# Patient Record
Sex: Female | Born: 1935 | Race: White | Hispanic: No | Smoking: Never smoker
Health system: Southern US, Community
[De-identification: ages and names within clinical notes are randomized; demographics above are authoritative.]

## PROBLEM LIST (undated history)

## (undated) DIAGNOSIS — I160 Hypertensive urgency: Secondary | ICD-10-CM

## (undated) DIAGNOSIS — I350 Nonrheumatic aortic (valve) stenosis: Secondary | ICD-10-CM

## (undated) DIAGNOSIS — R011 Cardiac murmur, unspecified: Secondary | ICD-10-CM

## (undated) DIAGNOSIS — E876 Hypokalemia: Secondary | ICD-10-CM

## (undated) DIAGNOSIS — N390 Urinary tract infection, site not specified: Secondary | ICD-10-CM

## (undated) DIAGNOSIS — S72012A Unspecified intracapsular fracture of left femur, initial encounter for closed fracture: Secondary | ICD-10-CM

## (undated) DIAGNOSIS — E43 Unspecified severe protein-calorie malnutrition: Secondary | ICD-10-CM

## (undated) DIAGNOSIS — I10 Essential (primary) hypertension: Secondary | ICD-10-CM

## (undated) HISTORY — DX: Cardiac murmur, unspecified: R01.1

## (undated) HISTORY — DX: Hypokalemia: E87.6

## (undated) HISTORY — DX: Unspecified severe protein-calorie malnutrition: E43

## (undated) HISTORY — DX: Hypertensive urgency: I16.0

## (undated) HISTORY — DX: Unspecified intracapsular fracture of left femur, initial encounter for closed fracture: S72.012A

---

## 1999-06-29 ENCOUNTER — Inpatient Hospital Stay (HOSPITAL_COMMUNITY): Admission: EM | Admit: 1999-06-29 | Discharge: 1999-07-01 | Payer: Self-pay | Admitting: Emergency Medicine

## 1999-06-29 ENCOUNTER — Encounter: Payer: Self-pay | Admitting: Emergency Medicine

## 1999-06-30 ENCOUNTER — Encounter: Payer: Self-pay | Admitting: Family Medicine

## 1999-07-01 ENCOUNTER — Encounter: Payer: Self-pay | Admitting: Internal Medicine

## 2002-10-02 ENCOUNTER — Encounter: Payer: Self-pay | Admitting: Emergency Medicine

## 2002-10-02 ENCOUNTER — Emergency Department (HOSPITAL_COMMUNITY): Admission: EM | Admit: 2002-10-02 | Discharge: 2002-10-02 | Payer: Self-pay | Admitting: Emergency Medicine

## 2004-01-26 ENCOUNTER — Emergency Department (HOSPITAL_COMMUNITY): Admission: EM | Admit: 2004-01-26 | Discharge: 2004-01-26 | Payer: Self-pay | Admitting: Emergency Medicine

## 2004-01-28 ENCOUNTER — Emergency Department (HOSPITAL_COMMUNITY): Admission: EM | Admit: 2004-01-28 | Discharge: 2004-01-28 | Payer: Self-pay | Admitting: Emergency Medicine

## 2004-02-04 ENCOUNTER — Emergency Department (HOSPITAL_COMMUNITY): Admission: EM | Admit: 2004-02-04 | Discharge: 2004-02-04 | Payer: Self-pay | Admitting: Emergency Medicine

## 2004-05-17 ENCOUNTER — Encounter: Admission: RE | Admit: 2004-05-17 | Discharge: 2004-05-17 | Payer: Self-pay | Admitting: Family Medicine

## 2007-08-05 ENCOUNTER — Emergency Department (HOSPITAL_COMMUNITY): Admission: EM | Admit: 2007-08-05 | Discharge: 2007-08-05 | Payer: Self-pay | Admitting: Emergency Medicine

## 2010-05-01 ENCOUNTER — Emergency Department (HOSPITAL_COMMUNITY): Admission: EM | Admit: 2010-05-01 | Discharge: 2010-05-02 | Payer: Self-pay | Admitting: Emergency Medicine

## 2010-11-28 ENCOUNTER — Encounter: Payer: Self-pay | Admitting: Family Medicine

## 2011-01-23 LAB — URINALYSIS, ROUTINE W REFLEX MICROSCOPIC
Ketones, ur: NEGATIVE mg/dL
Nitrite: NEGATIVE
Protein, ur: NEGATIVE mg/dL
Specific Gravity, Urine: 1.005 (ref 1.005–1.030)
pH: 7.5 (ref 5.0–8.0)

## 2011-01-23 LAB — RAPID URINE DRUG SCREEN, HOSP PERFORMED
Amphetamines: NOT DETECTED
Barbiturates: NOT DETECTED
Cocaine: NOT DETECTED
Opiates: NOT DETECTED

## 2011-01-23 LAB — URINE CULTURE

## 2011-01-23 LAB — URINE MICROSCOPIC-ADD ON: Urine-Other: NONE SEEN

## 2011-11-10 DIAGNOSIS — N3000 Acute cystitis without hematuria: Secondary | ICD-10-CM | POA: Diagnosis not present

## 2012-05-28 DIAGNOSIS — I10 Essential (primary) hypertension: Secondary | ICD-10-CM | POA: Diagnosis not present

## 2012-05-28 DIAGNOSIS — R300 Dysuria: Secondary | ICD-10-CM | POA: Diagnosis not present

## 2012-06-06 DIAGNOSIS — R350 Frequency of micturition: Secondary | ICD-10-CM | POA: Diagnosis not present

## 2012-06-06 DIAGNOSIS — R1013 Epigastric pain: Secondary | ICD-10-CM | POA: Diagnosis not present

## 2012-06-20 ENCOUNTER — Emergency Department (HOSPITAL_COMMUNITY)
Admission: EM | Admit: 2012-06-20 | Discharge: 2012-06-20 | Disposition: A | Discharge status: Home / Self Care | Payer: Medicare Other | Attending: Emergency Medicine | Admitting: Emergency Medicine

## 2012-06-20 ENCOUNTER — Emergency Department (HOSPITAL_COMMUNITY): Payer: Medicare Other

## 2012-06-20 ENCOUNTER — Encounter (HOSPITAL_COMMUNITY): Payer: Self-pay | Admitting: Emergency Medicine

## 2012-06-20 DIAGNOSIS — IMO0002 Reserved for concepts with insufficient information to code with codable children: Secondary | ICD-10-CM | POA: Insufficient documentation

## 2012-06-20 DIAGNOSIS — M25569 Pain in unspecified knee: Secondary | ICD-10-CM | POA: Diagnosis not present

## 2012-06-20 DIAGNOSIS — W010XXA Fall on same level from slipping, tripping and stumbling without subsequent striking against object, initial encounter: Secondary | ICD-10-CM | POA: Insufficient documentation

## 2012-06-20 DIAGNOSIS — S82009A Unspecified fracture of unspecified patella, initial encounter for closed fracture: Secondary | ICD-10-CM | POA: Diagnosis not present

## 2012-06-20 DIAGNOSIS — T07XXXA Unspecified multiple injuries, initial encounter: Secondary | ICD-10-CM

## 2012-06-20 DIAGNOSIS — M25462 Effusion, left knee: Secondary | ICD-10-CM

## 2012-06-20 DIAGNOSIS — M7989 Other specified soft tissue disorders: Secondary | ICD-10-CM | POA: Diagnosis not present

## 2012-06-20 DIAGNOSIS — I10 Essential (primary) hypertension: Secondary | ICD-10-CM | POA: Diagnosis not present

## 2012-06-20 DIAGNOSIS — Y9301 Activity, walking, marching and hiking: Secondary | ICD-10-CM | POA: Insufficient documentation

## 2012-06-20 DIAGNOSIS — Y998 Other external cause status: Secondary | ICD-10-CM | POA: Insufficient documentation

## 2012-06-20 DIAGNOSIS — M25469 Effusion, unspecified knee: Secondary | ICD-10-CM | POA: Diagnosis not present

## 2012-06-20 DIAGNOSIS — R6889 Other general symptoms and signs: Secondary | ICD-10-CM | POA: Diagnosis not present

## 2012-06-20 HISTORY — DX: Essential (primary) hypertension: I10

## 2012-06-20 MED ORDER — LIDOCAINE HCL (PF) 1 % IJ SOLN
5.0000 mL | Freq: Once | INTRAMUSCULAR | Status: AC
  Administered 2012-06-20: 5 mL
  Filled 2012-06-20: qty 5

## 2012-06-20 MED ORDER — ACETAMINOPHEN 325 MG PO TABS
650.0000 mg | ORAL_TABLET | Freq: Once | ORAL | Status: AC
  Administered 2012-06-20: 650 mg via ORAL
  Filled 2012-06-20: qty 2

## 2012-06-20 MED ORDER — HYDROCODONE-ACETAMINOPHEN 5-325 MG PO TABS: 1.0000 | ORAL_TABLET | ORAL | Status: DC | PRN

## 2012-06-20 MED ORDER — DOCUSATE SODIUM 100 MG PO CAPS: 100.0000 mg | ORAL_CAPSULE | Freq: Two times a day (BID) | ORAL | Status: AC

## 2012-06-20 NOTE — Discharge Instructions (Signed)
 Knee Effusion The medical term for having fluid in your knee is effusion. This is often due to an internal derangement of the knee. This means something is wrong inside the knee. Some of the causes of fluid in the knee may be torn cartilage, a torn ligament, or bleeding into the joint from an injury. Your knee is likely more difficult to bend and move. This is often because there is increased pain and pressure in the joint. The time it takes for recovery from a knee effusion depends on different factors, including:   Type of injury.   Your age.   Physical and medical conditions.   Rehabilitation Strategies.  How long you will be away from your normal activities will depend on what kind of knee problem you have and how much damage is present. Your knee has two types of cartilage. Articular cartilage covers the bone ends and lets your knee bend and move smoothly. Two menisci, thick pads of cartilage that form a rim inside the joint, help absorb shock and stabilize your knee. Ligaments bind the bones together and support your knee joint. Muscles move the joint, help support your knee, and take stress off the joint itself. CAUSES  Often an effusion in the knee is caused by an injury to one of the menisci. This is often a tear in the cartilage. Recovery after a meniscus injury depends on how much meniscus is damaged and whether you have damaged other knee tissue. Small tears may heal on their own with conservative treatment. Conservative means rest, limited weight bearing activity and muscle strengthening exercises. Your recovery may take up to 6 weeks.  TREATMENT  Larger tears may require surgery. Meniscus injuries may be treated during arthroscopy. Arthroscopy is a procedure in which your surgeon uses a small telescope like instrument to look in your knee. Your caregiver can make a more accurate diagnosis (learning what is wrong) by performing an arthroscopic procedure. If your injury is on the inner  margin of the meniscus, your surgeon may trim the meniscus back to a smooth rim. In other cases your surgeon will try to repair a damaged meniscus with stitches (sutures). This may make rehabilitation take longer, but may provide better long term result by helping your knee keep its shock absorption capabilities. Ligaments which are completely torn usually require surgery for repair. HOME CARE INSTRUCTIONS  Use crutches as instructed.   If a brace is applied, use as directed.   Once you are home, an ice pack applied to your swollen knee may help with discomfort and help decrease swelling.   Keep your knee raised (elevated) when you are not up and around or on crutches.   Only take over-the-counter or prescription medicines for pain, discomfort, or fever as directed by your caregiver.   Your caregivers will help with instructions for rehabilitation of your knee. This often includes strengthening exercises.   You may resume a normal diet and activities as directed.  SEEK MEDICAL CARE IF:   There is increased swelling in your knee.   You notice redness, swelling, or increasing pain in your knee.   An unexplained oral temperature above 102 F (38.9 C) develops.  SEEK IMMEDIATE MEDICAL CARE IF:   You develop a rash.   You have difficulty breathing.   You have any allergic reactions from medications you may have been given.   There is severe pain with any motion of the knee.  MAKE SURE YOU:   Understand these instructions.  Will watch your condition.   Will get help right away if you are not doing well or get worse.  Document Released: 01/14/2004 Document Revised: 10/13/2011 Document Reviewed: 03/19/2008 [facility] Patient Information 2012 [place], [place].   Patellar Fracture, Adult A patellar fracture is a break in the little round bone (kneecap) that is the bump on the front of your knee. A direct blow to the knee, or fall, is usually the cause of a broken patella. Sometimes,  a very hard and strong bending of the knee (like jumping events in sports) can cause a fracture. Usually the knee is tender and swollen and has pain with motion, especially trying to straighten out the leg. There may be difficulty walking or putting weight on the affected side. These fractures generally heal in about 4 to 6 weeks. DIAGNOSIS  The diagnosis is usually easily made with an exam and x-ray. TREATMENT  Treatment is dependent on the type of fracture:  If the fracture is undisplaced or only slightly displaced (this means the position of the parts is good) then, after any blood in the joint has been removed, and if you can still straighten your leg out, you can usually be treated with a splint or cast for 4 to 6 weeks. Straightening out your leg is called extension and the ability to do this is with the extensor mechanism. Every day while in treatment quadriceps exercises should be performed, or as directed by your caregivers.   If there is a stellate (comminuted) fracture of the patella (this means the patella is in multiple small pieces), the blood in the joint may be removed and if you are able to straighten your leg, you can usually be treated with a splint or cast for 4 to 6 weeks. Sometimes this type of fracture may be treated by removing the patella. You will still have a good knee without a patella. If this is done the knee still will need to be in a plaster cast or splint for the next 4 to 6 weeks.   If the fracture is a displaced transverse fracture and you cannot extend (straighten out your leg), then an operation is required to hold the bony fragments together until they heal. Again a plaster cast or splint is worn until the extension mechanism of the knee is regained. This means the knee is healed and you can straighten out your leg again normally.   Only take over-the-counter or prescription medicines for pain, discomfort, or fever as directed by your caregiver.   Warning: Do not  drive a car or operate a motor vehicle until your caregiver specifically tells you it is safe to do so.  HOME CARE INSTRUCTIONS   You may resume normal diet and activities as directed or allowed.   Keep ice packs (a bag of ice wrapped in a towel) on the knee for twenty minutes, four times per day, for the first two days.   Change dressings if necessary or as directed.   Only take over-the-counter or prescription medicines for pain, discomfort, or fever as directed by your caregiver.   Use crutches as directed and exercise leg as directed.   Keep appointments as directed.  SEEK IMMEDIATE MEDICAL CARE IF :  Redness, swelling, or increasing pain in the knee.   An unexplained oral temperature above 102 F (38.9 C) develops.  Document Released: 07/23/2003 Document Revised: 10/13/2011 Document Reviewed: 06/06/2008 [facility] Patient Information 2012 [place], [place].    Narcotic and benzodiazepine use may cause drowsiness,  slowed breathing or dependence.  Please use with caution and do not drive, operate machinery or watch young children alone while taking them.  Taking combinations of these medications or drinking alcohol will potentiate these effects.  Also constipation can be a problem so be sure to drink water and use stool softeners or laxatives to prevent constipation.

## 2012-06-20 NOTE — ED Provider Notes (Signed)
History   This chart was scribed for Jamie Carlson. Oletta Lamas, MD by Charolett Bumpers . The patient was seen in room TR04C/TR04C. Patient's care was started at 1406.    CSN: [id number]  Arrival date & time 06/20/12  1351   First MD Initiated Contact with Patient 06/20/12 1406      Chief Complaint  Patient presents with  . Fall    (Consider location/radiation/quality/duration/timing/severity/associated sxs/prior treatment) HPI Jamie Carlson is a 78 y.o. female who presents to the Emergency Department complaining of constant, moderate left knee pain with associated swelling after a fall that occurred this morning. Pt reports that she was walking when she tripped over a curb, falling on both knees. Pt reports associated abrasions to hands and knees. Pt denies any LOC. Pt reports that she was able to ambulate after the fall, walked back home when a family friend called EMS. Pt has applied ice packs with minimal relief. Pt denies taking any anticoagulants. Pt has a h/o HTN and reports taking 4 aspirin since this morning.   Past Medical History  Diagnosis Date  . Hypertension     History reviewed. No pertinent past surgical history.  History reviewed. No pertinent family history.  History  Substance Use Topics  . Smoking status: Never Smoker   . Smokeless tobacco: Not on file  . Alcohol Use: No    OB History    Grav Para Term Preterm Abortions TAB SAB Ect Mult Living                  Review of Systems  Constitutional: Negative for fever and chills.  Respiratory: Negative for shortness of breath.   Gastrointestinal: Negative for nausea and vomiting.  Musculoskeletal: Positive for joint swelling and arthralgias.       Left knee pain and swelling.   Skin: Positive for wound.       Abrasions to hands and knees.   Neurological: Negative for weakness.  All other systems reviewed and are negative.    Allergies  Sulfa antibiotics  Home Medications   Current Outpatient Rx    Name Route Sig Dispense Refill  . ASPIRIN EC 81 MG PO TBEC Oral Take 324 mg by mouth daily as needed. For pain    . DOCUSATE SODIUM 100 MG PO CAPS Oral Take 1 capsule (100 mg total) by mouth 2 (two) times daily. 40 capsule 0  . HYDROCODONE-ACETAMINOPHEN 5-325 MG PO TABS Oral Take 1 tablet by mouth every 4 (four) hours as needed for pain. 1-2 tablets po q 6 hours prn moderate to severe pain 20 tablet 0    BP 188/96  Pulse 84  Temp 97 F (36.1 C) (Oral)  Resp 16  SpO2 100%  Physical Exam  Nursing note and vitals reviewed. Constitutional: She is oriented to person, place, and time. She appears well-developed and well-nourished. No distress.  HENT:  Head: Normocephalic and atraumatic.  Eyes: EOM are normal.  Neck: Neck supple. No tracheal deviation present.  Cardiovascular: Normal rate.   Pulmonary/Chest: Effort normal. No respiratory distress.  Musculoskeletal: Normal range of motion. She exhibits edema and tenderness.       Liimited ROM and swelling of left knee with tenderness noted.   Neurological: She is alert and oriented to person, place, and time.  Skin: Skin is warm and dry.       Superficial, small abrasions to hands bilaterally and bilaterally knees. No active bleeding. Band-Aid already applied to left hand.   Psychiatric: She  has a normal mood and affect. Her behavior is normal.    ED Course  ARTHOCENTESIS Date/Time: 06/20/2012 5:16 PM Performed by: Lear Ng. Authorized by: Lear Ng Consent: Verbal consent obtained. Risks and benefits: risks, benefits and alternatives were discussed Patient understanding: patient states understanding of the procedure being performed Patient consent: the patient's understanding of the procedure matches consent given Procedure consent: procedure consent matches procedure scheduled Patient identity confirmed: verbally with patient and arm band Time out: Immediately prior to procedure a "time out" was called to verify the  correct patient, procedure, equipment, support staff and site/side marked as required. Indications: joint swelling  Body area: knee Joint: left knee Local anesthesia used: yes Anesthesia: local infiltration Local anesthetic: lidocaine 1% without epinephrine Anesthetic total: 3 ml Patient sedated: no Preparation: Patient was prepped and draped in the usual sterile fashion. Needle gauge: 18 G Approach: lateral Aspirate: bloody Aspirate amount: 58 ml Patient tolerance: Patient tolerated the procedure well with no immediate complications.   (including critical care time)  DIAGNOSTIC STUDIES: Oxygen Saturation is 100% on room air, normal by my interpretation.    COORDINATION OF CARE:   14:39-Discussed planned course of treatment with the patient, who is agreeable at this time.   14:45-Medication Order: Acetaminophen (Tylenol) tablet 650 mg-once.   15:45-Recheck: Informed pt of imaging results of patellar fracture. Pt states that she lives alone, will call social work to try to arrange some in home assistance.   16:00-Consultation with orthopedic surgery, pt was evaluated in ED and pt is to f/u next week.   17:00-Preformed arthrocentesis on left knee with no immediate complications.    Dg Knee Complete 4 Views Left  06/20/2012  *RADIOLOGY REPORT*  Clinical Data: Knee pain and swelling after fall.  LEFT KNEE - COMPLETE 4+ VIEW  Comparison: None.  Findings: The patient has a nondisplaced fracture through the mid and inferior pole of the patella.  No other fracture is identified. There is a very large joint effusion.  Mild degenerative change is present about the knee.  IMPRESSION: Nondisplaced patellar fracture with an associated large joint effusion.  Original Report Authenticated By: Bernadene Bell. D'ALESSIO, M.D.     1. Patellar fracture   2. Knee effusion, left   3. Multiple abrasions    5:21 PM Dr. Luiz Blare saw pt, will see next week.  Ortho tech to place wrap and also knee  immobilizer.   MDM  I personally performed the services described in this documentation, which was scribed in my presence. The recorded information has been reviewed and considered.   Pt with abrasions to both hands and both knees, however left knee with large effusion, likely hemarthrosis and by plain film non displaced patellar fracture.  Pt will need immobilizer, ortho follow up.  Pt lives alone, but has 2 steps to home, has multiple neighbors who can help pt at home.    Plan is to discuss with Dr. Luiz Blare and with neighbor and possibly social work to assist pt at home.       Jamie Carlson. Londyn Wotton, MD 06/20/12 1721

## 2012-06-20 NOTE — Progress Notes (Signed)
Orthopedic Tech Progress Note Patient Details:  Jamie Carlson 07/20/1918 [id number]  Ortho Devices Type of Ortho Device: Knee Immobilizer Ortho Device/Splint Location: right LE Ortho Device/Splint Interventions: Application   Jeanet Lupe T 06/20/2012, 4:44 PM

## 2012-06-20 NOTE — ED Notes (Signed)
Patient walked 2 blocks to eat then upon walking back tripped fell injury to bilateral knees.  Continued to walk went home and family friend called EMS.  NO loc, bilateral knee abrasion and swelling to left knee.

## 2012-06-20 NOTE — ED Notes (Signed)
Family friend arrived states lives next door to patient and is off of work next couple of days. There are other neighbors that assist patient stated by friend.

## 2012-06-22 NOTE — Progress Notes (Signed)
RECEIVED A CALL FROM THE ED CSW THAT PT HAD BEEN DC'D AND NEEDED SOME ASSISTANCE AT HOME FROM HER PREVIOUS PATELLA FRACTURE AND DC TO HOME WITH AND ACE WRAP AND IMMOBILIZER.  PT WAS DC'D WITH NO SERVICES AND IS HOME ALONE WITH NEIGHBORS AND FRIENDS CHECKING ON HER.  I PLACED A CALL TO HER PCP DR. Clyde Canterbury AT [facility] [address], TO SEE IF HER PCP CAN PLACE AN ORDER FOR HH.  NO RETURN CALL AT THIS TIME.  WILL F/U.

## 2012-06-22 NOTE — Progress Notes (Signed)
SECOND MESSAGE WAS LEFT FOR DR. Delanna Ahmadi OFFICE IN REFERENCE TO SOME HOME HEALTH CARE.  WILL F/U.

## 2013-02-20 DIAGNOSIS — R300 Dysuria: Secondary | ICD-10-CM | POA: Diagnosis not present

## 2013-06-22 ENCOUNTER — Emergency Department (HOSPITAL_COMMUNITY)
Admission: EM | Admit: 2013-06-22 | Discharge: 2013-06-22 | Disposition: A | Discharge status: Home / Self Care | Payer: Medicare Other | Attending: Emergency Medicine | Admitting: Emergency Medicine

## 2013-06-22 ENCOUNTER — Encounter (HOSPITAL_COMMUNITY): Payer: Self-pay | Admitting: Emergency Medicine

## 2013-06-22 DIAGNOSIS — R319 Hematuria, unspecified: Secondary | ICD-10-CM | POA: Diagnosis not present

## 2013-06-22 DIAGNOSIS — Z7982 Long term (current) use of aspirin: Secondary | ICD-10-CM | POA: Diagnosis not present

## 2013-06-22 DIAGNOSIS — I10 Essential (primary) hypertension: Secondary | ICD-10-CM | POA: Diagnosis not present

## 2013-06-22 DIAGNOSIS — Z79899 Other long term (current) drug therapy: Secondary | ICD-10-CM | POA: Insufficient documentation

## 2013-06-22 DIAGNOSIS — N390 Urinary tract infection, site not specified: Secondary | ICD-10-CM | POA: Diagnosis not present

## 2013-06-22 HISTORY — DX: Urinary tract infection, site not specified: N39.0

## 2013-06-22 LAB — COMPREHENSIVE METABOLIC PANEL
AST: 19 U/L (ref 0–37)
Albumin: 3.8 g/dL (ref 3.5–5.2)
Alkaline Phosphatase: 87 U/L (ref 39–117)
Calcium: 10 mg/dL (ref 8.4–10.5)
Chloride: 104 mEq/L (ref 96–112)
Potassium: 3.9 mEq/L (ref 3.5–5.1)
Total Bilirubin: 0.4 mg/dL (ref 0.3–1.2)
Total Protein: 6.4 g/dL (ref 6.0–8.3)

## 2013-06-22 LAB — CBC WITH DIFFERENTIAL/PLATELET
Eosinophils Absolute: 0.2 10*3/uL (ref 0.0–0.7)
Hemoglobin: 14.4 g/dL (ref 12.0–15.0)
Lymphs Abs: 1.6 10*3/uL (ref 0.7–4.0)
MCH: 32 pg (ref 26.0–34.0)
MCHC: 34.8 g/dL (ref 30.0–36.0)

## 2013-06-22 LAB — URINALYSIS, ROUTINE W REFLEX MICROSCOPIC
Bilirubin Urine: NEGATIVE
Glucose, UA: NEGATIVE mg/dL
Ketones, ur: NEGATIVE mg/dL
Protein, ur: 30 mg/dL — AB
Specific Gravity, Urine: 1.008 (ref 1.005–1.030)
Urobilinogen, UA: 1 mg/dL (ref 0.0–1.0)
pH: 6.5 (ref 5.0–8.0)

## 2013-06-22 LAB — URINE MICROSCOPIC-ADD ON

## 2013-06-22 MED ORDER — CEPHALEXIN 500 MG PO CAPS: 500.0000 mg | ORAL_CAPSULE | Freq: Two times a day (BID) | ORAL | Status: DC

## 2013-06-22 NOTE — ED Provider Notes (Signed)
CSN: [id number]     Arrival date & time 06/22/13  1413 History     First MD Initiated Contact with Patient 06/22/13 1414     No chief complaint on file.  (Consider location/radiation/quality/duration/timing/severity/associated sxs/prior Treatment) HPI Comments: Patient is a 79 year old female past medical history significant for hypertension and urinary tract infections presented to the emergency department for 2 episodes of hematuria with associated lower abdominal "firmness." The patient endorses that this presentation feels similar to previous presentations with bladder infections. Patient denies any pain, dysuria, frequency, urgency, diarrhea, constipation. Patient's only abdominal surgical history includes a cesarean section.   Past Medical History  Diagnosis Date  . Hypertension   . Urinary tract infection    Past Surgical History  Procedure Laterality Date  . Cesarean section     History reviewed. No pertinent family history. History  Substance Use Topics  . Smoking status: Never Smoker   . Smokeless tobacco: Not on file  . Alcohol Use: No   OB History   Grav Para Term Preterm Abortions TAB SAB Ect Mult Living                 Review of Systems  Constitutional: Negative for fever.  HENT: Negative.   Eyes: Negative.   Respiratory: Negative for shortness of breath.   Cardiovascular: Negative for chest pain.  Gastrointestinal: Negative for nausea, vomiting, diarrhea and constipation.  Genitourinary: Positive for hematuria. Negative for dysuria.  Musculoskeletal: Negative.   Skin: Negative.   Neurological: Negative.     Allergies  Bee venom and Sulfa antibiotics  Home Medications   Current Outpatient Rx  Name  Route  Sig  Dispense  Refill  . aspirin 325 MG tablet   Oral   Take 325 mg by mouth daily.         . calcium carbonate (TUMS - DOSED IN MG ELEMENTAL CALCIUM) 500 MG chewable tablet   Oral   Chew 1 tablet by mouth daily as needed for heartburn.          . iodine-sodium iodide 2-2.4 % solution   Topical   Apply 1 application topically daily. Apply once a day to lesion right side of face         . cephALEXin (KEFLEX) 500 MG capsule   Oral   Take 1 capsule (500 mg total) by mouth 2 (two) times daily.   14 capsule   0    BP 168/103  Pulse 63  Temp(Src) 97.7 F (36.5 C) (Oral)  Resp 16  Ht 5' (1.524 m)  Wt 90 lb (40.824 kg)  BMI 17.58 kg/m2  SpO2 99% Physical Exam  Constitutional: She is oriented to person, place, and time. She appears well-developed and well-nourished. No distress.  HENT:  Head: Normocephalic and atraumatic.  Right Ear: External ear normal.  Left Ear: External ear normal.  Nose: Nose normal.  Mouth/Throat: Oropharynx is clear and moist.  Eyes: Conjunctivae are normal.  Neck: Normal range of motion. Neck supple.  Cardiovascular: Normal rate, regular rhythm, normal heart sounds and intact distal pulses.   Pulmonary/Chest: Effort normal and breath sounds normal. No respiratory distress.  Abdominal: Soft. Bowel sounds are normal. She exhibits no distension. There is no tenderness.  Musculoskeletal: Normal range of motion. She exhibits no edema.  Neurological: She is alert and oriented to person, place, and time.  Skin: Skin is warm and dry. No rash noted. She is not diaphoretic.    ED Course   Procedures (including critical care  time)  Labs Reviewed  COMPREHENSIVE METABOLIC PANEL - Abnormal; Notable for the following:    GFR calc non Af Amer 74 (*)    GFR calc Af Amer 86 (*)    All other components within normal limits  URINALYSIS, ROUTINE W REFLEX MICROSCOPIC - Abnormal; Notable for the following:    APPearance CLOUDY (*)    Hgb urine dipstick MODERATE (*)    Protein, ur 30 (*)    Leukocytes, UA SMALL (*)    All other components within normal limits  URINE CULTURE  CBC WITH DIFFERENTIAL  URINE MICROSCOPIC-ADD ON   No results found. 1. UTI (urinary tract infection)     MDM  Abdomen  soft, nontender, nondistended with bowel sounds present. Concern for acute abdomen. Patient ambulating in ED without difficulty. Given history of similar presentations for UTIs with this at present patient will be started on Keflex and will urine culture is sent. Pt has been diagnosed with a UTI. Pt is afebrile, no CVA tenderness, normotensive, and denies N/V. No need for imaging at this time. I have personally reviewed all relevant laboratory findings for this patient. Pt to be dc home with instructions to follow up with PCP if symptoms persist. Patient is agreeable to plan. Patient d/w with Dr. Fayrene Fearing, agrees with plan. Patient is stable at time of discharge     Jeannetta Ellis, PA-C 06/22/13 1707

## 2013-06-22 NOTE — ED Notes (Signed)
States sudden onset of lower abdominal firmness, then saw clots of blood after she urinated. Has history of bladder infections, and "at my age, I need to get checked out."

## 2013-06-23 LAB — URINE CULTURE

## 2013-06-25 NOTE — ED Provider Notes (Signed)
Medical screening examination/treatment/procedure(s) were performed by non-physician practitioner and as supervising physician I was immediately available for consultation/collaboration.   Claudean Kinds, MD 06/25/13 [phone]

## 2013-07-15 DIAGNOSIS — R011 Cardiac murmur, unspecified: Secondary | ICD-10-CM | POA: Diagnosis not present

## 2013-07-15 DIAGNOSIS — R079 Chest pain, unspecified: Secondary | ICD-10-CM | POA: Diagnosis not present

## 2013-07-15 DIAGNOSIS — R300 Dysuria: Secondary | ICD-10-CM | POA: Diagnosis not present

## 2016-02-01 ENCOUNTER — Encounter (HOSPITAL_COMMUNITY): Payer: Self-pay

## 2016-02-01 ENCOUNTER — Emergency Department (HOSPITAL_COMMUNITY): Payer: Medicare Other

## 2016-02-01 ENCOUNTER — Emergency Department (HOSPITAL_COMMUNITY)
Admission: EM | Admit: 2016-02-01 | Discharge: 2016-02-01 | Disposition: A | Discharge status: Home / Self Care | Payer: Medicare Other | Attending: Emergency Medicine | Admitting: Emergency Medicine

## 2016-02-01 DIAGNOSIS — S0990XA Unspecified injury of head, initial encounter: Secondary | ICD-10-CM | POA: Diagnosis present

## 2016-02-01 DIAGNOSIS — W010XXA Fall on same level from slipping, tripping and stumbling without subsequent striking against object, initial encounter: Secondary | ICD-10-CM | POA: Insufficient documentation

## 2016-02-01 DIAGNOSIS — Y9389 Activity, other specified: Secondary | ICD-10-CM | POA: Insufficient documentation

## 2016-02-01 DIAGNOSIS — S199XXA Unspecified injury of neck, initial encounter: Secondary | ICD-10-CM | POA: Diagnosis not present

## 2016-02-01 DIAGNOSIS — S0181XA Laceration without foreign body of other part of head, initial encounter: Secondary | ICD-10-CM | POA: Diagnosis not present

## 2016-02-01 DIAGNOSIS — Z87440 Personal history of urinary (tract) infections: Secondary | ICD-10-CM | POA: Diagnosis not present

## 2016-02-01 DIAGNOSIS — S3991XA Unspecified injury of abdomen, initial encounter: Secondary | ICD-10-CM | POA: Diagnosis not present

## 2016-02-01 DIAGNOSIS — Y9289 Other specified places as the place of occurrence of the external cause: Secondary | ICD-10-CM | POA: Insufficient documentation

## 2016-02-01 DIAGNOSIS — I10 Essential (primary) hypertension: Secondary | ICD-10-CM | POA: Insufficient documentation

## 2016-02-01 DIAGNOSIS — S8990XA Unspecified injury of unspecified lower leg, initial encounter: Secondary | ICD-10-CM | POA: Diagnosis not present

## 2016-02-01 DIAGNOSIS — Y998 Other external cause status: Secondary | ICD-10-CM | POA: Diagnosis not present

## 2016-02-01 DIAGNOSIS — S0180XA Unspecified open wound of other part of head, initial encounter: Secondary | ICD-10-CM | POA: Diagnosis not present

## 2016-02-01 NOTE — ED Notes (Signed)
Per EMS- Patient states she was moving furniture and tripped over a rug. Patient has a small laceration to the right forehead. Patient also c/o right knee pain.

## 2016-02-01 NOTE — Progress Notes (Signed)
Pt HOH Hears best on right side- Noted pt without pcp Pt states she has been seen at [facility] [address] [place] Reports her pcp left the office and she has not seen anyone in "two years"  [facility] [place] on 07/15/2013 per Dontae at  [phone]  Pt agreed to see Dr Hyman HopesWebb again to re establish care on 02/08/16 at 2pm Pt had not  Been in the office in 3 years per Dontae Pt at ED nursing station requesting to be d/c home  Pt given extra copy of appt for 02/08/16 at 2pm

## 2016-02-01 NOTE — ED Notes (Signed)
Bed: WA08 Expected date:  Expected time:  Means of arrival:  Comments: EMS- 82yo F, head injury/fall

## 2016-02-01 NOTE — ED Provider Notes (Signed)
CSN: [id number]     Arrival date & time 02/01/16  1234 History   First MD Initiated Contact with Patient 02/01/16 1302     Chief Complaint  Patient presents with  . Fall  . Head Injury  . Knee Pain     (Consider location/radiation/quality/duration/timing/severity/associated sxs/prior Treatment) Patient is a 82 y.o. female presenting with fall, head injury, and knee pain. The history is provided by the patient (The patient fell and hit her head no loss of consciousness. Patient hit her head on furniture).  Fall This is a new problem. The current episode started 3 to 5 hours ago. The problem occurs rarely. The problem has been resolved. Associated symptoms include abdominal pain. Pertinent negatives include no chest pain and no headaches. Nothing aggravates the symptoms. Nothing relieves the symptoms.  Head Injury Associated symptoms: no headaches and no seizures   Knee Pain Associated symptoms: no back pain and no fatigue     Past Medical History  Diagnosis Date  . Hypertension   . Urinary tract infection    Past Surgical History  Procedure Laterality Date  . Cesarean section     Family History  Problem Relation Age of Onset  . Family history unknown: Yes   Social History  Substance Use Topics  . Smoking status: Never Smoker   . Smokeless tobacco: Never Used  . Alcohol Use: No   OB History    No data available     Review of Systems  Constitutional: Negative for appetite change and fatigue.  HENT: Negative for congestion, ear discharge and sinus pressure.        Mild headache  Eyes: Negative for discharge.  Respiratory: Negative for cough.   Cardiovascular: Negative for chest pain.  Gastrointestinal: Positive for abdominal pain. Negative for diarrhea.  Genitourinary: Negative for frequency and hematuria.  Musculoskeletal: Negative for back pain.  Skin: Negative for rash.  Neurological: Negative for seizures and headaches.  Psychiatric/Behavioral: Negative for  hallucinations.      Allergies  Sulfa antibiotics and Bee venom  Home Medications   Prior to Admission medications   Not on File   BP 195/102 mmHg  Pulse 67  Resp 18  SpO2 100% Physical Exam  Constitutional: She is oriented to person, place, and time. She appears well-developed.  HENT:  Head: Normocephalic.  3 cm laceration left fourth  Eyes: Conjunctivae and EOM are normal. No scleral icterus.  Neck: Neck supple. No thyromegaly present.  Cardiovascular: Normal rate and regular rhythm.  Exam reveals no gallop and no friction rub.   No murmur heard. Pulmonary/Chest: No stridor. She has no wheezes. She has no rales. She exhibits no tenderness.  Abdominal: She exhibits no distension. There is no tenderness. There is no rebound.  Musculoskeletal: Normal range of motion. She exhibits no edema.  Lymphadenopathy:    She has no cervical adenopathy.  Neurological: She is oriented to person, place, and time. She exhibits normal muscle tone. Coordination normal.  Skin: No rash noted. No erythema.  Psychiatric: She has a normal mood and affect. Her behavior is normal.    ED Course  .Marland Kitchen.Laceration Repair Date/Time: 02/01/2016 2:46 PM Performed by: Bethann BerkshireZAMMIT, Naijah Lacek Authorized by: Bethann BerkshireZAMMIT, Calyn Sivils Comments: Patient has a 3 cm laceration to left forehead. Area was cleaned thoroughly with Betadine. 8 staples were used to close laceration patient tolerated procedure well   (including critical care time) Labs Review Labs Reviewed - No data to display  Imaging Review Ct Head Wo Contrast  02/01/2016  CLINICAL DATA:  Left forehead laceration after tripping over rug. EXAM: CT HEAD WITHOUT CONTRAST CT CERVICAL SPINE WITHOUT CONTRAST TECHNIQUE: Multidetector CT imaging of the head and cervical spine was performed following the standard protocol without intravenous contrast. Multiplanar CT image reconstructions of the cervical spine were also generated. COMPARISON:  CT scan of May 01, 2010.  FINDINGS: CT HEAD FINDINGS Bony calvarium appears intact. Surgical staples and soft tissue laceration are seen in left frontal region. Mild diffuse cortical atrophy is noted. Mild chronic ischemic white matter disease is noted. No mass effect or midline shift is noted. Ventricular size is within normal limits. There is no evidence of mass lesion, hemorrhage or acute infarction. CT CERVICAL SPINE FINDINGS No fracture or spondylolisthesis is noted. Mild degenerative disc disease is noted at C4-5 and C5-6. Mild degenerative changes seen involving the posterior facet joints of C7-T1. Visualized lung fields appear normal. IMPRESSION: Soft tissue laceration seen in left frontal region. Mild diffuse cortical atrophy. Mild chronic ischemic white matter disease. No acute intracranial abnormality seen. Multilevel degenerative disc disease is noted. No acute abnormality seen in the cervical spine. Electronically Signed   By: Lupita Raider, M.D.   On: 02/01/2016 14:28   Ct Cervical Spine Wo Contrast  02/01/2016  CLINICAL DATA:  Left forehead laceration after tripping over rug. EXAM: CT HEAD WITHOUT CONTRAST CT CERVICAL SPINE WITHOUT CONTRAST TECHNIQUE: Multidetector CT imaging of the head and cervical spine was performed following the standard protocol without intravenous contrast. Multiplanar CT image reconstructions of the cervical spine were also generated. COMPARISON:  CT scan of May 01, 2010. FINDINGS: CT HEAD FINDINGS Bony calvarium appears intact. Surgical staples and soft tissue laceration are seen in left frontal region. Mild diffuse cortical atrophy is noted. Mild chronic ischemic white matter disease is noted. No mass effect or midline shift is noted. Ventricular size is within normal limits. There is no evidence of mass lesion, hemorrhage or acute infarction. CT CERVICAL SPINE FINDINGS No fracture or spondylolisthesis is noted. Mild degenerative disc disease is noted at C4-5 and C5-6. Mild degenerative changes  seen involving the posterior facet joints of C7-T1. Visualized lung fields appear normal. IMPRESSION: Soft tissue laceration seen in left frontal region. Mild diffuse cortical atrophy. Mild chronic ischemic white matter disease. No acute intracranial abnormality seen. Multilevel degenerative disc disease is noted. No acute abnormality seen in the cervical spine. Electronically Signed   By: Lupita Raider, M.D.   On: 02/01/2016 14:28   I have personally reviewed and evaluated these images and lab results as part of my medical decision-making.   EKG Interpretation None      MDM   Final diagnoses:  Head injury, initial encounter    Head injury CT scan head negative. 3 cm laceration to 4) 8 staples patient tolerated the procedure well and will follow-up with her PCP to have staples out in a week    Bethann Berkshire, MD 02/02/16 [phone]

## 2016-02-01 NOTE — Discharge Instructions (Signed)
Have staples removed in one week.

## 2016-02-08 DIAGNOSIS — S0101XD Laceration without foreign body of scalp, subsequent encounter: Secondary | ICD-10-CM | POA: Diagnosis not present

## 2016-02-08 DIAGNOSIS — Z4802 Encounter for removal of sutures: Secondary | ICD-10-CM | POA: Diagnosis not present

## 2016-02-12 ENCOUNTER — Encounter (HOSPITAL_COMMUNITY): Payer: Self-pay | Admitting: Oncology

## 2016-02-12 ENCOUNTER — Emergency Department (HOSPITAL_COMMUNITY)
Admission: EM | Admit: 2016-02-12 | Discharge: 2016-02-12 | Disposition: A | Discharge status: Home / Self Care | Payer: Medicare Other | Attending: Emergency Medicine | Admitting: Emergency Medicine

## 2016-02-12 DIAGNOSIS — I10 Essential (primary) hypertension: Secondary | ICD-10-CM | POA: Diagnosis not present

## 2016-02-12 DIAGNOSIS — S0990XA Unspecified injury of head, initial encounter: Secondary | ICD-10-CM | POA: Diagnosis not present

## 2016-02-12 DIAGNOSIS — S098XXA Other specified injuries of head, initial encounter: Secondary | ICD-10-CM | POA: Diagnosis not present

## 2016-02-12 DIAGNOSIS — Z87440 Personal history of urinary (tract) infections: Secondary | ICD-10-CM | POA: Insufficient documentation

## 2016-02-12 DIAGNOSIS — W01198D Fall on same level from slipping, tripping and stumbling with subsequent striking against other object, subsequent encounter: Secondary | ICD-10-CM | POA: Diagnosis not present

## 2016-02-12 DIAGNOSIS — S0181XD Laceration without foreign body of other part of head, subsequent encounter: Secondary | ICD-10-CM | POA: Diagnosis not present

## 2016-02-12 DIAGNOSIS — Z4802 Encounter for removal of sutures: Secondary | ICD-10-CM | POA: Diagnosis not present

## 2016-02-12 NOTE — ED Notes (Signed)
Per EMS pt fell one week ago and was seen here.  Pt had stiches placed on last visit, today the wound re-opened.  Pt reported falling again today and hitting her head on the arm of a chair.  Pt is A&O x 4.  Denies LOC or pain.  Bleeding currently controlled.

## 2016-02-12 NOTE — ED Notes (Signed)
Bed: WA15 Expected date:  Expected time:  Means of arrival:  Comments: ems 

## 2016-02-12 NOTE — ED Notes (Signed)
Pt. Refused vital signs. RN,Kristen made aware.

## 2016-02-12 NOTE — Discharge Instructions (Signed)
Apply direct pressure if bleeding recurs.

## 2016-02-12 NOTE — ED Provider Notes (Signed)
CSN: [id number]     Arrival date & time 02/12/16  2055 History   First MD Initiated Contact with Patient 02/12/16 2102     Chief Complaint  Patient presents with  . Head Laceration     (Consider location/radiation/quality/duration/timing/severity/associated sxs/prior Treatment) HPI Comments: Patient is a 82 year old female with little past medical history who takes no medications. She was brought by EMS for evaluation of bleeding from her forehead. She was seen 2 weeks ago after a fall and had several staples placed in her upper forehead. These were removed 5 days ago and she was doing well until this evening. Part of the laceration began to bleed. She denies reinjuring it and denies any other trauma. EMS was called and a dressing was placed.  The history is provided by the patient.    Past Medical History  Diagnosis Date  . Hypertension   . Urinary tract infection    Past Surgical History  Procedure Laterality Date  . Cesarean section     Family History  Problem Relation Age of Onset  . Family history unknown: Yes   Social History  Substance Use Topics  . Smoking status: Never Smoker   . Smokeless tobacco: Never Used  . Alcohol Use: No   OB History    No data available     Review of Systems  All other systems reviewed and are negative.     Allergies  Sulfa antibiotics and Bee venom  Home Medications   Prior to Admission medications   Not on File   There were no vitals taken for this visit. Physical Exam  Constitutional: She is oriented to person, place, and time. She appears well-developed and well-nourished. No distress.  HENT:  Head: Normocephalic.  There is a 2.5 cm laceration to the left to the left upper forehead that appears to be healing well. One and of the laceration appears to have separated, however bleeding is now controlled.  Eyes: EOM are normal. Pupils are equal, round, and reactive to light.  Neck: Normal range of motion. Neck supple.   Neurological: She is alert and oriented to person, place, and time.  Skin: Skin is warm and dry. She is not diaphoretic.  Nursing note and vitals reviewed.   ED Course  Procedures (including critical care time) Labs Review Labs Reviewed - No data to display  Imaging Review No results found. I have personally reviewed and evaluated these images and lab results as part of my medical decision-making.   EKG Interpretation None      MDM   Final diagnoses:  None    Bleeding controlled by EMS. Dressing will be applied she will be discharged to home. The return as needed for any problems.    Geoffery Lyonsouglas Kainon Varady, MD 02/12/16 2116

## 2016-02-12 NOTE — ED Notes (Signed)
Pt's son is on his way here

## 2016-09-12 DIAGNOSIS — R319 Hematuria, unspecified: Secondary | ICD-10-CM | POA: Diagnosis not present

## 2016-09-12 DIAGNOSIS — N3001 Acute cystitis with hematuria: Secondary | ICD-10-CM | POA: Diagnosis not present

## 2016-12-10 DIAGNOSIS — R1084 Generalized abdominal pain: Secondary | ICD-10-CM | POA: Diagnosis not present

## 2016-12-10 DIAGNOSIS — K5900 Constipation, unspecified: Secondary | ICD-10-CM | POA: Diagnosis not present

## 2017-01-21 ENCOUNTER — Emergency Department (HOSPITAL_COMMUNITY): Payer: Medicare Other

## 2017-01-21 ENCOUNTER — Inpatient Hospital Stay (HOSPITAL_COMMUNITY)
Admission: EM | Admit: 2017-01-21 | Discharge: 2017-01-25 | DRG: 480 | Disposition: A | Discharge status: Skilled Nursing Facility | Payer: Medicare Other | Attending: Internal Medicine | Admitting: Internal Medicine

## 2017-01-21 ENCOUNTER — Encounter (HOSPITAL_COMMUNITY): Payer: Self-pay | Admitting: Nurse Practitioner

## 2017-01-21 DIAGNOSIS — E876 Hypokalemia: Secondary | ICD-10-CM | POA: Diagnosis present

## 2017-01-21 DIAGNOSIS — D62 Acute posthemorrhagic anemia: Secondary | ICD-10-CM | POA: Diagnosis not present

## 2017-01-21 DIAGNOSIS — T148XXA Other injury of unspecified body region, initial encounter: Secondary | ICD-10-CM | POA: Diagnosis not present

## 2017-01-21 DIAGNOSIS — Z681 Body mass index (BMI) 19 or less, adult: Secondary | ICD-10-CM

## 2017-01-21 DIAGNOSIS — N3090 Cystitis, unspecified without hematuria: Secondary | ICD-10-CM | POA: Diagnosis not present

## 2017-01-21 DIAGNOSIS — Z8781 Personal history of (healed) traumatic fracture: Secondary | ICD-10-CM

## 2017-01-21 DIAGNOSIS — M810 Age-related osteoporosis without current pathological fracture: Secondary | ICD-10-CM | POA: Diagnosis present

## 2017-01-21 DIAGNOSIS — M25552 Pain in left hip: Secondary | ICD-10-CM | POA: Diagnosis not present

## 2017-01-21 DIAGNOSIS — R531 Weakness: Secondary | ICD-10-CM | POA: Diagnosis present

## 2017-01-21 DIAGNOSIS — W19XXXA Unspecified fall, initial encounter: Secondary | ICD-10-CM

## 2017-01-21 DIAGNOSIS — I160 Hypertensive urgency: Secondary | ICD-10-CM | POA: Diagnosis present

## 2017-01-21 DIAGNOSIS — S72012A Unspecified intracapsular fracture of left femur, initial encounter for closed fracture: Secondary | ICD-10-CM | POA: Diagnosis present

## 2017-01-21 DIAGNOSIS — H9190 Unspecified hearing loss, unspecified ear: Secondary | ICD-10-CM | POA: Diagnosis present

## 2017-01-21 DIAGNOSIS — W010XXA Fall on same level from slipping, tripping and stumbling without subsequent striking against object, initial encounter: Secondary | ICD-10-CM | POA: Diagnosis present

## 2017-01-21 DIAGNOSIS — I10 Essential (primary) hypertension: Secondary | ICD-10-CM | POA: Diagnosis present

## 2017-01-21 DIAGNOSIS — S72042A Displaced fracture of base of neck of left femur, initial encounter for closed fracture: Secondary | ICD-10-CM | POA: Diagnosis not present

## 2017-01-21 DIAGNOSIS — E43 Unspecified severe protein-calorie malnutrition: Secondary | ICD-10-CM | POA: Insufficient documentation

## 2017-01-21 DIAGNOSIS — R011 Cardiac murmur, unspecified: Secondary | ICD-10-CM | POA: Diagnosis present

## 2017-01-21 DIAGNOSIS — S70922A Unspecified superficial injury of left thigh, initial encounter: Secondary | ICD-10-CM | POA: Diagnosis not present

## 2017-01-21 DIAGNOSIS — Z79899 Other long term (current) drug therapy: Secondary | ICD-10-CM

## 2017-01-21 DIAGNOSIS — Z91030 Bee allergy status: Secondary | ICD-10-CM

## 2017-01-21 DIAGNOSIS — Z882 Allergy status to sulfonamides status: Secondary | ICD-10-CM

## 2017-01-21 DIAGNOSIS — S8992XA Unspecified injury of left lower leg, initial encounter: Secondary | ICD-10-CM | POA: Diagnosis not present

## 2017-01-21 DIAGNOSIS — Z66 Do not resuscitate: Secondary | ICD-10-CM | POA: Diagnosis present

## 2017-01-21 LAB — CBC WITH DIFFERENTIAL/PLATELET
BASOS ABS: 0 10*3/uL (ref 0.0–0.1)
BASOS PCT: 0 %
EOS ABS: 0.1 10*3/uL (ref 0.0–0.7)
Eosinophils Relative: 1 %
HEMATOCRIT: 40.8 % (ref 36.0–46.0)
HEMOGLOBIN: 13.9 g/dL (ref 12.0–15.0)
Lymphocytes Relative: 9 %
Lymphs Abs: 1.1 10*3/uL (ref 0.7–4.0)
MCH: 30.5 pg (ref 26.0–34.0)
MCHC: 34.1 g/dL (ref 30.0–36.0)
MCV: 89.7 fL (ref 78.0–100.0)
MONOS PCT: 5 %
Monocytes Absolute: 0.6 10*3/uL (ref 0.1–1.0)
NEUTROS ABS: 10.7 10*3/uL — AB (ref 1.7–7.7)
NEUTROS PCT: 85 %
Platelets: 259 10*3/uL (ref 150–400)
RBC: 4.55 MIL/uL (ref 3.87–5.11)
RDW: 13.3 % (ref 11.5–15.5)
WBC: 12.5 10*3/uL — AB (ref 4.0–10.5)

## 2017-01-21 LAB — COMPREHENSIVE METABOLIC PANEL
ALBUMIN: 4.3 g/dL (ref 3.5–5.0)
ALK PHOS: 83 U/L (ref 38–126)
ALT: 21 U/L (ref 14–54)
ANION GAP: 10 (ref 5–15)
AST: 24 U/L (ref 15–41)
BUN: 14 mg/dL (ref 6–20)
CHLORIDE: 104 mmol/L (ref 101–111)
CO2: 25 mmol/L (ref 22–32)
Calcium: 9.5 mg/dL (ref 8.9–10.3)
Creatinine, Ser: 0.7 mg/dL (ref 0.44–1.00)
GFR calc Af Amer: >60 mL/min (ref >60)
GFR calc non Af Amer: >60 mL/min (ref >60)
GLUCOSE: 112 mg/dL — AB (ref 65–99)
Potassium: 3.3 mmol/L — ABNORMAL LOW (ref 3.5–5.1)
SODIUM: 139 mmol/L (ref 135–145)
Total Bilirubin: 0.8 mg/dL (ref 0.3–1.2)
Total Protein: 7.4 g/dL (ref 6.5–8.1)

## 2017-01-21 LAB — URINALYSIS, ROUTINE W REFLEX MICROSCOPIC
BILIRUBIN URINE: NEGATIVE
Bacteria, UA: NONE SEEN
GLUCOSE, UA: NEGATIVE mg/dL
KETONES UR: NEGATIVE mg/dL
NITRITE: NEGATIVE
PROTEIN: NEGATIVE mg/dL
Specific Gravity, Urine: 1.004 — ABNORMAL LOW (ref 1.005–1.030)
Squamous Epithelial / LPF: NONE SEEN
pH: 6 (ref 5.0–8.0)

## 2017-01-21 LAB — PROTIME-INR
INR: 0.95
Prothrombin Time: 12.6 seconds (ref 11.4–15.2)

## 2017-01-21 MED ORDER — FENTANYL CITRATE (PF) 100 MCG/2ML IJ SOLN
25.0000 ug | Freq: Once | INTRAMUSCULAR | Status: AC
  Administered 2017-01-21: 25 ug via INTRAVENOUS
  Filled 2017-01-21: qty 2

## 2017-01-21 MED ORDER — DEXTROSE 5 % IV SOLN
1.0000 g | Freq: Once | INTRAVENOUS | Status: AC
  Administered 2017-01-21: 1 g via INTRAVENOUS
  Filled 2017-01-21: qty 10

## 2017-01-21 MED ORDER — POTASSIUM CHLORIDE CRYS ER 20 MEQ PO TBCR
40.0000 meq | EXTENDED_RELEASE_TABLET | Freq: Once | ORAL | Status: AC
  Administered 2017-01-22: 40 meq via ORAL
  Filled 2017-01-21: qty 2

## 2017-01-21 MED ORDER — SODIUM CHLORIDE 0.9 % IV SOLN
Freq: Once | INTRAVENOUS | Status: AC
  Administered 2017-01-21: 23:00:00 via INTRAVENOUS

## 2017-01-21 NOTE — ED Notes (Signed)
There is a female individual at patients bedside who introduced himself as "son." He has demanded pt "not to go to X-Ray." States "nothing is wrong with you mom, have you been drinking?" he continues in a somewhat hostile manner "mom, why the (insert F profanity) did you have to come to the hospital? You know there is nothing wrong with you, right?" He further States "can you walk? How did you get up after you fell?" "If you can walk we are going home." I explained to him that pt had worrisome signs of a hip injury and she would benefit from an x-ray. I pointed out the left leg shortening and rotation, to which the visitor states "mom you probably have a muscle sprain." He however agrees to the X-ray stating, "if they results do not take long to come back." I have notified the attending EDP of these. He has offered to go talk to the individual post imaging results.

## 2017-01-21 NOTE — ED Notes (Signed)
Blood draw delayed, pt and family refused blood draw.  RN and EDP notified.

## 2017-01-21 NOTE — ED Notes (Signed)
Bed: [id number] Expected date:  Expected time:  Means of arrival:  Comments: 83 yo F/ Fall- Right Hip

## 2017-01-21 NOTE — ED Triage Notes (Signed)
Pt is presented from home by medics who report that that pt had an unwitnessed fall that she, pt who is AOx4 but hard of hearing, reports she "tripped and fell." She exhibiting some degree of discomfort on her left leg evidenced by guarding and splinting although she is dying pain. Plain is elicited with passive ROM to the left leg.

## 2017-01-21 NOTE — ED Provider Notes (Signed)
[facility]-EMERGENCY DEPT Provider Note   CSN: [id number] Arrival date & time: 01/21/17  2040     History   Chief Complaint Chief Complaint  Patient presents with  . Fall    HPI Jamie Carlson is a 83 y.o. female.  HPI 59 old female with no significant past medical history, not on any medications, who presents with left hip pain after mechanical fall. The patient states she was walking around her house today. She turned around to go back to her living room, when she tripped. She fell onto her left hip. She reports immediate onset of aching, throbbing, left hip and Carlson pain. She has been unable to walk since the fall. She denies any head injury or loss of consciousness. She called EMS. After they were able to get her up, she reportedly did not want to come to the hospital, but she is here because she has been unable to ambulate.  Pain worse with any movement or palpation.  Past Medical History:  Diagnosis Date  . Hypertension   . Urinary tract infection     Patient Active Problem List   Diagnosis Date Noted  . Essential hypertension 01/22/2017  . Hypertensive urgency 01/22/2017  . Hypokalemia 01/22/2017  . Closed subcapital fracture of femur, left, initial encounter (HCC) 01/22/2017  . Cardiac murmur 01/22/2017  . Cystitis   . Fall     Past Surgical History:  Procedure Laterality Date  . CESAREAN SECTION      OB History    No data available       Home Medications    Prior to Admission medications   Not on File    Family History Family History  Problem Relation Age of Onset  . Family history unknown: Yes    Social History Social History  Substance Use Topics  . Smoking status: Never Smoker  . Smokeless tobacco: Never Used  . Alcohol use No     Allergies   Sulfa antibiotics and Bee venom   Review of Systems Review of Systems  Constitutional: Negative for chills, diaphoresis and fever.  HENT: Negative for congestion, rhinorrhea and sore throat.     Eyes: Negative for visual disturbance.  Respiratory: Negative for cough, shortness of breath and wheezing.   Cardiovascular: Negative for chest pain and leg swelling.  Gastrointestinal: Negative for abdominal pain, diarrhea, nausea and vomiting.  Genitourinary: Negative for dysuria, flank pain, vaginal bleeding and vaginal discharge.  Musculoskeletal: Positive for arthralgias and gait problem. Negative for neck pain.  Skin: Negative for rash.  Allergic/Immunologic: Negative for immunocompromised state.  Neurological: Negative for syncope and headaches.  Hematological: Does not bruise/bleed easily.  All other systems reviewed and are negative.    Physical Exam Updated Vital Signs BP (!) 166/90 (BP Location: Left Arm)   Pulse 81   Temp 98.2 F (36.8 C) (Oral)   Resp 18   SpO2 94%   Physical Exam  Constitutional: She is oriented to person, place, and time. She appears well-developed and well-nourished. No distress.  HENT:  Head: Normocephalic and atraumatic.  Eyes: Conjunctivae are normal.  Neck: Neck supple.  Cardiovascular: Normal rate, regular rhythm and normal heart sounds.  Exam reveals no friction rub.   No murmur heard. Pulmonary/Chest: Effort normal and breath sounds normal. No respiratory distress. She has no wheezes. She has no rales.  Abdominal: She exhibits no distension.  Musculoskeletal: She exhibits no edema.  Neurological: She is alert and oriented to person, place, and time. She exhibits normal  muscle tone.  Skin: Skin is warm. Capillary refill takes less than 2 seconds.  Psychiatric: She has a normal mood and affect.  Nursing note and vitals reviewed.   LOWER EXTREMITY EXAM: LEFT  INSPECTION & PALPATION: Significant TTP over greater trochanter, anterior femur and Carlson. No deformity or bruising. No open wounds.   SENSORY: sensation is intact to light touch in:  Superficial peroneal nerve distribution (over dorsum of foot) Deep peroneal nerve distribution  (over first dorsal web space) Sural nerve distribution (over lateral aspect 5th metatarsal) Saphenous nerve distribution (over medial instep)  MOTOR:  + Motor EHL (great toe dorsiflexion) + FHL (great toe plantar flexion)  + TA (ankle dorsiflexion)  + GSC (ankle plantar flexion)  VASCULAR: 2+ dorsalis pedis and posterior tibialis pulses Capillary refill < 2 sec, toes warm and well-perfused  COMPARTMENTS: Soft, warm, well-perfused No pain with passive extension No parethesias   ED Treatments / Results  Labs (all labs ordered are listed, but only abnormal results are displayed) Labs Reviewed  SURGICAL PCR SCREEN - Abnormal; Notable for the following:       Result Value   MRSA, PCR POSITIVE (*)    Staphylococcus aureus POSITIVE (*)    All other components within normal limits  CBC WITH DIFFERENTIAL/PLATELET - Abnormal; Notable for the following:    WBC 12.5 (*)    Neutro Abs 10.7 (*)    All other components within normal limits  COMPREHENSIVE METABOLIC PANEL - Abnormal; Notable for the following:    Potassium 3.3 (*)    Glucose, Bld 112 (*)    All other components within normal limits  URINALYSIS, ROUTINE W REFLEX MICROSCOPIC - Abnormal; Notable for the following:    Color, Urine STRAW (*)    Specific Gravity, Urine 1.004 (*)    Hgb urine dipstick MODERATE (*)    Leukocytes, UA SMALL (*)    All other components within normal limits  URINE CULTURE  PROTIME-INR  CBC  MAGNESIUM  TYPE AND SCREEN  ABO/RH  TYPE AND SCREEN  ABO/RH    EKG  EKG Interpretation None       Radiology Dg Chest 2 View  Result Date: 01/21/2017 CLINICAL DATA:  LEFT femoral neck fracture EXAM: CHEST  2 VIEW COMPARISON:  None. FINDINGS: Normal cardiac silhouette. Lungs are hyperinflated. No effusion, infiltrate pneumothorax. IMPRESSION: Hyperinflated lungs without acute findings. Electronically Signed   By: Genevive Bi M.D.   On: 01/21/2017 21:56   Dg Carlson 2 Views Left  Result Date:  01/21/2017 CLINICAL DATA:  Fall-left leg injury. Pt is presented from home by medics who report that that pt had an unwitnessed fall that she, pt who is AOx4 but hard of hearing, reports she "tripped and fell." She exhibiting some degree of discomfort EXAM: LEFT Carlson - 1-2 VIEW COMPARISON:  None. FINDINGS: No fracture of the proximal tibia or distal femur. Patella is normal. No joint effusion. IMPRESSION: No fracture or dislocation. Electronically Signed   By: Genevive Bi M.D.   On: 01/21/2017 21:53   Dg Hip Unilat W Or Wo Pelvis 2-3 Views Left  Result Date: 01/21/2017 CLINICAL DATA:  Fall, discomfort. EXAM: DG HIP (WITH OR WITHOUT PELVIS) 2-3V LEFT COMPARISON:  None. FINDINGS: There is a fracture of the LEFT femoral neck with shortening. Fracture is in a subcapital location. Hip joint remains intact. IMPRESSION: Subcapital fracture of the LEFT femoral neck with shortening. Electronically Signed   By: Genevive Bi M.D.   On: 01/21/2017 21:55  Dg Femur Min 2 Views Left  Result Date: 01/21/2017 CLINICAL DATA:  Fall-left leg injury. Pt is presented from home by medics who report that that pt had an unwitnessed fall that she, pt who is AOx4 but hard of hearing, reports she "tripped and fell." She exhibiting some degree of discomfort on h.*comment was truncated* EXAM: LEFT FEMUR 2 VIEWS COMPARISON:  None. FINDINGS: Limited view of the distal LEFT femur demonstrates no fracture. Proximal femur not imaged. IMPRESSION: No fracture distal femur. Electronically Signed   By: Genevive Bi M.D.   On: 01/21/2017 21:53    Procedures Procedures (including critical care time)  Medications Ordered in ED Medications  0.9 %  sodium chloride infusion ( Intravenous New Bag/Given 01/22/17 0229)  ondansetron (ZOFRAN) injection 4 mg (not administered)  hydrALAZINE (APRESOLINE) injection 5 mg (not administered)  HYDROcodone-acetaminophen (NORCO/VICODIN) 5-325 MG per tablet 1-2 tablet (2 tablets Oral Given  01/22/17 0841)  enoxaparin (LOVENOX) injection 40 mg (not administered)  polyethylene glycol (MIRALAX / GLYCOLAX) packet 17 g (not administered)  bisacodyl (DULCOLAX) suppository 10 mg (not administered)  HYDROmorphone (DILAUDID) injection 0.5 mg (0.5 mg Intravenous Given 01/22/17 0537)  chlorhexidine (HIBICLENS) 4 % liquid 4 application (not administered)  povidone-iodine 10 % swab 2 application (not administered)  ceFAZolin (ANCEF) IVPB 2g/100 mL premix (not administered)  fentaNYL (SUBLIMAZE) injection 25 mcg (25 mcg Intravenous Given 01/21/17 2250)  0.9 %  sodium chloride infusion ( Intravenous Stopped 01/22/17 0049)  cefTRIAXone (ROCEPHIN) 1 g in dextrose 5 % 50 mL IVPB (0 g Intravenous Stopped 01/22/17 0028)  potassium chloride SA (K-DUR,KLOR-CON) CR tablet 40 mEq (40 mEq Oral Given 01/22/17 0004)     Initial Impression / Assessment and Plan / ED Course  I have reviewed the triage vital signs and the nursing notes.  Pertinent labs & imaging results that were available during my care of the patient were reviewed by me and considered in my medical decision making (see chart for details).    83 yo F here with left hip pain s/p mechanical fall. Plain fils show subcapital femur fx. No blood thinner use. Pt denies head injury and there is no other apparent trauma. Distal NV is intact. Otherwise, screening labs show mild leukocytosis, UA c/w UTI - will give rocephin. No signs of sepsis. Lytes WNL. D/w Dr. [facility] - will admit to hospitalist, transfer to [facility] for OR in AM. IVF given.  Final Clinical Impressions(s) / ED Diagnoses   Final diagnoses:  Fall, initial encounter  Closed subcapital fracture of femur, left, initial encounter (HCC)  Cystitis      Shaune Pollack, MD 01/22/17 1130

## 2017-01-22 ENCOUNTER — Inpatient Hospital Stay (HOSPITAL_COMMUNITY): Payer: Medicare Other

## 2017-01-22 ENCOUNTER — Encounter (HOSPITAL_COMMUNITY)
Admission: EM | Disposition: A | Discharge status: Skilled Nursing Facility | Payer: Self-pay | Source: Home / Self Care | Attending: Internal Medicine

## 2017-01-22 ENCOUNTER — Inpatient Hospital Stay (HOSPITAL_COMMUNITY): Payer: Medicare Other | Admitting: Anesthesiology

## 2017-01-22 ENCOUNTER — Encounter (HOSPITAL_COMMUNITY): Payer: Self-pay | Admitting: Family Medicine

## 2017-01-22 DIAGNOSIS — N3090 Cystitis, unspecified without hematuria: Secondary | ICD-10-CM | POA: Diagnosis not present

## 2017-01-22 DIAGNOSIS — Z66 Do not resuscitate: Secondary | ICD-10-CM | POA: Diagnosis present

## 2017-01-22 DIAGNOSIS — Z8781 Personal history of (healed) traumatic fracture: Secondary | ICD-10-CM | POA: Diagnosis not present

## 2017-01-22 DIAGNOSIS — Z4789 Encounter for other orthopedic aftercare: Secondary | ICD-10-CM | POA: Diagnosis not present

## 2017-01-22 DIAGNOSIS — S728X9A Other fracture of unspecified femur, initial encounter for closed fracture: Secondary | ICD-10-CM | POA: Diagnosis not present

## 2017-01-22 DIAGNOSIS — R531 Weakness: Secondary | ICD-10-CM | POA: Diagnosis present

## 2017-01-22 DIAGNOSIS — R296 Repeated falls: Secondary | ICD-10-CM | POA: Diagnosis not present

## 2017-01-22 DIAGNOSIS — R011 Cardiac murmur, unspecified: Secondary | ICD-10-CM | POA: Diagnosis not present

## 2017-01-22 DIAGNOSIS — S72012A Unspecified intracapsular fracture of left femur, initial encounter for closed fracture: Secondary | ICD-10-CM | POA: Diagnosis present

## 2017-01-22 DIAGNOSIS — Z91030 Bee allergy status: Secondary | ICD-10-CM | POA: Diagnosis not present

## 2017-01-22 DIAGNOSIS — S72012S Unspecified intracapsular fracture of left femur, sequela: Secondary | ICD-10-CM | POA: Diagnosis not present

## 2017-01-22 DIAGNOSIS — I10 Essential (primary) hypertension: Secondary | ICD-10-CM | POA: Diagnosis present

## 2017-01-22 DIAGNOSIS — I361 Nonrheumatic tricuspid (valve) insufficiency: Secondary | ICD-10-CM

## 2017-01-22 DIAGNOSIS — S72002A Fracture of unspecified part of neck of left femur, initial encounter for closed fracture: Secondary | ICD-10-CM | POA: Diagnosis not present

## 2017-01-22 DIAGNOSIS — R2689 Other abnormalities of gait and mobility: Secondary | ICD-10-CM | POA: Diagnosis not present

## 2017-01-22 DIAGNOSIS — G8911 Acute pain due to trauma: Secondary | ICD-10-CM | POA: Diagnosis not present

## 2017-01-22 DIAGNOSIS — R278 Other lack of coordination: Secondary | ICD-10-CM | POA: Diagnosis not present

## 2017-01-22 DIAGNOSIS — D62 Acute posthemorrhagic anemia: Secondary | ICD-10-CM | POA: Diagnosis not present

## 2017-01-22 DIAGNOSIS — H9190 Unspecified hearing loss, unspecified ear: Secondary | ICD-10-CM | POA: Diagnosis present

## 2017-01-22 DIAGNOSIS — I160 Hypertensive urgency: Secondary | ICD-10-CM | POA: Diagnosis present

## 2017-01-22 DIAGNOSIS — M810 Age-related osteoporosis without current pathological fracture: Secondary | ICD-10-CM | POA: Diagnosis not present

## 2017-01-22 DIAGNOSIS — E876 Hypokalemia: Secondary | ICD-10-CM | POA: Diagnosis present

## 2017-01-22 DIAGNOSIS — Z681 Body mass index (BMI) 19 or less, adult: Secondary | ICD-10-CM | POA: Diagnosis not present

## 2017-01-22 DIAGNOSIS — S72042A Displaced fracture of base of neck of left femur, initial encounter for closed fracture: Secondary | ICD-10-CM | POA: Diagnosis not present

## 2017-01-22 DIAGNOSIS — W19XXXA Unspecified fall, initial encounter: Secondary | ICD-10-CM

## 2017-01-22 DIAGNOSIS — E43 Unspecified severe protein-calorie malnutrition: Secondary | ICD-10-CM | POA: Diagnosis not present

## 2017-01-22 DIAGNOSIS — S72092A Other fracture of head and neck of left femur, initial encounter for closed fracture: Secondary | ICD-10-CM | POA: Diagnosis not present

## 2017-01-22 DIAGNOSIS — T148XXA Other injury of unspecified body region, initial encounter: Secondary | ICD-10-CM | POA: Diagnosis not present

## 2017-01-22 DIAGNOSIS — W010XXA Fall on same level from slipping, tripping and stumbling without subsequent striking against object, initial encounter: Secondary | ICD-10-CM | POA: Diagnosis present

## 2017-01-22 DIAGNOSIS — Z79899 Other long term (current) drug therapy: Secondary | ICD-10-CM | POA: Diagnosis not present

## 2017-01-22 DIAGNOSIS — Z882 Allergy status to sulfonamides status: Secondary | ICD-10-CM | POA: Diagnosis not present

## 2017-01-22 HISTORY — PX: HIP PINNING,CANNULATED: SHX1758

## 2017-01-22 LAB — ECHOCARDIOGRAM COMPLETE
AOPV: 0.51 m/s
AV Area VTI index: 0.87 cm2/m2
AV Mean grad: 13 mmHg
AV VEL mean LVOT/AV: 0.5
AV area mean vel ind: 0.76 cm2/m2
AV peak Index: 0.77
AV pk vel: 247 cm/s
AVAREAMEANV: 1.01 cm2
AVAREAVTI: 1.03 cm2
AVPG: 24 mmHg
CHL CUP AV VEL: 1.16
CHL CUP DOP CALC LVOT VTI: 28.8 cm
CHL CUP LVOT MV VTI INDEX: 0.95 cm2/m2
CHL CUP LVOT MV VTI: 1.27
CHL CUP MV DEC (S): 246
CHL CUP PV REG GRAD DIAS: 6 mmHg
CHL CUP REG VEL DIAS: 124 cm/s
CHL CUP TV REG PEAK VELOCITY: 368 cm/s
DOP CAL AO MEAN VELOCITY: 165 cm/s
E decel time: 246 msec
EERAT: 26.72
FS: 45 % — AB (ref 28–44)
IVS/LV PW RATIO, ED: 0.95
LA ID, A-P, ES: 27 mm
LA diam index: 2.03 cm/m2
LA vol index: 23.5 mL/m2
LA vol: 31.3 mL
LAVOLA4C: 28.5 mL
LDCA: 2.01 cm2
LEFT ATRIUM END SYS DIAM: 27 mm
LV E/e'average: 26.72
LV e' LATERAL: 3.48 cm/s
LVEEMED: 26.72
LVOT SV: 58 mL
LVOT peak VTI: 0.58 cm
LVOT peak grad rest: 6 mmHg
LVOT peak vel: 126 cm/s
LVOTD: 16 mm
Lateral S' vel: 17 cm/s
MV Annulus VTI: 45.5 cm
MV M vel: 85.5
MV Peak grad: 3 mmHg
MV pk A vel: 157 m/s
MVPKEVEL: 93 m/s
Mean grad: 4 mmHg
PW: 9.08 mm — AB (ref 0.6–1.1)
RV sys press: 62 mmHg
TAPSE: 19.6 mm
TDI e' lateral: 3.48
TDI e' medial: 4.24
TR max vel: 368 cm/s
VTI: 49.9 cm
Valve area index: 0.87
Valve area: 1.16 cm2

## 2017-01-22 LAB — SURGICAL PCR SCREEN
MRSA, PCR: POSITIVE — AB
STAPHYLOCOCCUS AUREUS: POSITIVE — AB

## 2017-01-22 LAB — ABO/RH
ABO/RH(D): O NEG
ABO/RH(D): O NEG

## 2017-01-22 LAB — TYPE AND SCREEN
ABO/RH(D): O NEG
ABO/RH(D): O NEG
ANTIBODY SCREEN: NEGATIVE
ANTIBODY SCREEN: NEGATIVE

## 2017-01-22 SURGERY — FIXATION, FEMUR, NECK, PERCUTANEOUS, USING SCREW
Anesthesia: Spinal | Site: Leg Upper | Laterality: Left

## 2017-01-22 MED ORDER — PROPOFOL 10 MG/ML IV BOLUS
INTRAVENOUS | Status: DC | PRN
  Administered 2017-01-22: 5 mg via INTRAVENOUS
  Administered 2017-01-22: 20 mg via INTRAVENOUS
  Administered 2017-01-22 (×3): 5 mg via INTRAVENOUS

## 2017-01-22 MED ORDER — HYDROCODONE-ACETAMINOPHEN 5-325 MG PO TABS
1.0000 | ORAL_TABLET | Freq: Four times a day (QID) | ORAL | Status: DC | PRN
  Administered 2017-01-22: 2 via ORAL
  Filled 2017-01-22: qty 2

## 2017-01-22 MED ORDER — LACTATED RINGERS IV SOLN
INTRAVENOUS | Status: DC | PRN
  Administered 2017-01-22: 15:00:00 via INTRAVENOUS

## 2017-01-22 MED ORDER — SODIUM CHLORIDE 0.9 % IV SOLN
INTRAVENOUS | Status: AC
  Administered 2017-01-22: 02:00:00 via INTRAVENOUS

## 2017-01-22 MED ORDER — CEFAZOLIN SODIUM-DEXTROSE 2-4 GM/100ML-% IV SOLN
2.0000 g | Freq: Four times a day (QID) | INTRAVENOUS | Status: AC
  Administered 2017-01-22 – 2017-01-23 (×2): 2 g via INTRAVENOUS
  Filled 2017-01-22 (×2): qty 100

## 2017-01-22 MED ORDER — HYDRALAZINE HCL 20 MG/ML IJ SOLN: 5.0000 mg | INTRAMUSCULAR | Status: DC | PRN

## 2017-01-22 MED ORDER — 0.9 % SODIUM CHLORIDE (POUR BTL) OPTIME
TOPICAL | Status: DC | PRN
  Administered 2017-01-22: 1000 mL

## 2017-01-22 MED ORDER — PROPOFOL 1000 MG/100ML IV EMUL
INTRAVENOUS | Status: AC
  Filled 2017-01-22: qty 100

## 2017-01-22 MED ORDER — FENTANYL CITRATE (PF) 100 MCG/2ML IJ SOLN: 25.0000 ug | INTRAMUSCULAR | Status: DC | PRN

## 2017-01-22 MED ORDER — ACETAMINOPHEN 325 MG PO TABS
650.0000 mg | ORAL_TABLET | Freq: Four times a day (QID) | ORAL | Status: DC | PRN
  Administered 2017-01-22 – 2017-01-24 (×3): 650 mg via ORAL
  Filled 2017-01-22 (×3): qty 2

## 2017-01-22 MED ORDER — CHLORHEXIDINE GLUCONATE 4 % EX LIQD
60.0000 mL | Freq: Once | CUTANEOUS | Status: AC
  Administered 2017-01-22: 4 via TOPICAL

## 2017-01-22 MED ORDER — PHENOL 1.4 % MT LIQD: 1.0000 | OROMUCOSAL | Status: DC | PRN

## 2017-01-22 MED ORDER — SODIUM CHLORIDE 0.9 % IV SOLN
INTRAVENOUS | Status: DC
  Administered 2017-01-22: 19:00:00 via INTRAVENOUS

## 2017-01-22 MED ORDER — MENTHOL 3 MG MT LOZG: 1.0000 | LOZENGE | OROMUCOSAL | Status: DC | PRN

## 2017-01-22 MED ORDER — PHENYLEPHRINE HCL 10 MG/ML IJ SOLN
INTRAVENOUS | Status: DC | PRN
  Administered 2017-01-22: 10 ug/min via INTRAVENOUS

## 2017-01-22 MED ORDER — METOCLOPRAMIDE HCL 5 MG PO TABS: 5.0000 mg | ORAL_TABLET | Freq: Three times a day (TID) | ORAL | Status: DC | PRN

## 2017-01-22 MED ORDER — HYDROMORPHONE HCL 2 MG/ML IJ SOLN
0.5000 mg | INTRAMUSCULAR | Status: AC | PRN
  Administered 2017-01-22: 0.5 mg via INTRAVENOUS
  Filled 2017-01-22: qty 1

## 2017-01-22 MED ORDER — ENOXAPARIN SODIUM 40 MG/0.4ML ~~LOC~~ SOLN: 40.0000 mg | SUBCUTANEOUS | Status: DC

## 2017-01-22 MED ORDER — POLYETHYLENE GLYCOL 3350 17 G PO PACK: 17.0000 g | PACK | Freq: Every day | ORAL | Status: DC | PRN

## 2017-01-22 MED ORDER — POVIDONE-IODINE 10 % EX SWAB
2.0000 "application " | Freq: Once | CUTANEOUS | Status: AC
  Administered 2017-01-22: 2 via TOPICAL

## 2017-01-22 MED ORDER — BISACODYL 10 MG RE SUPP: 10.0000 mg | Freq: Every day | RECTAL | Status: DC | PRN

## 2017-01-22 MED ORDER — CEFAZOLIN SODIUM-DEXTROSE 2-4 GM/100ML-% IV SOLN
2.0000 g | INTRAVENOUS | Status: AC
  Administered 2017-01-22: 2 g via INTRAVENOUS
  Filled 2017-01-22: qty 100

## 2017-01-22 MED ORDER — METOCLOPRAMIDE HCL 5 MG/ML IJ SOLN: 5.0000 mg | Freq: Three times a day (TID) | INTRAMUSCULAR | Status: DC | PRN

## 2017-01-22 MED ORDER — HYDROMORPHONE HCL 1 MG/ML IJ SOLN: 0.5000 mg | INTRAMUSCULAR | Status: DC | PRN

## 2017-01-22 MED ORDER — ONDANSETRON HCL 4 MG/2ML IJ SOLN: 4.0000 mg | Freq: Four times a day (QID) | INTRAMUSCULAR | Status: AC | PRN

## 2017-01-22 MED ORDER — BUPIVACAINE IN DEXTROSE 0.75-8.25 % IT SOLN
INTRATHECAL | Status: DC | PRN
  Administered 2017-01-22: 10.5 mg via INTRATHECAL

## 2017-01-22 MED ORDER — TRAMADOL HCL 50 MG PO TABS
50.0000 mg | ORAL_TABLET | Freq: Four times a day (QID) | ORAL | Status: DC
  Administered 2017-01-22 – 2017-01-23 (×3): 50 mg via ORAL
  Filled 2017-01-22 (×3): qty 1

## 2017-01-22 MED ORDER — ACETAMINOPHEN 650 MG RE SUPP: 650.0000 mg | Freq: Four times a day (QID) | RECTAL | Status: DC | PRN

## 2017-01-22 MED ORDER — PROPOFOL 10 MG/ML IV BOLUS
INTRAVENOUS | Status: AC
  Filled 2017-01-22: qty 20

## 2017-01-22 MED ORDER — HYDROMORPHONE HCL 1 MG/ML IJ SOLN
0.5000 mg | INTRAMUSCULAR | Status: DC | PRN
  Administered 2017-01-22: 0.5 mg via INTRAVENOUS
  Filled 2017-01-22: qty 0.5

## 2017-01-22 MED ORDER — ONDANSETRON HCL 4 MG PO TABS: 4.0000 mg | ORAL_TABLET | Freq: Four times a day (QID) | ORAL | Status: DC | PRN

## 2017-01-22 MED ORDER — ASPIRIN EC 325 MG PO TBEC
325.0000 mg | DELAYED_RELEASE_TABLET | Freq: Every day | ORAL | Status: DC
  Filled 2017-01-22: qty 1

## 2017-01-22 MED ORDER — ONDANSETRON HCL 4 MG/2ML IJ SOLN: 4.0000 mg | Freq: Four times a day (QID) | INTRAMUSCULAR | Status: DC | PRN

## 2017-01-22 SURGICAL SUPPLY — 45 items
BIT DRILL CANNULATED 5.0 (BIT) ×1 IMPLANT
BLADE SURG 10 STRL SS (BLADE) ×2 IMPLANT
BNDG COHESIVE 4X5 TAN STRL (GAUZE/BANDAGES/DRESSINGS) ×2 IMPLANT
BNDG GAUZE ELAST 4 BULKY (GAUZE/BANDAGES/DRESSINGS) ×2 IMPLANT
CHLORAPREP W/TINT 26ML (MISCELLANEOUS) ×2 IMPLANT
COVER MAYO STAND STRL (DRAPES) ×2 IMPLANT
COVER PERINEAL POST (MISCELLANEOUS) ×2 IMPLANT
COVER SURGICAL LIGHT HANDLE (MISCELLANEOUS) ×2 IMPLANT
DRAPE ORTHO SPLIT 77X108 STRL (DRAPES) ×4
DRAPE STERI IOBAN 125X83 (DRAPES) ×2 IMPLANT
DRAPE SURG 17X23 STRL (DRAPES) ×8 IMPLANT
DRAPE SURG ORHT 6 SPLT 77X108 (DRAPES) ×2 IMPLANT
DRSG MEPILEX BORDER 4X4 (GAUZE/BANDAGES/DRESSINGS) ×2 IMPLANT
DRSG PAD ABDOMINAL 8X10 ST (GAUZE/BANDAGES/DRESSINGS) ×6 IMPLANT
ELECT REM PT RETURN 9FT ADLT (ELECTROSURGICAL) ×2
ELECTRODE REM PT RTRN 9FT ADLT (ELECTROSURGICAL) ×1 IMPLANT
GAUZE XEROFORM 1X8 LF (GAUZE/BANDAGES/DRESSINGS) ×1 IMPLANT
GLOVE BIO SURGEON STRL SZ7 (GLOVE) ×2 IMPLANT
GLOVE BIO SURGEON STRL SZ7.5 (GLOVE) ×2 IMPLANT
GLOVE BIOGEL PI IND STRL 8 (GLOVE) ×1 IMPLANT
GLOVE BIOGEL PI INDICATOR 8 (GLOVE) ×1
GOWN STRL REUS W/ TWL LRG LVL3 (GOWN DISPOSABLE) ×2 IMPLANT
GOWN STRL REUS W/ TWL XL LVL3 (GOWN DISPOSABLE) ×2 IMPLANT
GOWN STRL REUS W/TWL LRG LVL3 (GOWN DISPOSABLE) ×4
GOWN STRL REUS W/TWL XL LVL3 (GOWN DISPOSABLE) ×4
GUIDEWIRE BALL NOSE 80CM (WIRE) ×2 IMPLANT
GUIDEWIRE THREAD TIP 3.2X9 (WIRE) ×3 IMPLANT
KIT ROOM TURNOVER OR (KITS) ×2 IMPLANT
LINER BOOT UNIVERSAL DISP (MISCELLANEOUS) ×2 IMPLANT
MANIFOLD NEPTUNE II (INSTRUMENTS) ×2 IMPLANT
NS IRRIG 1000ML POUR BTL (IV SOLUTION) ×2 IMPLANT
PACK GENERAL/GYN (CUSTOM PROCEDURE TRAY) ×2 IMPLANT
PAD ARMBOARD 7.5X6 YLW CONV (MISCELLANEOUS) ×4 IMPLANT
PAD CAST 4YDX4 CTTN HI CHSV (CAST SUPPLIES) ×1 IMPLANT
PADDING CAST COTTON 4X4 STRL (CAST SUPPLIES) ×2
SCREW CANCELLOUS 7.0X75MM (Screw) ×2 IMPLANT
SCREW CANNULATED 7.0X70 (Screw) ×1 IMPLANT
STAPLER VISISTAT 35W (STAPLE) ×3 IMPLANT
SUT VIC AB 1 CT1 27 (SUTURE) ×4
SUT VIC AB 1 CT1 27XBRD ANBCTR (SUTURE) ×1 IMPLANT
SUT VIC AB 2-0 CT1 27 (SUTURE) ×4
SUT VIC AB 2-0 CT1 TAPERPNT 27 (SUTURE) ×1 IMPLANT
TOWEL OR 17X24 6PK STRL BLUE (TOWEL DISPOSABLE) ×2 IMPLANT
TOWEL OR 17X26 10 PK STRL BLUE (TOWEL DISPOSABLE) ×2 IMPLANT
WATER STERILE IRR 1000ML POUR (IV SOLUTION) ×2 IMPLANT

## 2017-01-22 NOTE — Transfer of Care (Signed)
Immediate Anesthesia Transfer of Care Note  Patient: Jamie Carlson  Procedure(s) Performed: Procedure(s): CANNULATED HIP PINNING (Left)  Patient Location: PACU  Anesthesia Type:Spinal  Level of Consciousness: awake, alert  and oriented  Airway & Oxygen Therapy: Patient Spontanous Breathing  Post-op Assessment: Report given to RN and Post -op Vital signs reviewed and stable  Post vital signs: Reviewed and stable  Last Vitals:  Vitals:   01/22/17 0211 01/22/17 0700  BP: (!) 181/88 (!) 166/90  Pulse: 88 81  Resp:    Temp: 36.8 C 36.8 C    Last Pain:  Vitals:   01/22/17 0941  TempSrc:   PainSc: 0-No pain         Complications: No apparent anesthesia complications

## 2017-01-22 NOTE — Consult Note (Signed)
Reason for Consult:Eval L hip fracture Referring Physician: [name] is an 83 y.o. female.  HPI: s/p fall yesterday with L hip pain. Denied other injury with fall.  Previously independent, community ambulator without assist.   Past Medical History:  Diagnosis Date  . Hypertension   . Urinary tract infection     Past Surgical History:  Procedure Laterality Date  . CESAREAN SECTION      Family History  Problem Relation Age of Onset  . Family history unknown: Yes    Social History:  reports that she has never smoked. She has never used smokeless tobacco. She reports that she does not drink alcohol or use drugs.  Allergies:  Allergies  Allergen Reactions  . Sulfa Antibiotics     unknown  . Bee Venom Rash    Medications: I have reviewed the patient's current medications.  Results for orders placed or performed during the hospital encounter of 01/21/17 (from the past 48 hour(s))  Urinalysis, Routine w reflex microscopic     Status: Abnormal   Collection Time: 01/21/17 10:36 PM  Result Value Ref Range   Color, Urine STRAW (A) YELLOW   APPearance CLEAR CLEAR   Specific Gravity, Urine 1.004 (L) 1.005 - 1.030   pH 6.0 5.0 - 8.0   Glucose, UA NEGATIVE NEGATIVE mg/dL   Hgb urine dipstick MODERATE (A) NEGATIVE   Bilirubin Urine NEGATIVE NEGATIVE   Ketones, ur NEGATIVE NEGATIVE mg/dL   Protein, ur NEGATIVE NEGATIVE mg/dL   Nitrite NEGATIVE NEGATIVE   Leukocytes, UA SMALL (A) NEGATIVE   RBC / HPF 0-5 0 - 5 RBC/hpf   WBC, UA 6-30 0 - 5 WBC/hpf   Bacteria, UA NONE SEEN NONE SEEN   Squamous Epithelial / LPF NONE SEEN NONE SEEN  ABO/Rh     Status: None   Collection Time: 01/21/17 10:55 PM  Result Value Ref Range   ABO/RH(D) O NEG   CBC with Differential     Status: Abnormal   Collection Time: 01/21/17 11:03 PM  Result Value Ref Range   WBC 12.5 (H) 4.0 - 10.5 K/uL   RBC 4.55 3.87 - 5.11 MIL/uL   Hemoglobin 13.9 12.0 - 15.0 g/dL   HCT 40.8 36.0 - 46.0 %   MCV  89.7 78.0 - 100.0 fL   MCH 30.5 26.0 - 34.0 pg   MCHC 34.1 30.0 - 36.0 g/dL   RDW 13.3 11.5 - 15.5 %   Platelets 259 150 - 400 K/uL   Neutrophils Relative % 85 %   Neutro Abs 10.7 (H) 1.7 - 7.7 K/uL   Lymphocytes Relative 9 %   Lymphs Abs 1.1 0.7 - 4.0 K/uL   Monocytes Relative 5 %   Monocytes Absolute 0.6 0.1 - 1.0 K/uL   Eosinophils Relative 1 %   Eosinophils Absolute 0.1 0.0 - 0.7 K/uL   Basophils Relative 0 %   Basophils Absolute 0.0 0.0 - 0.1 K/uL  Comprehensive metabolic panel     Status: Abnormal   Collection Time: 01/21/17 11:03 PM  Result Value Ref Range   Sodium 139 135 - 145 mmol/L   Potassium 3.3 (L) 3.5 - 5.1 mmol/L   Chloride 104 101 - 111 mmol/L   CO2 25 22 - 32 mmol/L   Glucose, Bld 112 (H) 65 - 99 mg/dL   BUN 14 6 - 20 mg/dL   Creatinine, Ser 0.70 0.44 - 1.00 mg/dL   Calcium 9.5 8.9 - 10.3 mg/dL   Total Protein 7.4  6.5 - 8.1 g/dL   Albumin 4.3 3.5 - 5.0 g/dL   AST 24 15 - 41 U/L   ALT 21 14 - 54 U/L   Alkaline Phosphatase 83 38 - 126 U/L   Total Bilirubin 0.8 0.3 - 1.2 mg/dL   GFR calc non Af Amer >60 >60 mL/min   GFR calc Af Amer >60 >60 mL/min    Comment: (NOTE) The eGFR has been calculated using the CKD EPI equation. This calculation has not been validated in all clinical situations. eGFR's persistently <60 mL/min signify possible Chronic Kidney Disease.    Anion gap 10 5 - 15  Type and screen [place]     Status: None   Collection Time: 01/21/17 11:03 PM  Result Value Ref Range   ABO/RH(D) O NEG    Antibody Screen NEG    Sample Expiration 01/24/2017   Protime-INR     Status: None   Collection Time: 01/21/17 11:03 PM  Result Value Ref Range   Prothrombin Time 12.6 11.4 - 15.2 seconds   INR 0.95   Type and screen [place]     Status: None   Collection Time: 01/22/17  4:55 AM  Result Value Ref Range   ABO/RH(D) O NEG    Antibody Screen NEG    Sample Expiration 01/25/2017   ABO/Rh     Status: None  (Preliminary result)   Collection Time: 01/22/17  4:55 AM  Result Value Ref Range   ABO/RH(D) O NEG   Surgical pcr screen     Status: Abnormal   Collection Time: 01/22/17  5:24 AM  Result Value Ref Range   MRSA, PCR POSITIVE (A) NEGATIVE   Staphylococcus aureus POSITIVE (A) NEGATIVE    Comment:        The Xpert SA Assay (FDA approved for NASAL specimens in patients over 42 years of age), is one component of a comprehensive surveillance program.  Test performance has been validated by [facility] for patients greater than or equal to 52 year old. It is not intended to diagnose infection nor to guide or monitor treatment. RESULT CALLED TO, READ BACK BY AND VERIFIED WITH: F [facility] AT 0715 01/22/17 BY L BENFIELD     Dg Chest 2 View  Result Date: 01/21/2017 CLINICAL DATA:  LEFT femoral neck fracture EXAM: CHEST  2 VIEW COMPARISON:  None. FINDINGS: Normal cardiac silhouette. Lungs are hyperinflated. No effusion, infiltrate pneumothorax. IMPRESSION: Hyperinflated lungs without acute findings. Electronically Signed   By: Suzy Bouchard M.D.   On: 01/21/2017 21:56   Dg Knee 2 Views Left  Result Date: 01/21/2017 CLINICAL DATA:  Fall-left leg injury. Pt is presented from home by medics who report that that pt had an unwitnessed fall that she, pt who is AOx4 but hard of hearing, reports she "tripped and fell." She exhibiting some degree of discomfort EXAM: LEFT KNEE - 1-2 VIEW COMPARISON:  None. FINDINGS: No fracture of the proximal tibia or distal femur. Patella is normal. No joint effusion. IMPRESSION: No fracture or dislocation. Electronically Signed   By: Suzy Bouchard M.D.   On: 01/21/2017 21:53   Dg Hip Unilat W Or Wo Pelvis 2-3 Views Left  Result Date: 01/21/2017 CLINICAL DATA:  Fall, discomfort. EXAM: DG HIP (WITH OR WITHOUT PELVIS) 2-3V LEFT COMPARISON:  None. FINDINGS: There is a fracture of the LEFT femoral neck with shortening. Fracture is in a subcapital location. Hip  joint remains intact. IMPRESSION: Subcapital fracture of the LEFT  femoral neck with shortening. Electronically Signed   By: Suzy Bouchard M.D.   On: 01/21/2017 21:55   Dg Femur Min 2 Views Left  Result Date: 01/21/2017 CLINICAL DATA:  Fall-left leg injury. Pt is presented from home by medics who report that that pt had an unwitnessed fall that she, pt who is AOx4 but hard of hearing, reports she "tripped and fell." She exhibiting some degree of discomfort on h.*comment was truncated* EXAM: LEFT FEMUR 2 VIEWS COMPARISON:  None. FINDINGS: Limited view of the distal LEFT femur demonstrates no fracture. Proximal femur not imaged. IMPRESSION: No fracture distal femur. Electronically Signed   By: Suzy Bouchard M.D.   On: 01/21/2017 21:53    Review of Systems  All other systems reviewed and are negative.  Blood pressure (!) 166/90, pulse 81, temperature 98.2 F (36.8 C), temperature source Oral, resp. rate 18, SpO2 94 %. Physical Exam  Constitutional: She is oriented to person, place, and time.  HENT:  Head: Atraumatic.  Eyes: EOM are normal.  Cardiovascular: Intact distal pulses.   Respiratory: Effort normal.  Musculoskeletal:  LLE flexed, TTP proximal hip. NVID. No other msk TTP or deformity  Neurological: She is alert and oriented to person, place, and time.  Skin: Skin is warm and dry.  Psychiatric: She has a normal mood and affect.    Assessment/Plan: L hip impacted femoral neck fracture Discussed with son at length, recommend screw fixation for compression and to prevent displacement.  Son in agreement Plan for surgery when cleared by cardiology p echo. Bedrest for now. NPO  Jamie Carlson WILLIAM 01/22/2017, 9:04 AM

## 2017-01-22 NOTE — Progress Notes (Addendum)
PROGRESS NOTE  Jamie Carlson  ZOX:[id number] DOB: 1918-07-16 DOA: 01/21/2017 PCP: No primary care provider on file. Outpatient Specialists:  Subjective: Patient seen with nursing staff at bedside, patient was irritable but.  Spoke with the son over the phone, requested to change CODE STATUS to DNR/DNI. Await echocardiogram before she can go for surgery, overall less she had very severe findings like severe aortic stenosis/low output heart failure she will probably be able to go  Brief Narrative:  Jamie Carlson is a 83 y.o. female with little known past medical history who does not take any medications and presents to the emergency department with severe left hip pain following an unwitnessed fall. Patient reports that she was in her usual state of health and having an uneventful day before tripping over something and falling onto her left side. She reports severe pain at the left thigh immediately after the fall, worse with any attempted movement of the leg. She denies striking her head and is not on any anticoagulant. She denies frequent falls. She denies recent fevers or chills, denies chest pain or palpitations, and denies cough or dyspnea. There is been no recent abdominal pain, nausea, vomiting, or diarrhea. She also denies melena or hematochezia. Surgical history includes a remote cesarean section which she reportedly tolerated without incident.  Assessment & Plan:   Principal Problem:   Closed subcapital fracture of femur, left, initial encounter [facility]) Active Problems:   Essential hypertension   Hypertensive urgency   Hypokalemia   Cardiac murmur  This is a no charge note, patient seen earlier today by my colleague Dr. applied. Patient admitted for left subcapital femoral fracture, need surgery later today. Advanced age, low weight, HTN and cardiac murmur on her risk factors for surgery.  Closed left subcapital femur fracture  - Presents with severe left thigh pain after a  mechanical fall, found to have subcapital fracture. - Orthopedic surgery consulting for possible operative repair  - Based the available data, patient presents a relatively high-risk of 1.1% for perioperative risk for cardiac event per Nolon Nations. al.  - She has a loud systolic murmur at upper sternal border concerning for severe valvular disease and risk for perioperative heart failure; discussed with cardiology and she will need evaluation with echocardiogram prior to surgery -Await Echo prior to operative management  Cardiac murmur - As above, pt has loud murmur not previously evaluated and will undergo TTE prior to surgery  - No overt heart failure on admission   Hypertensive urgency  - BP in 190/110 range on admission  - Pain is likely contributing; pt not on antihypertensives at home - This is improving with IV hydralazine. Blood pressure is 160/90 this morning  Hypokalemia  - Serum potassium is 3.3 on admission and was replaced with 40 mEq oral potassium - Check mag level with repeat chem panel in am    ?UTI  - Urine sent for culture and pt treated with a dose of Rocephin in ED  - UA not convincing for infxn, no bacteria seen, and pt denies dysuria  - Follow culture    DVT prophylaxis:  Code Status: Full Code Family Communication:  Disposition Plan:  Diet: Diet NPO time specified  Consultants:   None  Procedures:   None  Antimicrobials:   None   Objective: Vitals:   01/21/17 2107 01/22/17 0048 01/22/17 0211 01/22/17 0700  BP: (!) 194/111 (!) 189/93 (!) 181/88 (!) 166/90  Pulse: 73 82 88 81  Resp: 18 18  Temp: 97.9 F (36.6 C)  98.3 F (36.8 C) 98.2 F (36.8 C)  TempSrc: Oral  Oral Oral  SpO2: 93% 99% 100% 94%    Intake/Output Summary (Last 24 hours) at 01/22/17 1018 Last data filed at 01/22/17 0049  Gross per 24 hour  Intake              350 ml  Output                0 ml  Net              350 ml   There were no vitals filed for this  visit.  Examination: General exam: Appears calm and comfortable  Respiratory system: Clear to auscultation. Respiratory effort normal. Cardiovascular system: S1 & S2 heard, RRR. No JVD, murmurs, rubs, gallops or clicks. No pedal edema. Gastrointestinal system: Abdomen is nondistended, soft and nontender. No organomegaly or masses felt. Normal bowel sounds heard. Central nervous system: Alert and oriented. No focal neurological deficits. Extremities: Symmetric 5 x 5 power. Skin: No rashes, lesions or ulcers Psychiatry: Judgement and insight appear normal. Mood & affect appropriate.   Data Reviewed: I have personally reviewed following labs and imaging studies  CBC:  Recent Labs Lab 01/21/17 2303  WBC 12.5*  NEUTROABS 10.7*  HGB 13.9  HCT 40.8  MCV 89.7  PLT 259   Basic Metabolic Panel:  Recent Labs Lab 01/21/17 2303  NA 139  K 3.3*  CL 104  CO2 25  GLUCOSE 112*  BUN 14  CREATININE 0.70  CALCIUM 9.5   GFR: CrCl cannot be calculated (Unknown ideal weight.). Liver Function Tests:  Recent Labs Lab 01/21/17 2303  AST 24  ALT 21  ALKPHOS 83  BILITOT 0.8  PROT 7.4  ALBUMIN 4.3   No results for input(s): LIPASE, AMYLASE in the last 168 hours. No results for input(s): AMMONIA in the last 168 hours. Coagulation Profile:  Recent Labs Lab 01/21/17 2303  INR 0.95   Cardiac Enzymes: No results for input(s): CKTOTAL, CKMB, CKMBINDEX, TROPONINI in the last 168 hours. BNP (last 3 results) No results for input(s): PROBNP in the last 8760 hours. HbA1C: No results for input(s): HGBA1C in the last 72 hours. CBG: No results for input(s): GLUCAP in the last 168 hours. Lipid Profile: No results for input(s): CHOL, HDL, LDLCALC, TRIG, CHOLHDL, LDLDIRECT in the last 72 hours. Thyroid Function Tests: No results for input(s): TSH, T4TOTAL, FREET4, T3FREE, THYROIDAB in the last 72 hours. Anemia Panel: No results for input(s): VITAMINB12, FOLATE, FERRITIN, TIBC, IRON,  RETICCTPCT in the last 72 hours. Urine analysis:    Component Value Date/Time   COLORURINE STRAW (A) 01/21/2017 2236   APPEARANCEUR CLEAR 01/21/2017 2236   LABSPEC 1.004 (L) 01/21/2017 2236   PHURINE 6.0 01/21/2017 2236   GLUCOSEU NEGATIVE 01/21/2017 2236   HGBUR MODERATE (A) 01/21/2017 2236   BILIRUBINUR NEGATIVE 01/21/2017 2236   KETONESUR NEGATIVE 01/21/2017 2236   PROTEINUR NEGATIVE 01/21/2017 2236   UROBILINOGEN 1.0 06/22/2013 1458   NITRITE NEGATIVE 01/21/2017 2236   LEUKOCYTESUR SMALL (A) 01/21/2017 2236   Sepsis Labs: @LABRCNTIP (procalcitonin:4,lacticidven:4)  ) Recent Results (from the past 240 hour(s))  Surgical pcr screen     Status: Abnormal   Collection Time: 01/22/17  5:24 AM  Result Value Ref Range Status   MRSA, PCR POSITIVE (A) NEGATIVE Final   Staphylococcus aureus POSITIVE (A) NEGATIVE Final    Comment:        The Xpert SA Assay (FDA approved  for NASAL specimens in patients over 21 years of age), is one component of a comprehensive surveillance program.  Test performance has been validat104ed by [facility] for patients greater than or equal to 81 year old. It is not intended to diagnose infection nor to guide or monitor treatment. RESULT CALLED TO, READ BACK BY AND VERIFIED WITH: F [facility]MAL[facility],RN AT 0715 01/22/17 BY L BENFIELD      Invalid input(s): PROCALCITONIN, LACTICACIDVEN   Radiology Studies: Dg Chest 2 View  Result Date: 01/21/2017 CLINICAL DATA:  LEFT femoral neck fracture EXAM: CHEST  2 VIEW COMPARISON:  None. FINDINGS: Normal cardiac silhouette. Lungs are hyperinflated. No effusion, infiltrate pneumothorax. IMPRESSION: Hyperinflated lungs without acute findings. Electronically Signed   By: Genevive BiStewart  Edmunds M.D.   On: 01/21/2017 21:56   Dg Knee 2 Views Left  Result Date: 01/21/2017 CLINICAL DATA:  Fall-left leg injury. Pt is presented from home by medics who report that that pt had an unwitnessed fall that she, pt who is AOx4 but hard of  hearing, reports she "tripped and fell." She exhibiting some degree of discomfort EXAM: LEFT KNEE - 1-2 VIEW COMPARISON:  None. FINDINGS: No fracture of the proximal tibia or distal femur. Patella is normal. No joint effusion. IMPRESSION: No fracture or dislocation. Electronically Signed   By: Genevive BiStewart  Edmunds M.D.   On: 01/21/2017 21:53   Dg Hip Unilat W Or Wo Pelvis 2-3 Views Left  Result Date: 01/21/2017 CLINICAL DATA:  Fall, discomfort. EXAM: DG HIP (WITH OR WITHOUT PELVIS) 2-3V LEFT COMPARISON:  None. FINDINGS: There is a fracture of the LEFT femoral neck with shortening. Fracture is in a subcapital location. Hip joint remains intact. IMPRESSION: Subcapital fracture of the LEFT femoral neck with shortening. Electronically Signed   By: Genevive BiStewart  Edmunds M.D.   On: 01/21/2017 21:55   Dg Femur Min 2 Views Left  Result Date: 01/21/2017 CLINICAL DATA:  Fall-left leg injury. Pt is presented from home by medics who report that that pt had an unwitnessed fall that she, pt who is AOx4 but hard of hearing, reports she "tripped and fell." She exhibiting some degree of discomfort on h.*comment was truncated* EXAM: LEFT FEMUR 2 VIEWS COMPARISON:  None. FINDINGS: Limited view of the distal LEFT femur demonstrates no fracture. Proximal femur not imaged. IMPRESSION: No fracture distal femur. Electronically Signed   By: Genevive BiStewart  Edmunds M.D.   On: 01/21/2017 21:53        Scheduled Meds: .  ceFAZolin (ANCEF) IV  2 g Intravenous On Call to OR  . chlorhexidine  60 mL Topical Once  . enoxaparin (LOVENOX) injection  40 mg Subcutaneous Q24H  . povidone-iodine  2 application Topical Once   Continuous Infusions: . sodium chloride 80 mL/hr at 01/22/17 0229     LOS: 0 days    Time spent: 35 minutes    Keana Dueitt A, MD [facility] Pager [phone]  If 7PM-7AM, please contact night-coverage [email or web address] Password [email or web address] 01/22/2017, 10:18 AM

## 2017-01-22 NOTE — Op Note (Addendum)
01/21/2017 - 01/22/2017  4:05 PM  PATIENT:  Jamie Carlson    PRE-OPERATIVE DIAGNOSIS:  LEFT FEMORAL NECK FRACTURE  POST-OPERATIVE DIAGNOSIS:  Same  PROCEDURE:  CANNULATED HIP PINNING  SURGEON:  Mable ParisHANDLER,Chey Cho WILLIAM, MD  PHYSICIAN ASSISTANT: none  ANESTHESIA:   General  PREOPERATIVE INDICATIONS:  Jamie Carlson is a  83 y.o. female who fell and was found to have a diagnosis of LEFT FEMORAL NECK FRACTURE who elected for surgical management.    The risks benefits and alternatives were discussed with the patient preoperatively including but not limited to the risks of infection, bleeding, nerve injury, cardiopulmonary complications, blood clots, malunion, nonunion, avascular necrosis, the need for revision surgery, the potential for conversion to hemiarthroplasty, among others, and the patient was willing to proceed.  OPERATIVE IMPLANTS: 7.0 mm cannulated screws x3  OPERATIVE FINDINGS: Clinical osteoporosis with weak bone, proximal femur  OPERATIVE PROCEDURE: The patient was brought to the operating room and placed in supine position. IV antibiotics were given. General anesthesia administered. Foley was also given. She was placed on the fracture table. The left lower extremity was positioned, without any significant reduction maneuver. The left lower extremity was prepped and draped in usual sterile fashion.  Time out was performed.  Small incision was made distal to the greater trochanter, and 3 guidewires were introduced Into an inverted triangle configuration. The lengths were measured. The reduction was slightly valgus, and near-anatomic. I opened the cortex with a cannulated drill, and then placed the screws into position. Satisfactory fixation was achieved.  The wounds were irrigated copiously, and repaired with Vicryl with staples and sterile gauze. There no complications and the patient tolerated the procedure well.  The patient will be weightbearing as tolerated, and will be on  ASA and SCDs for DVT prophylaxis..Marland Kitchen

## 2017-01-22 NOTE — Anesthesia Postprocedure Evaluation (Signed)
Anesthesia Post Note  Patient: Jamie Carlson  Procedure(s) Performed: Procedure(s) (LRB): CANNULATED HIP PINNING (Left)  Patient location during evaluation: PACU Anesthesia Type: Spinal Level of consciousness: awake and alert Pain management: pain level controlled Vital Signs Assessment: post-procedure vital signs reviewed and stable Respiratory status: spontaneous breathing and respiratory function stable Cardiovascular status: blood pressure returned to baseline and stable Postop Assessment: spinal receding Anesthetic complications: no       Last Vitals:  Vitals:   01/22/17 1718 01/22/17 1744  BP: (!) 161/89 (!) 160/71  Pulse: 86 97  Resp: 18 20  Temp: 36.4 C 36.6 C    Last Pain:  Vitals:   01/22/17 0941  TempSrc:   PainSc: 0-No pain                 Ryliegh Mcduffey,W. EDMOND

## 2017-01-22 NOTE — Progress Notes (Signed)
SLP Cancellation Note  Patient Details Name: Jamie Carlson MRN: [id number] DOB: January 15, 1918   Cancelled treatment:       Reason Eval/Treat Not Completed: Other (comment) (NPO for possible surgery today, Will f/u 3/19. )  Ferdinand LangoLeah Delynn Pursley MA, CCC-SLP [phone]  [name] Meryl 01/22/2017, 11:33 AM

## 2017-01-22 NOTE — Anesthesia Preprocedure Evaluation (Addendum)
Anesthesia Evaluation  Patient identified by MRN, date of birth, ID band Patient awake    Reviewed: Allergy & Precautions, H&P , NPO status , Patient's Chart, lab work & pertinent test results  Airway Mallampati: II  TM Distance: >3 FB Neck ROM: Full    Dental no notable dental hx. (+) Edentulous Upper, Edentulous Lower, Dental Advisory Given   Pulmonary neg pulmonary ROS,    Pulmonary exam normal breath sounds clear to auscultation       Cardiovascular hypertension, Pt. on medications + Valvular Problems/Murmurs AS  Rhythm:Regular Rate:Normal + Systolic murmurs    Neuro/Psych negative neurological ROS  negative psych ROS   GI/Hepatic negative GI ROS, Neg liver ROS,   Endo/Other  negative endocrine ROS  Renal/GU negative Renal ROS  negative genitourinary   Musculoskeletal   Abdominal   Peds  Hematology negative hematology ROS (+)   Anesthesia Other Findings   Reproductive/Obstetrics negative OB ROS                            Anesthesia Physical Anesthesia Plan  ASA: II  Anesthesia Plan: Spinal   Post-op Pain Management:    Induction: Intravenous  Airway Management Planned: Simple Face Mask  Additional Equipment:   Intra-op Plan:   Post-operative Plan:   Informed Consent: I have reviewed the patients History and Physical, chart, labs and discussed the procedure including the risks, benefits and alternatives for the proposed anesthesia with the patient or authorized representative who has indicated his/her understanding and acceptance.   Dental advisory given  Plan Discussed with: CRNA  Anesthesia Plan Comments:         Anesthesia Quick Evaluation

## 2017-01-22 NOTE — Progress Notes (Signed)
  Echocardiogram 2D Echocardiogram has been performed.  Jamie Carlson, Jamie Carlson 01/22/2017, 10:43 AM

## 2017-01-22 NOTE — H&P (Signed)
History and Physical    Jamie Carlson ZOX:[id number] DOB: 05-23-1918 DOA: 01/21/2017  PCP: No primary care provider on file.   Patient coming from: Home  Chief Complaint: Fall with left hip pain   HPI: Jamie Carlson is a 83 y.o. female with little known past medical history who does not take any medications and presents to the emergency department with severe left hip pain following an unwitnessed fall. Patient reports that she was in her usual state of health and having an uneventful day before tripping over something and falling onto her left side. She reports severe pain at the left thigh immediately after the fall, worse with any attempted movement of the leg. She denies striking her head and is not on any anticoagulant. She denies frequent falls. She denies recent fevers or chills, denies chest pain or palpitations, and denies cough or dyspnea. There is been no recent abdominal pain, nausea, vomiting, or diarrhea. She also denies melena or hematochezia. Surgical history includes a remote cesarean section which she reportedly tolerated without incident.  ED Course: Upon arrival to the ED, patient is found to be afebrile, saturating well on room air, and hypertensive to 194/111. EKG features a sinus rhythm with nonspecific ST-T abnormalities in the inferolateral leads. Chest x-ray is negative for acute cardiopulmonary disease, but notable for hyperinflation. Plain radiographs reveal a subcapital left femoral neck fracture with shortening, but are negative for fractures involving the distal femur or knee. Chemistry panel is notable for a mild hypokalemia to 3.3 and CBC features a mild leukocytosis to 12,500. INR 0.95. Urinalysis is negative for bacteria, notable for small leukocytes with negative nitrites, and 6-30 WBC. Urine was sent for culture, patient was treated with empiric Rocephin for possible UTI, she was given 25 g of fentanyl, and 40 mEq of oral potassium. Orthopedic surgery was consulted  by the ED physician and advised a medical admission to [facility] for possible surgical intervention in the morning. Patient remained hypertensive in the ED with normal heart rate and no apparent respiratory distress. She will be admitted to the medical-surgical unit [facility] for ongoing hydration and management of left subcapital femur fracture.  Review of Systems:  All other systems reviewed and apart from HPI, are negative.  Past Medical History:  Diagnosis Date  . Hypertension   . Urinary tract infection     Past Surgical History:  Procedure Laterality Date  . CESAREAN SECTION       reports that she has never smoked. She has never used smokeless tobacco. She reports that she does not drink alcohol or use drugs.  Allergies  Allergen Reactions  . Sulfa Antibiotics     unknown  . Bee Venom Rash    Family History  Problem Relation Age of Onset  . Family history unknown: Yes     Prior to Admission medications   Not on File    Physical Exam: Vitals:   01/21/17 2107  BP: (!) 194/111  Pulse: 73  Resp: 18  Temp: 97.9 F (36.6 C)  TempSrc: Oral  SpO2: 93%      Constitutional: NAD, calm. Appears uncomfortable, very thin.  Eyes: PERTLA, lids and conjunctivae normal ENMT: Mucous membranes are moist. Posterior pharynx clear of any exudate or lesions.   Neck: normal, supple, no masses, no thyromegaly Respiratory: clear to auscultation bilaterally, no wheezing, no crackles. Normal respiratory effort.   Cardiovascular: S1 & S2 heard, regular rate and rhythm, loud holosystolic murmur at upper sternal  border, soft systolic murmur at apex. No extremity edema. No significant JVD. Abdomen: No distension, no tenderness, no masses palpated. Bowel sounds normal.  Musculoskeletal: no clubbing / cyanosis. LLE ROM severely limited by pain and hip region exquisitely tender. Neurovascularly intact in distal LLE.     Skin: no significant rashes, lesions, ulcers.  Warm, dry, well-perfused. Poor turgor.  Neurologic: CN 2-12 grossly intact. Sensation intact, DTR normal. Strength 5/5 in all 4 limbs. Gross hearing impairment.  Psychiatric: Normal judgment and insight. Alert and oriented x 3. Normal mood and affect.     Labs on Admission: I have personally reviewed following labs and imaging studies  CBC:  Recent Labs Lab 01/21/17 2303  WBC 12.5*  NEUTROABS 10.7*  HGB 13.9  HCT 40.8  MCV 89.7  PLT 259   Basic Metabolic Panel:  Recent Labs Lab 01/21/17 2303  NA 139  K 3.3*  CL 104  CO2 25  GLUCOSE 112*  BUN 14  CREATININE 0.70  CALCIUM 9.5   GFR: CrCl cannot be calculated (Unknown ideal weight.). Liver Function Tests:  Recent Labs Lab 01/21/17 2303  AST 24  ALT 21  ALKPHOS 83  BILITOT 0.8  PROT 7.4  ALBUMIN 4.3   No results for input(s): LIPASE, AMYLASE in the last 168 hours. No results for input(s): AMMONIA in the last 168 hours. Coagulation Profile:  Recent Labs Lab 01/21/17 2303  INR 0.95   Cardiac Enzymes: No results for input(s): CKTOTAL, CKMB, CKMBINDEX, TROPONINI in the last 168 hours. BNP (last 3 results) No results for input(s): PROBNP in the last 8760 hours. HbA1C: No results for input(s): HGBA1C in the last 72 hours. CBG: No results for input(s): GLUCAP in the last 168 hours. Lipid Profile: No results for input(s): CHOL, HDL, LDLCALC, TRIG, CHOLHDL, LDLDIRECT in the last 72 hours. Thyroid Function Tests: No results for input(s): TSH, T4TOTAL, FREET4, T3FREE, THYROIDAB in the last 72 hours. Anemia Panel: No results for input(s): VITAMINB12, FOLATE, FERRITIN, TIBC, IRON, RETICCTPCT in the last 72 hours. Urine analysis:    Component Value Date/Time   COLORURINE STRAW (A) 01/21/2017 2236   APPEARANCEUR CLEAR 01/21/2017 2236   LABSPEC 1.004 (L) 01/21/2017 2236   PHURINE 6.0 01/21/2017 2236   GLUCOSEU NEGATIVE 01/21/2017 2236   HGBUR MODERATE (A) 01/21/2017 2236   BILIRUBINUR NEGATIVE 01/21/2017  2236   KETONESUR NEGATIVE 01/21/2017 2236   PROTEINUR NEGATIVE 01/21/2017 2236   UROBILINOGEN 1.0 06/22/2013 1458   NITRITE NEGATIVE 01/21/2017 2236   LEUKOCYTESUR SMALL (A) 01/21/2017 2236   Sepsis Labs: @LABRCNTIP (procalcitonin:4,lacticidven:4) )No results found for this or any previous visit (from the past 240 hour(s)).   Radiological Exams on Admission: Dg Chest 2 View  Result Date: 01/21/2017 CLINICAL DATA:  LEFT femoral neck fracture EXAM: CHEST  2 VIEW COMPARISON:  None. FINDINGS: Normal cardiac silhouette. Lungs are hyperinflated. No effusion, infiltrate pneumothorax. IMPRESSION: Hyperinflated lungs without acute findings. Electronically Signed   By: Genevive BiStewart  Edmunds M.D.   On: 01/21/2017 21:56   Dg Knee 2 Views Left  Result Date: 01/21/2017 CLINICAL DATA:  Fall-left leg injury. Pt is presented from home by medics who report that that pt had an unwitnessed fall that she, pt who is AOx4 but hard of hearing, reports she "tripped and fell." She exhibiting some degree of discomfort EXAM: LEFT KNEE - 1-2 VIEW COMPARISON:  None. FINDINGS: No fracture of the proximal tibia or distal femur. Patella is normal. No joint effusion. IMPRESSION: No fracture or dislocation. Electronically Signed   By:  Genevive Bi M.D.   On: 01/21/2017 21:53   Dg Hip Unilat W Or Wo Pelvis 2-3 Views Left  Result Date: 01/21/2017 CLINICAL DATA:  Fall, discomfort. EXAM: DG HIP (WITH OR WITHOUT PELVIS) 2-3V LEFT COMPARISON:  None. FINDINGS: There is a fracture of the LEFT femoral neck with shortening. Fracture is in a subcapital location. Hip joint remains intact. IMPRESSION: Subcapital fracture of the LEFT femoral neck with shortening. Electronically Signed   By: Genevive Bi M.D.   On: 01/21/2017 21:55   Dg Femur Min 2 Views Left  Result Date: 01/21/2017 CLINICAL DATA:  Fall-left leg injury. Pt is presented from home by medics who report that that pt had an unwitnessed fall that she, pt who is AOx4 but hard  of hearing, reports she "tripped and fell." She exhibiting some degree of discomfort on h.*comment was truncated* EXAM: LEFT FEMUR 2 VIEWS COMPARISON:  None. FINDINGS: Limited view of the distal LEFT femur demonstrates no fracture. Proximal femur not imaged. IMPRESSION: No fracture distal femur. Electronically Signed   By: Genevive Bi M.D.   On: 01/21/2017 21:53    EKG: Independently reviewed. Sinus rhythm, non-specific ST-T abnormality inferolateral leads.   Assessment/Plan  1. Closed left subcapital femur fracture  - Pt presents with severe left thigh pain after a mechanical fall, found to have subcapital fracture with shortening on radiographs  - Orthopedic surgery consulting for possible operative repair  - Based the available data, patient presents a relatively high-risk of 1.1% for perioperative risk for cardiac event per Nolon Nations. al.  - She has a loud systolic murmur at upper sternal border concerning for severe valvular disease and risk for perioperative heart failure; discussed with cardiology and she will need evaluation with echocardiogram prior to surgery - Off-load the affected heel, place ice to hip, continue prn analgesia, control HTN as below with prn's   2. Cardiac murmur - As above, pt has loud murmur not previously evaluated and will undergo TTE prior to surgery  - No overt heart failure on admission   3. Hypertensive urgency  - BP in 190/110 range on admission  - Pain is likely contributing; pt not on antihypertensives at home - Monitor and treat prn with hydralazine IVP's    4. Hypokalemia  - Serum potassium is 3.3 on admission and was replaced with 40 mEq oral potassium - Check mag level with repeat chem panel in am    5. ?UTI  - Urine sent for culture and pt treated with a dose of Rocephin in ED  - UA not convincing for infxn, no bacteria seen, and pt denies dysuria  - Follow culture   DVT prophylaxis: sq Lovenox  Code Status: Full  Family  Communication: Discussed with patient Disposition Plan: Admit to med-surg  Consults called: Orthopedic surgery Admission status: Inpatient    Briscoe Deutscher, MD [facility] Pager [phone]  If 7PM-7AM, please contact night-coverage [email or web address] Password [facility]  01/22/2017, 12:35 AM

## 2017-01-22 NOTE — Anesthesia Procedure Notes (Signed)
Spinal  Patient location during procedure: OR Start time: 01/22/2017 3:19 PM End time: 01/22/2017 3:24 PM Staffing Anesthesiologist: Gaynelle AduFITZGERALD, Chemika Nightengale Performed: anesthesiologist  Preanesthetic Checklist Completed: patient identified, surgical consent, pre-op evaluation, timeout performed, IV checked, risks and benefits discussed and monitors and equipment checked Spinal Block Patient position: right lateral decubitus Prep: DuraPrep Patient monitoring: cardiac monitor, continuous pulse ox and blood pressure Approach: left paramedian Location: L3-4 Injection technique: single-shot Needle Needle type: Quincke  Needle gauge: 22 G Needle length: 9 cm Assessment Sensory level: T8 Additional Notes Functioning IV was confirmed and monitors were applied. Sterile prep and drape, including hand hygiene and sterile gloves were used. The patient was positioned and the spine was prepped. The skin was anesthetized with lidocaine.  Free flow of clear CSF was obtained prior to injecting local anesthetic into the CSF.  The spinal needle aspirated freely following injection.  The needle was carefully withdrawn.  The patient tolerated the procedure well.

## 2017-01-23 DIAGNOSIS — I10 Essential (primary) hypertension: Secondary | ICD-10-CM

## 2017-01-23 LAB — BASIC METABOLIC PANEL
ANION GAP: 8 (ref 5–15)
BUN: 11 mg/dL (ref 6–20)
CALCIUM: 8.3 mg/dL — AB (ref 8.9–10.3)
CO2: 25 mmol/L (ref 22–32)
CREATININE: 0.67 mg/dL (ref 0.44–1.00)
Chloride: 105 mmol/L (ref 101–111)
Glucose, Bld: 87 mg/dL (ref 65–99)
Potassium: 3.9 mmol/L (ref 3.5–5.1)
SODIUM: 138 mmol/L (ref 135–145)

## 2017-01-23 LAB — CBC
HCT: 33.2 % — ABNORMAL LOW (ref 36.0–46.0)
Hemoglobin: 10.6 g/dL — ABNORMAL LOW (ref 12.0–15.0)
MCH: 30.1 pg (ref 26.0–34.0)
MCHC: 31.9 g/dL (ref 30.0–36.0)
MCV: 94.3 fL (ref 78.0–100.0)
PLATELETS: 173 10*3/uL (ref 150–400)
RBC: 3.52 MIL/uL — AB (ref 3.87–5.11)
RDW: 14 % (ref 11.5–15.5)
WBC: 8.3 10*3/uL (ref 4.0–10.5)

## 2017-01-23 LAB — URINE CULTURE: Culture: <10000 — AB

## 2017-01-23 MED ORDER — ASPIRIN EC 325 MG PO TBEC
325.0000 mg | DELAYED_RELEASE_TABLET | Freq: Two times a day (BID) | ORAL | 0 refills | Status: DC
Start: 2017-01-23 — End: 2017-01-27

## 2017-01-23 MED ORDER — TRAMADOL HCL 50 MG PO TABS: 50.0000 mg | ORAL_TABLET | Freq: Four times a day (QID) | ORAL | 0 refills | Status: DC | PRN

## 2017-01-23 MED ORDER — TRAMADOL HCL 50 MG PO TABS
50.0000 mg | ORAL_TABLET | Freq: Four times a day (QID) | ORAL | Status: DC | PRN
  Administered 2017-01-23 – 2017-01-24 (×2): 50 mg via ORAL
  Filled 2017-01-23 (×2): qty 1

## 2017-01-23 MED ORDER — ASPIRIN EC 325 MG PO TBEC
325.0000 mg | DELAYED_RELEASE_TABLET | Freq: Two times a day (BID) | ORAL | Status: DC
  Administered 2017-01-23 – 2017-01-25 (×4): 325 mg via ORAL
  Filled 2017-01-23 (×4): qty 1

## 2017-01-23 MED ORDER — ENSURE ENLIVE PO LIQD
237.0000 mL | Freq: Three times a day (TID) | ORAL | Status: DC
  Administered 2017-01-23 – 2017-01-24 (×4): 237 mL via ORAL

## 2017-01-23 NOTE — Evaluation (Signed)
Occupational Therapy Evaluation Patient Details Name: Jamie Carlson MRN: [id number] DOB: 04-21-1918 Today's Date: 01/23/2017    History of Present Illness Pt is a 83 yo female who was admitted to the hospital with L hip pain after a fall at home. Pt is dx with closed subcapital fx of L femur s/p L cannulated hip pinning 01/22/17. Pt is a poor historian but presents with heart murmer and HTN.     Clinical Impression   Pt is Medicare with recommendation D/C plan is SNF. Pt with cognition deficits and needs redirection for safety. Pt requires mod (A) for RW use and max (A) for bed mobility. Pt voiding bladder several times on 3n1 over toilet and unaware of the need to void until OT initiated the transfer. Pt states "oh I think I have to go". Recommend SNF at this time due to living alone and no family present. Pt would require 24/7 (A) with mod (A) for transfers to return home.    Follow Up Recommendations  SNF    Equipment Recommendations  3 in 1 bedside commode;Hospital bed;Other (comment) (RW)    Recommendations for Other Services       Precautions / Restrictions Precautions Precautions: Fall Precaution Comments: multiple falls Restrictions Weight Bearing Restrictions: Yes LLE Weight Bearing: Weight bearing as tolerated      Mobility Bed Mobility Overal bed mobility: Needs Assistance Bed Mobility: Supine to Sit;Sit to Supine     Supine to sit: Max assist Sit to supine: Total assist   General bed mobility comments: Pt requires (A) with L LE and states [place] easy slow.  Transfers Overall transfer level: Needs assistance Equipment used: Rolling walker (2 wheeled) Transfers: Sit to/from Stand Sit to Stand: Mod assist         General transfer comment: pt needs (A) for safety in RW. pt releasing RW and reaching for therapist instead. pt holding her peri area due to urgency    Balance Overall balance assessment: Needs assistance Sitting-balance support: Bilateral upper  extremity supported;Feet supported Sitting balance-Leahy Scale: Poor     Standing balance support: Bilateral upper extremity supported;During functional activity Standing balance-Leahy Scale: Poor                              ADL Overall ADL's : Needs assistance/impaired Eating/Feeding: Minimal assistance;Bed level Eating/Feeding Details (indicate cue type and reason): (A) due to visual deficits and encouragement to eat. pt reports the IV is feeding her, she cant eat because then she will have to go to the bathroom. pt needs (A) to ensure she is taking in PO intake  Grooming: Wash/dry face;Set up;Sitting                   Toilet Transfer: Moderate assistance;Ambulation;BSC Toilet Transfer Details (indicate cue type and reason): Pt requires (A) due to releasing RW , urgency to void bladder, redirection to task, cues to remain seated and await Help  Toileting- Clothing Manipulation and Hygiene: Minimal assistance;Sit to/from stand       Functional mobility during ADLs: Moderate assistance;Rolling walker General ADL Comments: pt needs redirection to task, [facility] and needs cues due to visual deficits.      Vision Baseline Vision/History: Legally blind       Perception     Praxis      Pertinent Vitals/Pain Pain Assessment: Faces Faces Pain Scale: Hurts even more Pain Location: hip Pain Descriptors / Indicators: Discomfort;Sore Pain Intervention(s): Limited  activity within patient's tolerance;Monitored during session;Premedicated before session;Repositioned     Hand Dominance Right   Extremity/Trunk Assessment Upper Extremity Assessment Upper Extremity Assessment: Generalized weakness   Lower Extremity Assessment Lower Extremity Assessment: Defer to PT evaluation   Cervical / Trunk Assessment Cervical / Trunk Assessment: Kyphotic   Communication Communication Communication: Other (comment);HOH   Cognition Arousal/Alertness: Awake/alert Behavior  During Therapy: Impulsive;Restless Overall Cognitive Status: Impaired/Different from baseline Area of Impairment: Orientation;Attention;Memory;Following commands;Safety/judgement;Awareness Orientation Level: Disoriented to;Place;Time;Situation Current Attention Level: Sustained Memory: Decreased recall of precautions;Decreased short-term memory Following Commands: Follows one step commands with increased time Safety/Judgement: Decreased awareness of safety;Decreased awareness of deficits Awareness: Intellectual   General Comments: pt refers to therapist as "Florentina AddisonKatie" even after education because pt feels she knows patient. pt asking for personal items in the home. pt states "get my sweater from the cabinet by the window. I am cold. " Pt at other points in the session speaks to the hosptial gown and awaiting going to a SNF. Pt states I keep hearing birds and chirps. I feel like I am going crazy. Pt unaware the chirps and sounds are bells and alarms at the hospital    General Comments       Exercises       Shoulder Instructions      Home Living Family/patient expects to be discharged to:: Skilled nursing facility                                        Prior Functioning/Environment Level of Independence: Independent        Comments: community ambulator, walks to [employer]        OT Problem List:        OT Treatment/Interventions:      OT Goals(Current goals can be found in the care plan section) Acute Rehab OT Goals Patient Stated Goal: to get my sweater OT Goal Formulation: Patient unable to participate in goal setting  OT Frequency:     Barriers to D/C:            Co-evaluation              End of Session Equipment Utilized During Treatment: Rolling walker Nurse Communication: Mobility status;Precautions;Weight bearing status  Activity Tolerance: Patient tolerated treatment well Patient left: in bed;with call bell/phone within reach;with bed  alarm set;with nursing/sitter in room  OT Visit Diagnosis: Unsteadiness on feet (R26.81)                ADL either performed or assessed with clinical judgement  Time: 1340-1411 OT Time Calculation (min): 31 min Charges:  OT General Charges $OT Visit: 1 Procedure OT Evaluation $OT Eval Moderate Complexity: 1 Procedure OT Treatments $Self Care/Home Management : 8-22 mins G-Codes:      Mateo Carlson, Jamie   OTR/L Pager: [phone] Office: [phone] .   Boone Carlson, Jamie Carlson 01/23/2017, 2:34 PM

## 2017-01-23 NOTE — Progress Notes (Signed)
PROGRESS NOTE  Jamie Carlson  NFA:[id number] DOB: 06-Aug-1918 DOA: 01/21/2017 PCP: No primary care provider on file. Outpatient Specialists:  Subjective: Hard of hearing, denies any pain this morning.  Brief Narrative:  Jamie Carlson is a 83 y.o. female with little known past medical history who does not take any medications and presents to the emergency department with severe left hip pain following an unwitnessed fall. Patient reports that she was in her usual state of health and having an uneventful day before tripping over something and falling onto her left side. She reports severe pain at the left thigh immediately after the fall, worse with any attempted movement of the leg. She denies striking her head and is not on any anticoagulant. She denies frequent falls. She denies recent fevers or chills, denies chest pain or palpitations, and denies cough or dyspnea. There is been no recent abdominal pain, nausea, vomiting, or diarrhea. She also denies melena or hematochezia. Surgical history includes a remote cesarean section which she reportedly tolerated without incident.  Assessment & Plan:   Principal Problem:   Closed subcapital fracture of femur, left, initial encounter [facility]) Active Problems:   Essential hypertension   Hypertensive urgency   Hypokalemia   Cardiac murmur   Closed left subcapital femur fracture  -Presents with severe left thigh pain after a mechanical fall, found to have subcapital fracture. -She has a loud systolic murmur at upper sternal border concerning for severe valvular disease. -2-D echo was done and showed normal LVEF with mild AS. -Underwent cannulated hip pinning done by Dr. Ave Filter on 01/22/2017.  Cardiac murmur -As above, pt has loud murmur not previously evaluated and will undergo TTE prior to surgery  -2-D echo was done and showed normal LVEF with mild AS.  Hypertensive urgency  - BP in 190/110 range on admission  - Pain is likely contributing;  pt not on antihypertensives at home - This is improving with IV hydralazine. Blood pressure is 160/90 this morning  Hypokalemia  - Serum potassium is 3.3 on admission and was replaced with 40 mEq oral potassium - Check mag level with repeat chem panel in am    ?UTI  - Urine sent for culture and pt treated with a dose of Rocephin in ED  - UA not convincing for infxn, no bacteria seen, and pt denies dysuria  - Follow culture  Acute blood loss anemia -Hemoglobin dropped from 13.9 on admission down to 10.6. -This is likely secondary to perioperative blood loss plus hemodilution from IV fluids. Check CBC in a.m.   DVT prophylaxis: Aspirin twice a day and SCDs Code Status: DNR Family Communication:  Disposition Plan: SNF Diet: DIET DYS 3 Room service appropriate? Yes; Fluid consistency: Thin  Consultants:   None  Procedures:   None  Antimicrobials:   None   Objective: Vitals:   01/22/17 1718 01/22/17 1744 01/22/17 2228 01/23/17 0608  BP: (!) 161/89 (!) 160/71 (!) 162/78 132/72  Pulse: 86 97 66 74  Resp: 18 20 18 18   Temp: 97.6 F (36.4 C) 97.8 F (36.6 C) 98.5 F (36.9 C) 98.2 F (36.8 C)  TempSrc:   Oral Oral  SpO2: 100% 91% 100% 95%    Intake/Output Summary (Last 24 hours) at 01/23/17 1049 Last data filed at 01/23/17 0900  Gross per 24 hour  Intake              520 ml  Output  450 ml  Net               70 ml   There were no vitals filed for this visit.  Examination: General exam: Appears calm and comfortable  Respiratory system: Clear to auscultation. Respiratory effort normal. Cardiovascular system: S1 & S2 heard, RRR. No JVD, murmurs, rubs, gallops or clicks. No pedal edema. Gastrointestinal system: Abdomen is nondistended, soft and nontender. No organomegaly or masses felt. Normal bowel sounds heard. Central nervous system: Alert and oriented. No focal neurological deficits. Extremities: Symmetric 5 x 5 power. Skin: No rashes, lesions or  ulcers Psychiatry: Judgement and insight appear normal. Mood & affect appropriate.   Data Reviewed: I have personally reviewed following labs and imaging studies  CBC:  Recent Labs Lab 01/21/17 2303 01/23/17 0457  WBC 12.5* 8.3  NEUTROABS 10.7*  --   HGB 13.9 10.6*  HCT 40.8 33.2*  MCV 89.7 94.3  PLT 259 173   Basic Metabolic Panel:  Recent Labs Lab 01/21/17 2303 01/23/17 0457  NA 139 138  K 3.3* 3.9  CL 104 105  CO2 25 25  GLUCOSE 112* 87  BUN 14 11  CREATININE 0.70 0.67  CALCIUM 9.5 8.3*   GFR: CrCl cannot be calculated (Unknown ideal weight.). Liver Function Tests:  Recent Labs Lab 01/21/17 2303  AST 24  ALT 21  ALKPHOS 83  BILITOT 0.8  PROT 7.4  ALBUMIN 4.3   No results for input(s): LIPASE, AMYLASE in the last 168 hours. No results for input(s): AMMONIA in the last 168 hours. Coagulation Profile:  Recent Labs Lab 01/21/17 2303  INR 0.95   Cardiac Enzymes: No results for input(s): CKTOTAL, CKMB, CKMBINDEX, TROPONINI in the last 168 hours. BNP (last 3 results) No results for input(s): PROBNP in the last 8760 hours. HbA1C: No results for input(s): HGBA1C in the last 72 hours. CBG: No results for input(s): GLUCAP in the last 168 hours. Lipid Profile: No results for input(s): CHOL, HDL, LDLCALC, TRIG, CHOLHDL, LDLDIRECT in the last 72 hours. Thyroid Function Tests: No results for input(s): TSH, T4TOTAL, FREET4, T3FREE, THYROIDAB in the last 72 hours. Anemia Panel: No results for input(s): VITAMINB12, FOLATE, FERRITIN, TIBC, IRON, RETICCTPCT in the last 72 hours. Urine analysis:    Component Value Date/Time   COLORURINE STRAW (A) 01/21/2017 2236   APPEARANCEUR CLEAR 01/21/2017 2236   LABSPEC 1.004 (L) 01/21/2017 2236   PHURINE 6.0 01/21/2017 2236   GLUCOSEU NEGATIVE 01/21/2017 2236   HGBUR MODERATE (A) 01/21/2017 2236   BILIRUBINUR NEGATIVE 01/21/2017 2236   KETONESUR NEGATIVE 01/21/2017 2236   PROTEINUR NEGATIVE 01/21/2017 2236    UROBILINOGEN 1.0 06/22/2013 1458   NITRITE NEGATIVE 01/21/2017 2236   LEUKOCYTESUR SMALL (A) 01/21/2017 2236   Sepsis Labs: @LABRCNTIP (procalcitonin:4,lacticidven:4)  ) Recent Results (from the past 240 hour(s))  Urine culture     Status: Abnormal   Collection Time: 01/21/17 10:36 PM  Result Value Ref Range Status   Specimen Description URINE, CLEAN CATCH  Final   Special Requests NONE  Final   Culture (A)  Final    <10,000 COLONIES/mL INSIGNIFICANT GROWTH Performed at [facility], [address], [place], [place] [address]    Report Status 01/23/2017 FINAL  Final  Surgical pcr screen     Status: Abnormal   Collection Time: 01/22/17  5:24 AM  Result Value Ref Range Status   MRSA, PCR POSITIVE (A) NEGATIVE Final   Staphylococcus aureus POSITIVE (A) NEGATIVE Final    Comment:  The Xpert SA Assay (FDA approved for NASAL specimens in patients over 58 years of age), is one component of a comprehensive surveillance program.  Test performance has been validated by [facility] for patients greater than or equal to 60 year old. It is not intended to diagnose infection nor to guide or monitor treatment. RESULT CALLED TO, READ BACK BY AND VERIFIED WITH: [facility] AT 0715 01/22/17 BY L BENFIELD      Invalid input(s): PROCALCITONIN, LACTICACIDVEN   Radiology Studies: Dg Chest 2 View  Result Date: 01/21/2017 CLINICAL DATA:  LEFT femoral neck fracture EXAM: CHEST  2 VIEW COMPARISON:  None. FINDINGS: Normal cardiac silhouette. Lungs are hyperinflated. No effusion, infiltrate pneumothorax. IMPRESSION: Hyperinflated lungs without acute findings. Electronically Signed   By: Genevive Bi M.D.   On: 01/21/2017 21:56   Dg Knee 2 Views Left  Result Date: 01/21/2017 CLINICAL DATA:  Fall-left leg injury. Pt is presented from home by medics who report that that pt had an unwitnessed fall that she, pt who is AOx4 but hard of hearing, reports she "tripped and fell." She  exhibiting some degree of discomfort EXAM: LEFT KNEE - 1-2 VIEW COMPARISON:  None. FINDINGS: No fracture of the proximal tibia or distal femur. Patella is normal. No joint effusion. IMPRESSION: No fracture or dislocation. Electronically Signed   By: Genevive Bi M.D.   On: 01/21/2017 21:53   Pelvis Portable  Result Date: 01/22/2017 CLINICAL DATA:  Status post left femoral neck fracture fixation. EXAM: PORTABLE PELVIS 1-2 VIEWS COMPARISON:  None. FINDINGS: There has been an a fixation of left sub capitellar femoral neck fracture with 3 cancellous screws placement. The left hip is located. Fracture line is seen through the left femoral neck. No other fractures are seen. Mild osteopenia. IMPRESSION: Left sub capitellar femoral neck fracture fixation without evidence of immediate complications. Electronically Signed   By: Ted Mcalpine M.D.   On: 01/22/2017 17:11   Dg C-arm 1-60 Min  Result Date: 01/22/2017 CLINICAL DATA:  Left hip fracture repair. EXAM: DG C-ARM 61-120 MIN; OPERATIVE LEFT HIP WITH PELVIS FLUOROSCOPY TIME:  51 seconds Images: 2 COMPARISON:  None. FINDINGS: The intraoperative images demonstrate the placement of 3 screws across the left hip fracture. The postoperative film demonstrates 3 screws across the nondisplaced left hip fracture. IMPRESSION: Left hip fracture repair as above. Electronically Signed   By: Gerome Sam III M.D   On: 01/22/2017 18:10   Dg Hip Operative Unilat W Or W/o Pelvis Left  Result Date: 01/22/2017 CLINICAL DATA:  Left hip fracture repair. EXAM: DG C-ARM 61-120 MIN; OPERATIVE LEFT HIP WITH PELVIS FLUOROSCOPY TIME:  51 seconds Images: 2 COMPARISON:  None. FINDINGS: The intraoperative images demonstrate the placement of 3 screws across the left hip fracture. The postoperative film demonstrates 3 screws across the nondisplaced left hip fracture. IMPRESSION: Left hip fracture repair as above. Electronically Signed   By: Gerome Sam III M.D   On:  01/22/2017 18:10   Dg Hip Unilat W Or Wo Pelvis 2-3 Views Left  Result Date: 01/21/2017 CLINICAL DATA:  Fall, discomfort. EXAM: DG HIP (WITH OR WITHOUT PELVIS) 2-3V LEFT COMPARISON:  None. FINDINGS: There is a fracture of the LEFT femoral neck with shortening. Fracture is in a subcapital location. Hip joint remains intact. IMPRESSION: Subcapital fracture of the LEFT femoral neck with shortening. Electronically Signed   By: Genevive Bi M.D.   On: 01/21/2017 21:55   Dg Femur Min 2 Views Left  Result Date:  01/21/2017 CLINICAL DATA:  Fall-left leg injury. Pt is presented from home by medics who report that that pt had an unwitnessed fall that she, pt who is AOx4 but hard of hearing, reports she "tripped and fell." She exhibiting some degree of discomfort on h.*comment was truncated* EXAM: LEFT FEMUR 2 VIEWS COMPARISON:  None. FINDINGS: Limited view of the distal LEFT femur demonstrates no fracture. Proximal femur not imaged. IMPRESSION: No fracture distal femur. Electronically Signed   By: Genevive BiStewart  Edmunds M.D.   On: 01/21/2017 21:53        Scheduled Meds: . aspirin EC  325 mg Oral Q breakfast   Continuous Infusions:    LOS: 1 day    Time spent: 35 minutes    Tayvien Kane A, MD [facility] Pager [phone]  If 7PM-7AM, please contact night-coverage [email or web address] Password [email or web address] 01/23/2017, 10:49 AM

## 2017-01-23 NOTE — Evaluation (Signed)
Clinical/Bedside Swallow Evaluation Patient Details  Name: Jamie Carlson MRN: [id number] Date of Birth: [birth year]  Today's Date: 01/23/2017 Time: SLP Start Time (ACUTE ONLY): 0825 SLP Stop Time (ACUTE ONLY): 0835 SLP Time Calculation (min) (ACUTE ONLY): 10 min  Past Medical History:  Past Medical History:  Diagnosis Date  . Hypertension   . Urinary tract infection    Past Surgical History:  Past Surgical History:  Procedure Laterality Date  . CESAREAN SECTION     HPI:  [name] a 83 y.o.femalewith PMH of HTN and UTI who does not take any medications and presents to the ED with severe left hip pain following a fall. Patient reports that she was in her usual state of health and having an uneventful day before tripping over something and falling onto her left side. Pelvis Xray showed left hip fracture. CXR showed hyperinflated lungs without acute findings.   Assessment / Plan / Recommendation Clinical Impression  Ms. Garald BraverGehrke was not oriented to time (asked if it was the afternoon) and required encouragment to participate with SLP for bedside swallow evaluation. A full oral motor assessment was unable to be completed due to pt's lack of cooperation although SLP did note that pt was edentulous. Cup sips of thin liquids resulted in immediate coughing and pt stated that she "choked", but she explained that this does not normally occur when drinking thin liquids. Pt refused to participate in the rest of the evaluation and explained that she is "fine" and does not want to eat or drink anything at the moment. Due to pt's age and requests, recommend continuing with current diet of Dys 3 solids and thin liquids (no straws), meds whole with thin liquids. Educated pt and RN re: diet recommendations and risk of aspiration with thin liquids. No ST treatment needed at this time; ST signing off. SLP Visit Diagnosis: Dysphagia, unspecified (R13.10)    Aspiration Risk  Moderate aspiration risk    Diet  Recommendation Dysphagia 3 (Mech soft);Thin liquid   Liquid Administration via: Cup;No straw Medication Administration: Whole meds with liquid Supervision: Staff to assist with self feeding;Intermittent supervision to cue for compensatory strategies Compensations: Slow rate;Small sips/bites Postural Changes: Seated upright at 90 degrees    Other  Recommendations Oral Care Recommendations: Oral care BID   Follow up Recommendations Skilled Nursing facility      Frequency and Duration            Prognosis Prognosis for Safe Diet Advancement: Good Barriers to Reach Goals: Cognitive deficits      Swallow Study   General HPI: [name] a 83 y.o.femalewith PMH of HTN and UTI who does not take any medications and presents to the ED with severe left hip pain following a fall. Patient reports that she was in her usual state of health and having an uneventful day before tripping over something and falling onto her left side. Pelvis Xray showed left hip fracture. CXR showed hyperinflated lungs without acute findings. Type of Study: Bedside Swallow Evaluation Previous Swallow Assessment: none noted Diet Prior to this Study: Thin liquids;Dysphagia 3 (soft) Temperature Spikes Noted: No Respiratory Status: Room air History of Recent Intubation: No Behavior/Cognition: Alert;Confused;Requires cueing;Doesn't follow directions Oral Cavity Assessment: Dry Oral Care Completed by SLP: Other (Comment) (pt used wet toothbrush) Oral Cavity - Dentition: Edentulous Vision: Impaired for self-feeding Self-Feeding Abilities: Needs assist;Needs set up Patient Positioning: Upright in bed Baseline Vocal Quality: Normal Volitional Cough: Cognitively unable to elicit Volitional Swallow: Unable to elicit    Oral/Motor/Sensory  Function Overall Oral Motor/Sensory Function: Other (comment) (unable to assess due to lack of cooperation from pt)   Ice Chips Ice chips: Not tested   Thin Liquid Thin Liquid:  Impaired Presentation: Cup;Self Fed Oral Phase Impairments:  (none) Oral Phase Functional Implications:  (none) Pharyngeal  Phase Impairments: Cough - Immediate    Nectar Thick Nectar Thick Liquid: Not tested   Honey Thick Honey Thick Liquid: Not tested   Puree Puree: Not tested (attempted to test)   Solid   GO   Solid: Not tested        Macarthur Critchley , Student-SLP 01/23/2017,9:18 AM

## 2017-01-23 NOTE — NC FL2 (Signed)
[place] MEDICAID FL2 LEVEL OF CARE SCREENING TOOL     IDENTIFICATION  Patient Name: Jamie Carlson Birthdate: Jan 08, 1918 Sex: female Admission Date (Current Location): 01/21/2017  [facility] and [place] Number:  Producer, television/film/video and Address:  [facility], [address] [place], [place] [address]      Provider Number: [id number]  Attending Physician Name and Address:  Clydia Llano, MD  Relative Name and Phone Number:       Current Level of Care: Hospital Recommended Level of Care: Skilled Nursing Facility Prior Approval Number:    Date Approved/Denied:   PASRR Number: [id number]   Discharge Plan: SNF    Current Diagnoses: Patient Active Problem List   Diagnosis Date Noted  . Essential hypertension 01/22/2017  . Hypertensive urgency 01/22/2017  . Hypokalemia 01/22/2017  . Closed subcapital fracture of femur, left, initial encounter (HCC) 01/22/2017  . Cardiac murmur 01/22/2017  . Cystitis   . Fall     Orientation RESPIRATION BLADDER Height & Weight     Self, Place  Normal Incontinent Weight: 70 lb 12.8 oz (32.1 kg) (Per bed scale) Height:  4\' 11"  (149.9 cm) (Per "son")  BEHAVIORAL SYMPTOMS/MOOD NEUROLOGICAL BOWEL NUTRITION STATUS      Continent  (Please see discharge summary)  AMBULATORY STATUS COMMUNICATION OF NEEDS Skin   Extensive Assist Verbally Surgical wounds (Closed incision left thigh, adhesive bandage)                       Personal Care Assistance Level of Assistance  Bathing, Feeding, Dressing Bathing Assistance: Maximum assistance Feeding assistance: Limited assistance Dressing Assistance: Maximum assistance     Functional Limitations Info  Sight, Hearing, Speech Sight Info: Adequate Hearing Info: Adequate Speech Info: Adequate    SPECIAL CARE FACTORS FREQUENCY  PT (By licensed PT), OT (By licensed OT)     PT Frequency: 3x  OT Frequency: 3x             Contractures Contractures Info: Not present     Additional Factors Info  Code Status, Allergies, Isolation Precautions Code Status Info: DNR Allergies Info: Sulfa Antibiotics, Bee Venom     Isolation Precautions Info: Contact, MRSA     Current Medications (01/23/2017):  This is the current hospital active medication list Current Facility-Administered Medications  Medication Dose Route Frequency Provider Last Rate Last Dose  . acetaminophen (TYLENOL) tablet 650 mg  650 mg Oral Q6H PRN Jiles Harold, PA-C   650 mg at 01/22/17 2142   Or  . acetaminophen (TYLENOL) suppository 650 mg  650 mg Rectal Q6H PRN Jiles Harold, PA-C      . aspirin EC tablet 325 mg  325 mg Oral BID Clydia Llano, MD      . bisacodyl (DULCOLAX) suppository 10 mg  10 mg Rectal Daily PRN Briscoe Deutscher, MD      . feeding supplement (ENSURE ENLIVE) (ENSURE ENLIVE) liquid 237 mL  237 mL Oral TID BM Clydia Llano, MD      . hydrALAZINE (APRESOLINE) injection 5 mg  5 mg Intravenous Q4H PRN Briscoe Deutscher, MD      . menthol-cetylpyridinium (CEPACOL) lozenge 3 mg  1 lozenge Oral PRN Jiles Harold, PA-C       Or  . phenol (CHLORASEPTIC) mouth spray 1 spray  1 spray Mouth/Throat PRN Jiles Harold, PA-C      . metoCLOPramide (REGLAN) tablet 5-10 mg  5-10 mg Oral Q8H PRN Jiles Harold, PA-C  Or  . metoCLOPramide (REGLAN) injection 5-10 mg  5-10 mg Intravenous Q8H PRN Jiles Haroldanielle Laliberte, PA-C      . ondansetron (ZOFRAN) tablet 4 mg  4 mg Oral Q6H PRN Jiles Haroldanielle Laliberte, PA-C       Or  . ondansetron (ZOFRAN) injection 4 mg  4 mg Intravenous Q6H PRN Danielle Laliberte, PA-C      . polyethylene glycol (MIRALAX / GLYCOLAX) packet 17 g  17 g Oral Daily PRN Briscoe Deutscherimothy S Opyd, MD      . traMADol (ULTRAM) tablet 50 mg  50 mg Oral Q6H PRN Jiles Haroldanielle Laliberte, PA-C         Discharge Medications: Please see discharge summary for a list of discharge medications.  Relevant Imaging Results:  Relevant Lab Results:   Additional Information SSN:  [id number]  Maree KrabbeBridget A Jaretzy Lhommedieu, LCSW

## 2017-01-23 NOTE — Progress Notes (Signed)
Pt pulled out her IV from left lateral Antecubital resulting in a skin tear.  Skin cleanse  And tegaderm applied.Pt also pulled out her urinary catheter. Confusion notes. Will continue to monitor.

## 2017-01-23 NOTE — Progress Notes (Signed)
   PATIENT ID: Jamie Carlson   1 Day Post-Op Procedure(s) (LRB): CANNULATED HIP PINNING (Left)  Subjective: Reports no pain, comfortable.  Objective:  Vitals:   01/22/17 2228 01/23/17 0608  BP: (!) 162/78 132/72  Pulse: 66 74  Resp: 18 18  Temp: 98.5 F (36.9 C) 98.2 F (36.8 C)     L hip dressing c/d/I Wiggles toes  Labs:   Recent Labs  01/21/17 2303 01/23/17 0457  HGB 13.9 10.6*   Recent Labs  01/21/17 2303 01/23/17 0457  WBC 12.5* 8.3  RBC 4.55 3.52*  HCT 40.8 33.2*  PLT 259 173   Recent Labs  01/21/17 2303 01/23/17 0457  NA 139 138  K 3.3* 3.9  CL 104 105  CO2 25 25  BUN 14 11  CREATININE 0.70 0.67  GLUCOSE 112* 87  CALCIUM 9.5 8.3*    Assessment and Plan: 1 day s/p left hip pinning WBAT ASA 325mg  BID Minimize pain medicine, tramadol only as needed, rec tylenol if tolerated Up with PT today Appreciate medicine following  VTE proph: ASA, SCDs

## 2017-01-23 NOTE — Evaluation (Signed)
Physical Therapy Evaluation Patient Details Name: Jamie Carlson MRN: [id number] DOB: Dec 10, 1917 Today's Date: 01/23/2017   History of Present Illness  Pt is a 83 yo female who was admitted to the hospital with L hip pain after a fall at home. Pt is dx with closed subcapital fx of L femur s/p L cannulated hip pinning 01/22/17. Pt is a poor historian but presents with heart murmer and HTN.    Clinical Impression  Patient is s/p above surgery resulting in functional limitations due to the deficits listed below (see PT Problem List). Pt is limited by decreased balance and safety awareness.  Patient lives alone and does not have adequate support to safely discharge home due to Recommend skilled nursing facility for post-acute therapy needs. Patient will benefit from skilled PT to increase their independence and safety with mobility to allow discharge to the venue listed below.      Follow Up Recommendations SNF;Supervision/Assistance - 24 hour    Equipment Recommendations   (TBD at next facility)    Recommendations for Other Services OT consult     Precautions / Restrictions Precautions Precautions: Fall Precaution Comments: multiple falls Restrictions Weight Bearing Restrictions: Yes LLE Weight Bearing: Weight bearing as tolerated      Mobility  Bed Mobility Overal bed mobility: Modified Independent;Needs Assistance Bed Mobility: Supine to Sit     Supine to sit: Min assist     General bed mobility comments: for scooting hips EoB  Transfers Overall transfer level: Needs assistance Equipment used: Rolling walker (2 wheeled) Transfers: Sit to/from Stand Sit to Stand: Min assist         General transfer comment: Pt is unaware of painful limitation of L hip and had LoB requiring minAx to recover  Ambulation/Gait Ambulation/Gait assistance: Mod assist Ambulation Distance (Feet): 3 Feet Assistive device: Rolling walker (2 wheeled) Gait Pattern/deviations: Step-to  pattern;Shuffle;Decreased step length - right;Decreased step length - left;Decreased stance time - left Gait velocity: slow Gait velocity interpretation: Below normal speed for age/gender General Gait Details: very impulsive, shuffling gait to chair with limited WB through LLE then had cramp in R LE and lifted RLE up to rub R calf, LoB when L LE became painful requiring mod Ax1 to recover       Balance Overall balance assessment: Needs assistance Sitting-balance support: Feet unsupported;Bilateral upper extremity supported Sitting balance-Leahy Scale: Poor     Standing balance support: Bilateral upper extremity supported Standing balance-Leahy Scale: Poor Standing balance comment: lack of awarness of condition caused LoB when WB through L LE                              Pertinent Vitals/Pain Pain Assessment: Faces Faces Pain Scale: Hurts little more Pain Location: hip Pain Intervention(s): Monitored during session  VSS    Home Living Family/patient expects to be discharged to:: Private residence Living Arrangements: Alone Available Help at Discharge:  (None available 24hr/day per son) Type of Home: House Home Access: Stairs to enter Entrance Stairs-Rails: Right Entrance Stairs-Number of Steps: 2 Home Layout: One level Home Equipment:  (walking stick)      Prior Function Level of Independence: Independent         Comments: community ambulator, walks to [employer]        Extremity/Trunk Assessment        Lower Extremity Assessment Lower Extremity Assessment: Generalized weakness;LLE deficits/detail LLE Deficits / Details: hip ROM limited by pain, all  other L joints ROM WFL LLE: Unable to fully assess due to pain    Cervical / Trunk Assessment Cervical / Trunk Assessment: Kyphotic  Communication   Communication: Other (comment) (Hard of hearing talk to L ear, difficult to keep on subject)  Cognition Arousal/Alertness: Awake/alert Behavior During  Therapy: Anxious (about having to go to SNF) Overall Cognitive Status: Difficult to assess                 General Comments: Pt difficult to stay on subject during interview, decreased awarness of condition of L hip    General Comments General comments (skin integrity, edema, etc.): Pt has poor awarness of condition and adapting to L LE pain    Exercises General Exercises - Lower Extremity Ankle Circles/Pumps: AROM;Both;10 reps;Seated Long Arc Quad: AROM;Both;10 reps;Seated   Assessment/Plan    PT Assessment Patient needs continued PT services  PT Problem List Decreased balance;Decreased activity tolerance;Decreased mobility;Decreased cognition;Decreased safety awareness;Decreased knowledge of use of DME;Pain;Decreased knowledge of precautions       PT Treatment Interventions Gait training;DME instruction;Functional mobility training;Therapeutic activities;Therapeutic exercise;Balance training;Patient/family education;Cognitive remediation    PT Goals (Current goals can be found in the Care Plan section)  Acute Rehab PT Goals Patient Stated Goal: go home PT Goal Formulation: With patient/family Time For Goal Achievement: 02/06/17 Potential to Achieve Goals: Fair    Frequency Min 3X/week   Barriers to discharge Decreased caregiver support         End of Session Equipment Utilized During Treatment: Gait belt Activity Tolerance: Patient limited by pain Patient left: in chair;with call bell/phone within reach;with chair alarm set;with family/visitor present Nurse Communication: Mobility status PT Visit Diagnosis: Repeated falls (R29.6);Other abnormalities of gait and mobility (R26.89);Pain Pain - Right/Left: Left Pain - part of body: Leg         Time: [id number] PT Time Calculation (min) (ACUTE ONLY): 48 min   Charges:   PT Evaluation $PT Eval Moderate Complexity: 1 Procedure PT Treatments $Gait Training: 8-22 mins $Therapeutic Activity: 8-22 mins   PT G  Codes:         Elon Alaslizabeth B Van Fleet 01/23/2017, 9:59 AM  Courtney ParisElizabeth B. Beverely RisenVan Fleet PT, DPT Acute Rehabilitation  [phone] Pager [phone]

## 2017-01-23 NOTE — Clinical Social Work Placement (Addendum)
   CLINICAL SOCIAL WORK PLACEMENT  NOTE  Date:  01/23/2017  Patient Details  Name: Jamie Carlson MRN: [id number] Date of Birth: May 26, 1918  Clinical Social Work is seeking post-discharge placement for this patient at the Skilled  Nursing Facility level of care (*CSW will initial, date and re-position this form in  chart as items are completed):      Patient/family provided with [facility] Clinical Social Work Department's list of facilities offering this level of care within the geographic area requested by the patient (or if unable, by the patient's family).  Yes   Patient/family informed of their freedom to choose among providers that offer the needed level of care, that participate in Medicare, Medicaid or managed care program needed by the patient, have an available bed and are willing to accept the patient.      Patient/family informed of [place]'s ownership interest in [facility] and [facility], as well as of the fact that they are under no obligation to receive care at these facilities.  PASRR submitted to EDS on       PASRR number received on 01/23/17     Existing PASRR number confirmed on       FL2 transmitted to all facilities in geographic area requested by pt/family on 01/23/17     FL2 transmitted to all facilities within larger geographic area on       Patient informed that his/her managed care company has contracts with or will negotiate with certain facilities, including the following:            Patient/family informed of bed offers received.  [facility]     Physician recommends and patient chooses bed at      [facility].  Patient to be transferred to facility by [facility]     Patient family notified on 3/21 of transfer.  Name of family member notified:  [name]-son     PHYSICIAN Please prepare priority discharge summary, including medications, Please prepare prescriptions, Please sign FL2, Please  sign DNR     Additional Comment:    _______________________________________________ Maree KrabbeBridget A Amadeus Oyama, LCSW 01/23/2017, 4:10 PM

## 2017-01-23 NOTE — Progress Notes (Signed)
Initial Nutrition Assessment  DOCUMENTATION CODES:   Severe malnutrition in context of social or environmental circumstances  INTERVENTION:  - Ensure Enlive po TID, each supplement provides 350 calories and 20 gram protein  NUTRITION DIAGNOSIS:   Malnutrition related to social / environmental circumstances as evidenced by severe depletion of muscle mass, severe depletion of body fat.  GOAL:   Patient will meet greater than or equal to 90% of their needs  MONITOR:   PO intake, Supplement acceptance, I & O's, Weight trends, Labs  REASON FOR ASSESSMENT:   Consult Hip fracture protocol  ASSESSMENT:   83 y.o.female with little known PMH not on medications presents with severe left hip pain s/p an unwitnessed fall   Pt is s/p L cannulated hip pinning 01/22/17  Pt not oriented to time of visit (asked if it was Wednesday)  Pt's son at bedside on business call for most of visit.Pt's son reports he travels often for business and not able to provide extensive pt history of intake or weight status.   Per chart, pt lives alone and cannot properly care for herself. This likely means pt cannot prepare meals for herself. Pt may not have adequate support to continue living at home alone.   Per chart review pt has no extensive weight history recorded. No weight was retrieved for pt upon admission. Per bed scale, pt weighs 70.8 lbs. Pt's son is unsure of UBW  for pt but reports pt has "lost a tremendous amount"  No height recorded since admission. Per son, pt's height is ~ 4'11'' (1.5 m)  Per chart review, pt refused breakfast this morning (3/19) Per son, he got pt to eat a couple bites of egg and applesauce. Pt reports she "ate for the first time in 2 days" and reports a distended stomach.  Per RN, pt did not eat applesauce which had Asprin in it. At time of visit pt reports she was in pain.  Pt's son request Ensure be provided as she is not and has not been eating well.  Labs and  medications reviewed Nutrition-focused physical exam completed. Findings include severe fat depletion, severe muscle depletion and no edema  Diet Order:  DIET DYS 3 Room service appropriate? Yes; Fluid consistency: Thin  Skin:  Reviewed, no issues  Last BM:  3/17  Height:   Ht Readings from Last 1 Encounters:  01/23/17 4\' 11"  (1.499 m)  Per "son"  Weight:   Wt Readings from Last 1 Encounters:  01/23/17 70 lb 12.8 oz (32.1 kg)  Per bed scale  Ideal Body Weight:  44.5 kg  BMI:  Body mass index is 14.3 kg/m.  Estimated Nutritional Needs:   Kcal:  1200-1400  Protein:  50-60 grams  Fluid:  >/= 1.2 L/d  EDUCATION NEEDS:   Education needs no appropriate at this time  [employer] Ioannides Dietetic Intern

## 2017-01-23 NOTE — Clinical Social Work Note (Signed)
Clinical Social Work Assessment  Patient Details  Name: Jamie Carlson MRN: [id number] Date of Birth: 1918/07/14  Date of referral:  01/23/17               Reason for consult:  Facility Placement                Permission sought to share information with:    Permission granted to share information::     Name::     Merlyn AlbertFred  Agency::     Relationship::  Son  SolicitorContact Information:     Housing/Transportation Living arrangements for the past 2 months:  Single Family Home Source of Information:  Adult Children Patient Interpreter Needed:  None Criminal Activity/Legal Involvement Pertinent to Current Situation/Hospitalization:  No - Comment as needed Significant Relationships:  Adult Children Lives with:  Self Do you feel safe going back to the place where you live?    Need for family participation in patient care:     Care giving concerns:  Pt's son present at bedside during initial assessment. Pt is only alert to self and place.    Social Worker assessment / plan:  CSW spoke with pt's son at bedside to complete initial assessment. Pt is only alert to self and place. CSW attempted to speak to pt however, pt did not respond. Pt's son reports pt has declined dramatically since fall. Pt lives home alone and pt's son would take her shopping and to run errands. Pt's son reports pt wont eat. Pt's son became tearful. Pt son is not familiar with SNFs. Pt's son agreeable to [facility] and CSW will present pt's son with a list of beds available once determined.   Employment status:  Retired Health and safety inspectornsurance information:  Medicare PT Recommendations:  Skilled Nursing Facility Information / Referral to community resources:  Skilled Nursing Facility  Patient/Family's Response to care:  Pt's son verbalized understanding of CSW role and expressed appreciation for support. Pt's son denies any concern regarding pt care at this time.   Patient/Family's Understanding of and Emotional Response to  Diagnosis, Current Treatment, and Prognosis:  Pt's son understanding and realistic regarding physical limitations. Pt's son understands the need for SNF placement at d/c. Pt's son agreeable to SNF placement at d/c, at this time.  Pt's son denies any concern regarding treatment plan at this time. CSW will continue to provide support and facilitate d/c needs.   Emotional Assessment Appearance:  Appears stated age Attitude/Demeanor/Rapport:  Unable to Assess Affect (typically observed):  Unable to Assess Orientation:  Oriented to Self, Oriented to Place Alcohol / Substance use:  Not Applicable Psych involvement (Current and /or in the community):  No (Comment)  Discharge Needs  Concerns to be addressed:  Care Coordination Readmission within the last 30 days:  No Current discharge risk:  Dependent with Mobility Barriers to Discharge:  Continued Medical Work up   Pacific MutualBridget A Shatasia Cutshaw, LCSW 01/23/2017, 4:00 PM

## 2017-01-23 NOTE — Discharge Instructions (Signed)
Discharge Instructions after Hip Surgery ° ° °You can bear weight and ambulate as tolerated on both legs °Use ice on the hip intermittently over the first 48 hours after surgery.  °Pain medicine has been prescribed for you.  °Use your medicine liberally over the first 48 hours, and then you can begin to taper your use. You may take Extra Strength Tylenol or Tylenol only in place of the pain pills. DO NOT take ANY nonsteroidal anti-inflammatory pain medications: Advil, Motrin, Ibuprofen, Aleve, Naproxen or Naprosyn.  °Take aspirin or anticoagulant medication as prescribed for 2 weeks after surgery. Please notify if allergic or sensitivity to aspirin. °You may remove your dressing after two days.  °You may shower 5 days after surgery. The incisions CANNOT get wet prior to 5 days. Simply allow the water to wash over the site and then pat dry. Do not rub the incisions. ° ° ° °Please call [phone] during normal business hours or [phone] after hours for any problems. Including the following: ° °- excessive redness of the incisions °- drainage for more than 4 days °- fever of more than 101.5 F ° °*Please note that pain medications will not be refilled after hours or on weekends. ° °

## 2017-01-24 ENCOUNTER — Encounter (HOSPITAL_COMMUNITY): Payer: Self-pay | Admitting: Orthopedic Surgery

## 2017-01-24 DIAGNOSIS — Z8781 Personal history of (healed) traumatic fracture: Secondary | ICD-10-CM

## 2017-01-24 DIAGNOSIS — E43 Unspecified severe protein-calorie malnutrition: Secondary | ICD-10-CM | POA: Insufficient documentation

## 2017-01-24 DIAGNOSIS — T148XXA Other injury of unspecified body region, initial encounter: Secondary | ICD-10-CM

## 2017-01-24 LAB — CBC
HCT: 32.2 % — ABNORMAL LOW (ref 36.0–46.0)
HEMOGLOBIN: 10.4 g/dL — AB (ref 12.0–15.0)
MCH: 29.9 pg (ref 26.0–34.0)
MCHC: 32.3 g/dL (ref 30.0–36.0)
MCV: 92.5 fL (ref 78.0–100.0)
PLATELETS: 191 10*3/uL (ref 150–400)
RBC: 3.48 MIL/uL — ABNORMAL LOW (ref 3.87–5.11)
RDW: 13.6 % (ref 11.5–15.5)
WBC: 9.1 10*3/uL (ref 4.0–10.5)

## 2017-01-24 LAB — BASIC METABOLIC PANEL
Anion gap: 8 (ref 5–15)
BUN: 14 mg/dL (ref 6–20)
CO2: 28 mmol/L (ref 22–32)
CREATININE: 0.69 mg/dL (ref 0.44–1.00)
Calcium: 8.6 mg/dL — ABNORMAL LOW (ref 8.9–10.3)
Chloride: 101 mmol/L (ref 101–111)
GFR calc non Af Amer: >60 mL/min (ref >60)
Glucose, Bld: 83 mg/dL (ref 65–99)
POTASSIUM: 3.9 mmol/L (ref 3.5–5.1)
SODIUM: 137 mmol/L (ref 135–145)

## 2017-01-24 MED ORDER — CHLORHEXIDINE GLUCONATE CLOTH 2 % EX PADS: 6.0000 | MEDICATED_PAD | Freq: Every day | CUTANEOUS | Status: DC

## 2017-01-24 MED ORDER — MUPIROCIN 2 % EX OINT
1.0000 "application " | TOPICAL_OINTMENT | Freq: Two times a day (BID) | CUTANEOUS | Status: DC
  Administered 2017-01-24 – 2017-01-25 (×3): 1 via NASAL
  Filled 2017-01-24: qty 22

## 2017-01-24 NOTE — Significant Event (Signed)
Per CSW pt needs another midnight stay to qualify for SNF. She was observation on her first night stay in the hospital. D/C in AM to SNF  Clint LippsMutaz A Rayanna Matusik, MD Pager: [phone] 01/24/2017, 2:59 PM

## 2017-01-24 NOTE — Consult Note (Signed)
           [facility] CM Primary Care Navigator  01/24/2017  Toney Reilnne S Logiudice 1918/09/15 [id number]   Went to see patient at the bedside to identify possible discharge needs. Patient noted to be very hard of hearing. Patient reports she had a fall while cleaning up at home that had led to this admission/ surgery.  She endorsed [facility] Shirlean [facility]ylararol Webb [facility] her primary care [facility]. Patient shared using [facility] pharmacy in [place] to obtain medications without difficulty. She mentioned managing her own medications at home (not taking much).  Patient lives alone and son Merlyn Albert([name]) provides transportation to her doctors' appointments and assists with her needs at home per patient.  Discharge plan is skilled nursing facility placement (still in process) due to her physical limitations per therapy recommendation with the hope of returning back to home. Inpatient social worker is currently assisting patient with placement.  Patient voiced understanding to call primary care provider's office when she returns back home, for a post discharge follow-up appointment within a week or sooner if needs arise. Patient letter (with PCP's contact number) was provided as her and son's reminder.  Patient communicated no further needs or concerns at this time.   For questions, please contact:  Wyatt HasteLorraine Worthington Cruzan, BSN, RN- [facility] Primary Care Navigator  Telephone: [phone] [facility]

## 2017-01-24 NOTE — Progress Notes (Signed)
   PATIENT ID: Toney ReilAnne S Golomb   2 Days Post-Op Procedure(s) (LRB): CANNULATED HIP PINNING (Left)  Subjective: Reports no pain, comfortable. Walking up the hallways.  Objective:  Vitals:   01/23/17 1500 01/23/17 2034  BP: (!) 144/87 122/64  Pulse: 94 94  Resp: 18 18  Temp: 98.1 F (36.7 C) 97.8 F (36.6 C)     L hip dressing c/d/I Wiggles toes   Labs:   Recent Labs  01/21/17 2303 01/23/17 0457 01/24/17 0607  HGB 13.9 10.6* 10.4*   Recent Labs  01/23/17 0457 01/24/17 0607  WBC 8.3 9.1  RBC 3.52* 3.48*  HCT 33.2* 32.2*  PLT 173 191   Recent Labs  01/23/17 0457 01/24/17 0607  NA 138 137  K 3.9 3.9  CL 105 101  CO2 25 28  BUN 11 14  CREATININE 0.67 0.69  GLUCOSE 87 83  CALCIUM 8.3* 8.6*    Assessment and Plan: 2 days  s/p left hip pinning WBAT ASA 325mg  BID Minimize pain medicine, tramadol only as needed, rec tylenol if tolerated Continue with PT, d/c to SNF when cleared by medicine Fu with Dr. Ave Filterhandler in 2 weeks Appreciate medicine following  VTE proph: ASA, SCDs

## 2017-01-24 NOTE — Clinical Social Work Note (Addendum)
Patient is discharged but has not been under inpatient status for three days. Third day will be tomorrow. RNCM and MD aware. Patient is 83 years old and lives home alone. Patient in for a femur fracture.   Jamie CourtSarah Ulric Salzman, CSW [phone]

## 2017-01-24 NOTE — Discharge Summary (Signed)
Physician Discharge Summary  Jamie Carlson UEA:[id number] DOB: 1917-11-17 DOA: 01/21/2017  PCP: No primary care provider on file.  Admit date: 01/21/2017 Discharge date: 01/24/2017  Admitted From: Home Disposition: Home  Recommendations for Outpatient Follow-up:  1. Follow up with PCP in 1-2 weeks 2. Please obtain BMP/CBC in one week  Home Health: NA Equipment/Devices:NA  Discharge Condition: Stable CODE STATUS: DNR Diet recommendation: DIET DYS 3 Room service appropriate? Yes; Fluid consistency: Thin Diet - low sodium heart healthy  Brief/Interim Summary: Jamie Carlson is a 83 y.o. female with little known past medical history who does not take any medications and presents to the emergency department with severe left hip pain following an unwitnessed fall. Patient reports that she was in her usual state of health and having an uneventful day before tripping over something and falling onto her left side. She reports severe pain at the left thigh immediately after the fall, worse with any attempted movement of the leg. She denies striking her head and is not on any anticoagulant. She denies frequent falls. She denies recent fevers or chills, denies chest pain or palpitations, and denies cough or dyspnea. There is been no recent abdominal pain, nausea, vomiting, or diarrhea. She also denies melena or hematochezia. Surgical history includes a remote cesarean section which she reportedly tolerated without incident.  Discharge Diagnoses:  Principal Problem:   Closed subcapital fracture of femur, left, initial encounter [facility]) Active Problems:   Essential hypertension   Hypertensive urgency   Hypokalemia   Cardiac murmur   Protein-calorie malnutrition, severe   Closed left subcapital femur fracture  -Presents with severe left thigh pain after a mechanical fall, found to have subcapital fracture. -She has a loud systolic murmur at upper sternal border concerning for severe valvular  disease. -2-D echo was done and showed normal LVEF with mild AS. -Underwent cannulated hip pinning done by Dr. Ave Filter on 01/22/2017. -PT recommended SNF, discharged today with aspirin for DVT prophylaxis (per orthopedic recommendation). -Tramadol for pain control.  Cardiac murmur -As above, pt has loud murmur not previously evaluated and will undergo TTE prior to surgery  -2-D echo was done and showed normal LVEF with mild AS.  Hypertensive urgency  - BP in 190/110 range on admission  - Pain is likely contributing; pt not on antihypertensives at home - This is resolved, back on her home medications.  Hypokalemia  - Serum potassium is 3.3 on admission. - This is resolved, repleted with oral and parenteral supplements. Potassium 3.9 this morning.  ?UTI  - Urine sent for culture and pt treated with a dose of Rocephin in ED  - UA not convincing for infxn, no bacteria seen, and pt denies dysuria  - Follow culture  Acute blood loss anemia -Hemoglobin dropped from 13.9 on admission down to 10.6. -This is likely secondary to perioperative blood loss plus hemodilution from IV fluids. Check CBC in a.m.   Discharge Instructions  Discharge Instructions    Diet - low sodium heart healthy    Complete by:  As directed    Increase activity slowly    Complete by:  As directed    Weight bearing as tolerated    Complete by:  As directed      Allergies as of 01/24/2017      Reactions   Sulfa Antibiotics    unknown   Bee Venom Rash      Medication List    TAKE these medications   aspirin EC 325 MG tablet  Take 1 tablet (325 mg total) by mouth 2 (two) times daily.   traMADol 50 MG tablet Commonly known as:  ULTRAM Take 1 tablet (50 mg total) by mouth every 6 (six) hours as needed for moderate pain.            Durable Medical Equipment        Start     Ordered   01/24/17 1102  For home use only DME Bedside commode  Once    Question:  Patient needs a bedside commode  to treat with the following condition  Answer:  S/p left hip fracture   01/24/17 1101     Follow-up Information    Mable ParisHANDLER,JUSTIN WILLIAM, MD Follow up in 2 week(s).   Specialty:  Orthopedic Surgery Contact information: [address] [place] [id number] [phone]          Allergies  Allergen Reactions  . Sulfa Antibiotics     unknown  . Bee Venom Rash    Consultations:  Treatment Team:   Jones BroomJustin Chandler, MD  Procedures (Echo, Carotid, EGD, Colonoscopy, ERCP)   Radiological studies: Dg Chest 2 View  Result Date: 01/21/2017 CLINICAL DATA:  LEFT femoral neck fracture EXAM: CHEST  2 VIEW COMPARISON:  None. FINDINGS: Normal cardiac silhouette. Lungs are hyperinflated. No effusion, infiltrate pneumothorax. IMPRESSION: Hyperinflated lungs without acute findings. Electronically Signed   By: Genevive BiStewart  Edmunds M.D.   On: 01/21/2017 21:56   Dg Knee 2 Views Left  Result Date: 01/21/2017 CLINICAL DATA:  Fall-left leg injury. Pt is presented from home by medics who report that that pt had an unwitnessed fall that she, pt who is AOx4 but hard of hearing, reports she "tripped and fell." She exhibiting some degree of discomfort EXAM: LEFT KNEE - 1-2 VIEW COMPARISON:  None. FINDINGS: No fracture of the proximal tibia or distal femur. Patella is normal. No joint effusion. IMPRESSION: No fracture or dislocation. Electronically Signed   By: Genevive BiStewart  Edmunds M.D.   On: 01/21/2017 21:53   Pelvis Portable  Result Date: 01/22/2017 CLINICAL DATA:  Status post left femoral neck fracture fixation. EXAM: PORTABLE PELVIS 1-2 VIEWS COMPARISON:  None. FINDINGS: There has been an a fixation of left sub capitellar femoral neck fracture with 3 cancellous screws placement. The left hip is located. Fracture line is seen through the left femoral neck. No other fractures are seen. Mild osteopenia. IMPRESSION: Left sub capitellar femoral neck fracture fixation without evidence of immediate  complications. Electronically Signed   By: Ted Mcalpineobrinka  Dimitrova M.D.   On: 01/22/2017 17:11   Dg C-arm 1-60 Min  Result Date: 01/22/2017 CLINICAL DATA:  Left hip fracture repair. EXAM: DG C-ARM 61-120 MIN; OPERATIVE LEFT HIP WITH PELVIS FLUOROSCOPY TIME:  51 seconds Images: 2 COMPARISON:  None. FINDINGS: The intraoperative images demonstrate the placement of 3 screws across the left hip fracture. The postoperative film demonstrates 3 screws across the nondisplaced left hip fracture. IMPRESSION: Left hip fracture repair as above. Electronically Signed   By: Gerome Samavid  Williams III M.D   On: 01/22/2017 18:10   Dg Hip Operative Unilat W Or W/o Pelvis Left  Result Date: 01/22/2017 CLINICAL DATA:  Left hip fracture repair. EXAM: DG C-ARM 61-120 MIN; OPERATIVE LEFT HIP WITH PELVIS FLUOROSCOPY TIME:  51 seconds Images: 2 COMPARISON:  None. FINDINGS: The intraoperative images demonstrate the placement of 3 screws across the left hip fracture. The postoperative film demonstrates 3 screws across the nondisplaced left hip fracture. IMPRESSION: Left hip fracture repair as  above. Electronically Signed   By: Gerome Sam III M.D   On: 01/22/2017 18:10   Dg Hip Unilat W Or Wo Pelvis 2-3 Views Left  Result Date: 01/21/2017 CLINICAL DATA:  Fall, discomfort. EXAM: DG HIP (WITH OR WITHOUT PELVIS) 2-3V LEFT COMPARISON:  None. FINDINGS: There is a fracture of the LEFT femoral neck with shortening. Fracture is in a subcapital location. Hip joint remains intact. IMPRESSION: Subcapital fracture of the LEFT femoral neck with shortening. Electronically Signed   By: Genevive Bi M.D.   On: 01/21/2017 21:55   Dg Femur Min 2 Views Left  Result Date: 01/21/2017 CLINICAL DATA:  Fall-left leg injury. Pt is presented from home by medics who report that that pt had an unwitnessed fall that she, pt who is AOx4 but hard of hearing, reports she "tripped and fell." She exhibiting some degree of discomfort on h.*comment was truncated*  EXAM: LEFT FEMUR 2 VIEWS COMPARISON:  None. FINDINGS: Limited view of the distal LEFT femur demonstrates no fracture. Proximal femur not imaged. IMPRESSION: No fracture distal femur. Electronically Signed   By: Genevive Bi M.D.   On: 01/21/2017 21:53     Subjective:  Discharge Exam: Vitals:   01/23/17 0608 01/23/17 1156 01/23/17 1500 01/23/17 2034  BP: 132/72  (!) 144/87 122/64  Pulse: 74  94 94  Resp: 18  18 18   Temp: 98.2 F (36.8 C)  98.1 F (36.7 C) 97.8 F (36.6 C)  TempSrc: Oral  Oral Oral  SpO2: 95%  91% 91%  Weight:  32.1 kg (70 lb 12.8 oz)    Height:  4\' 11"  (1.499 m)     General: Pt is alert, awake, not in acute distress Cardiovascular: RRR, S1/S2 +, no rubs, no gallops Respiratory: CTA bilaterally, no wheezing, no rhonchi Abdominal: Soft, NT, ND, bowel sounds + Extremities: no edema, no cyanosis   The results of significant diagnostics from this hospitalization (including imaging, microbiology, ancillary and laboratory) are listed below for reference.    Microbiology: Recent Results (from the past 240 hour(s))  Urine culture     Status: Abnormal   Collection Time: 01/21/17 10:36 PM  Result Value Ref Range Status   Specimen Description URINE, CLEAN CATCH  Final   Special Requests NONE  Final   Culture (A)  Final    <10,000 COLONIES/mL INSIGNIFICANT GROWTH Performed at [facility], [address] [place], [place] [address]    Report Status 01/23/2017 FINAL  Final  Surgical pcr screen     Status: Abnormal   Collection Time: 01/22/17  5:24 AM  Result Value Ref Range Status   MRSA, PCR POSITIVE (A) NEGATIVE Final   Staphylococcus aureus POSITIVE (A) NEGATIVE Final    Comment:        The Xpert SA Assay (FDA approved for NASAL specimens in patients over 87 years of age), is one component of a comprehensive surveillance program.  Test performance has been validated by [facility] for patients greater than or equal to 66 year old. It is not  intended to diagnose infection nor to guide or monitor treatment. RESULT CALLED TO, READ BACK BY AND VERIFIED WITH: [facility] AT 0715 01/22/17 BY L BENFIELD      Labs: BNP (last 3 results) No results for input(s): BNP in the last 8760 hours. Basic Metabolic Panel:  Recent Labs Lab 01/21/17 2303 01/23/17 0457 01/24/17 0607  NA 139 138 137  K 3.3* 3.9 3.9  CL 104 105 101  CO2  25 25 28   GLUCOSE 112* 87 83  BUN 14 11 14   CREATININE 0.70 0.67 0.69  CALCIUM 9.5 8.3* 8.6*   Liver Function Tests:  Recent Labs Lab 01/21/17 2303  AST 24  ALT 21  ALKPHOS 83  BILITOT 0.8  PROT 7.4  ALBUMIN 4.3   No results for input(s): LIPASE, AMYLASE in the last 168 hours. No results for input(s): AMMONIA in the last 168 hours. CBC:  Recent Labs Lab 01/21/17 2303 01/23/17 0457 01/24/17 0607  WBC 12.5* 8.3 9.1  NEUTROABS 10.7*  --   --   HGB 13.9 10.6* 10.4*  HCT 40.8 33.2* 32.2*  MCV 89.7 94.3 92.5  PLT 259 173 191   Cardiac Enzymes: No results for input(s): CKTOTAL, CKMB, CKMBINDEX, TROPONINI in the last 168 hours. BNP: Invalid input(s): POCBNP CBG: No results for input(s): GLUCAP in the last 168 hours. D-Dimer No results for input(s): DDIMER in the last 72 hours. Hgb A1c No results for input(s): HGBA1C in the last 72 hours. Lipid Profile No results for input(s): CHOL, HDL, LDLCALC, TRIG, CHOLHDL, LDLDIRECT in the last 72 hours. Thyroid function studies No results for input(s): TSH, T4TOTAL, T3FREE, THYROIDAB in the last 72 hours.  Invalid input(s): FREET3 Anemia work up No results for input(s): VITAMINB12, FOLATE, FERRITIN, TIBC, IRON, RETICCTPCT in the last 72 hours. Urinalysis    Component Value Date/Time   COLORURINE STRAW (A) 01/21/2017 2236   APPEARANCEUR CLEAR 01/21/2017 2236   LABSPEC 1.004 (L) 01/21/2017 2236   PHURINE 6.0 01/21/2017 2236   GLUCOSEU NEGATIVE 01/21/2017 2236   HGBUR MODERATE (A) 01/21/2017 2236   BILIRUBINUR NEGATIVE 01/21/2017 2236    KETONESUR NEGATIVE 01/21/2017 2236   PROTEINUR NEGATIVE 01/21/2017 2236   UROBILINOGEN 1.0 06/22/2013 1458   NITRITE NEGATIVE 01/21/2017 2236   LEUKOCYTESUR SMALL (A) 01/21/2017 2236   Sepsis Labs Invalid input(s): PROCALCITONIN,  WBC,  LACTICIDVEN Microbiology Recent Results (from the past 240 hour(s))  Urine culture     Status: Abnormal   Collection Time: 01/21/17 10:36 PM  Result Value Ref Range Status   Specimen Description URINE, CLEAN CATCH  Final   Special Requests NONE  Final   Culture (A)  Final    <10,000 COLONIES/mL INSIGNIFICANT GROWTH Performed at [facility], [address] [place], [place] [address]    Report Status 01/23/2017 FINAL  Final  Surgical pcr screen     Status: Abnormal   Collection Time: 01/22/17  5:24 AM  Result Value Ref Range Status   MRSA, PCR POSITIVE (A) NEGATIVE Final   Staphylococcus aureus POSITIVE (A) NEGATIVE Final    Comment:        The Xpert SA Assay (FDA approved for NASAL specimens in patients over 71 years of age), is one component of a comprehensive surveillance program.  Test performance has been validated by [facility] for patients greater than or equal to 62 year old. It is not intended to diagnose infection nor to guide or monitor treatment. RESULT CALLED TO, READ BACK BY AND VERIFIED WITH: F [facility] AT 0715 01/22/17 BY L BENFIELD      Time coordinating discharge: Over 30 minutes  SIGNED:   Clint Lipps, MD  [facility] 01/24/2017, 11:56 AM Pager   If 7PM-7AM, please contact night-coverage [email or web address] Password [email or web address]

## 2017-01-24 NOTE — Plan of Care (Signed)
Problem: Safety: Goal: Ability to remain free from injury will improve Outcome: Progressing Safety precautions and fall preventions maintained  Problem: Pain Managment: Goal: General experience of comfort will improve Outcome: Progressing Medicated for pain once this shift, resting in the bed with eyes closed, no acute distress noted  Problem: Activity: Goal: Risk for activity intolerance will decrease Outcome: Progressing OOB with assistance, tolerated well  Problem: Nutrition: Goal: Adequate nutrition will be maintained Outcome: Not Progressing Poor PO intake, refused Ensure tonight  Problem: Bowel/Gastric: Goal: Will not experience complications related to bowel motility Outcome: Progressing No bowel and gastric issues noted, last BM 01/05/2017

## 2017-01-25 DIAGNOSIS — S728X9A Other fracture of unspecified femur, initial encounter for closed fracture: Secondary | ICD-10-CM | POA: Diagnosis not present

## 2017-01-25 DIAGNOSIS — D62 Acute posthemorrhagic anemia: Secondary | ICD-10-CM | POA: Diagnosis not present

## 2017-01-25 DIAGNOSIS — R011 Cardiac murmur, unspecified: Secondary | ICD-10-CM | POA: Diagnosis not present

## 2017-01-25 DIAGNOSIS — E440 Moderate protein-calorie malnutrition: Secondary | ICD-10-CM | POA: Diagnosis not present

## 2017-01-25 DIAGNOSIS — Z4789 Encounter for other orthopedic aftercare: Secondary | ICD-10-CM | POA: Diagnosis not present

## 2017-01-25 DIAGNOSIS — E876 Hypokalemia: Secondary | ICD-10-CM | POA: Diagnosis not present

## 2017-01-25 DIAGNOSIS — R2689 Other abnormalities of gait and mobility: Secondary | ICD-10-CM | POA: Diagnosis not present

## 2017-01-25 DIAGNOSIS — E43 Unspecified severe protein-calorie malnutrition: Secondary | ICD-10-CM | POA: Diagnosis not present

## 2017-01-25 DIAGNOSIS — D649 Anemia, unspecified: Secondary | ICD-10-CM | POA: Diagnosis not present

## 2017-01-25 DIAGNOSIS — K5901 Slow transit constipation: Secondary | ICD-10-CM | POA: Diagnosis not present

## 2017-01-25 DIAGNOSIS — S72012A Unspecified intracapsular fracture of left femur, initial encounter for closed fracture: Secondary | ICD-10-CM | POA: Diagnosis not present

## 2017-01-25 DIAGNOSIS — S72012S Unspecified intracapsular fracture of left femur, sequela: Secondary | ICD-10-CM | POA: Diagnosis not present

## 2017-01-25 DIAGNOSIS — R296 Repeated falls: Secondary | ICD-10-CM | POA: Diagnosis not present

## 2017-01-25 DIAGNOSIS — M25552 Pain in left hip: Secondary | ICD-10-CM | POA: Diagnosis not present

## 2017-01-25 DIAGNOSIS — M25562 Pain in left knee: Secondary | ICD-10-CM | POA: Diagnosis not present

## 2017-01-25 DIAGNOSIS — S72002A Fracture of unspecified part of neck of left femur, initial encounter for closed fracture: Secondary | ICD-10-CM | POA: Diagnosis not present

## 2017-01-25 DIAGNOSIS — I10 Essential (primary) hypertension: Secondary | ICD-10-CM | POA: Diagnosis not present

## 2017-01-25 DIAGNOSIS — G8911 Acute pain due to trauma: Secondary | ICD-10-CM | POA: Diagnosis not present

## 2017-01-25 DIAGNOSIS — I160 Hypertensive urgency: Secondary | ICD-10-CM | POA: Diagnosis not present

## 2017-01-25 DIAGNOSIS — R2681 Unsteadiness on feet: Secondary | ICD-10-CM | POA: Diagnosis not present

## 2017-01-25 DIAGNOSIS — R278 Other lack of coordination: Secondary | ICD-10-CM | POA: Diagnosis not present

## 2017-01-25 DIAGNOSIS — N3000 Acute cystitis without hematuria: Secondary | ICD-10-CM | POA: Diagnosis not present

## 2017-01-25 DIAGNOSIS — Z5189 Encounter for other specified aftercare: Secondary | ICD-10-CM | POA: Diagnosis not present

## 2017-01-25 LAB — CBC
HCT: 32.5 % — ABNORMAL LOW (ref 36.0–46.0)
HEMOGLOBIN: 10.5 g/dL — AB (ref 12.0–15.0)
MCH: 29.7 pg (ref 26.0–34.0)
MCHC: 32.3 g/dL (ref 30.0–36.0)
MCV: 91.8 fL (ref 78.0–100.0)
Platelets: 187 10*3/uL (ref 150–400)
RBC: 3.54 MIL/uL — AB (ref 3.87–5.11)
RDW: 13.4 % (ref 11.5–15.5)
WBC: 7.1 10*3/uL (ref 4.0–10.5)

## 2017-01-25 MED ORDER — POLYETHYLENE GLYCOL 3350 17 G PO PACK: 17.0000 g | PACK | Freq: Every day | ORAL | 0 refills | Status: DC | PRN

## 2017-01-25 NOTE — Plan of Care (Signed)
Problem: Safety: Goal: Ability to remain free from injury will improve Outcome: Progressing Safety precautions maintained  Problem: Pain Managment: Goal: General experience of comfort will improve Outcome: Progressing Medicated once for pain with full relief  Problem: Tissue Perfusion: Goal: Risk factors for ineffective tissue perfusion will decrease Outcome: Progressing SCDs are on patient is on ASA, no S/S of DVT noted  Problem: Activity: Goal: Risk for activity intolerance will decrease Outcome: Progressing Ambulates to BR and hall with minimum assistance, tolerates well  Problem: Nutrition: Goal: Adequate nutrition will be maintained Outcome: Not Progressing Poor nutrition intake, refusing supplements

## 2017-01-25 NOTE — Discharge Summary (Signed)
Physician Discharge Summary  Jamie Carlson:[id number] DOB: [birth year] DOA: 01/21/2017  Admit date: 01/21/2017 Discharge date: 01/25/2017  Admitted From: Home Disposition: Home  Recommendations for Outpatient Follow-up:  1. Follow up with PCP in 1-2 weeks 2. Please obtain BMP/CBC in one week   Discharge Condition: Stable CODE STATUS: DNR Diet recommendation: DIET DYS 3 Room service appropriate? Yes; Fluid consistency: Thin Diet - low sodium heart healthy  Brief/Interim Summary: Jamie Carlson is a 83 y.o. female with little known past medical history who does not take any medications and presented to the emergency department with severe left hip pain following an unwitnessed fall. This was a mechanical fall. Patient was found to have hip fracture. She was hospitalized for further management.   Discharge Diagnoses:  Principal Problem:   Closed subcapital fracture of femur, left, initial encounter [facility]) Active Problems:   Essential hypertension   Hypertensive urgency   Hypokalemia   Cardiac murmur   Protein-calorie malnutrition, severe   Closed left subcapital femur fracture  -Presented with severe left thigh pain after a mechanical fall, found to have subcapital fracture. -Underwent cannulated hip pinning done by Dr. Ave Filter on 01/22/2017. -PT recommended SNF, discharged with aspirin for DVT prophylaxis (per orthopedic recommendation). -Tramadol for pain control.  Cardiac murmur -Patient was noted to have loud murmur not previously evaluated and underwent echocardiogram prior to surgery  -2-D echo was done and showed normal LVEF with mild AS.  Hypertensive urgency  - BP in 190/110 range on admission  - Pain was likely contributing; pt not on antihypertensives at home - This is resolved.  Hypokalemia  - Serum potassium was 3.3 on admission. - This is resolved, repleted with oral and parenteral supplements.  ?UTI  - Urine sent for culture and pt treated with a  dose of Rocephin in ED  - UA not convincing for infxn, no bacteria seen, and pt denies dysuria  - Insignificant growth on urine culture  Acute blood loss anemia -Hemoglobin dropped from 13.9 on admission down to 10.6. -This is likely secondary to perioperative blood loss plus hemodilution from IV fluids. Hemoglobin has been stable.  Overall, stable. Okay for discharge to skilled nursing facility today.   Discharge Instructions  Discharge Instructions    Diet - low sodium heart healthy    Complete by:  As directed    Increase activity slowly    Complete by:  As directed    Weight bearing as tolerated    Complete by:  As directed      Allergies as of 01/25/2017      Reactions   Sulfa Antibiotics    unknown   Bee Venom Rash      Medication List    TAKE these medications   aspirin EC 325 MG tablet Take 1 tablet (325 mg total) by mouth 2 (two) times daily.   polyethylene glycol packet Commonly known as:  MIRALAX / GLYCOLAX Take 17 g by mouth daily as needed for mild constipation.   traMADol 50 MG tablet Commonly known as:  ULTRAM Take 1 tablet (50 mg total) by mouth every 6 (six) hours as needed for moderate pain.            Durable Medical Equipment        Start     Ordered   01/24/17 1102  For home use only DME Bedside commode  Once    Question:  Patient needs a bedside commode to treat with the following condition  Answer:  S/p left hip fracture   01/24/17 1101      Contact information for follow-up providers    Mable Paris, MD Follow up in 2 week(s).   Specialty:  Orthopedic Surgery Contact information: [address] [place] [address] [phone]            Contact information for after-discharge care    Destination    [facility] .   Specialty:  Skilled Nursing Facility Contact information: [address] [place] [address] [phone]                 Allergies  Allergen  Reactions  . Sulfa Antibiotics     unknown  . Bee Venom Rash    Consultations: Treatment Team:  Jones Broom, MD  Procedures Transthoracic echocardiogram Study Conclusions  - Left ventricle: The cavity size was normal. Wall thickness was   normal. Systolic function was normal. The estimated ejection   fraction was in the range of 60% to 65%. Wall motion was normal;   there were no regional wall motion abnormalities. Doppler   parameters are consistent with abnormal left ventricular   relaxation (grade 1 diastolic dysfunction). - Aortic valve: Moderately calcified annulus. Severely thickened,   severely calcified leaflets. There was mild stenosis. Valve area   (VTI): 1.16 cm^2. Valve area (Vmax): 1.03 cm^2. Valve area   (Vmean): 1.01 cm^2. - Mitral valve: Mildly to moderately calcified annulus. Valve area   by continuity equation (using LVOT flow): 1.27 cm^2. - Tricuspid valve: There was mild-moderate regurgitation. - Pulmonary arteries: Systolic pressure was moderately increased.   PA peak pressure: 62 mm Hg (S). Impressions: - Normal LV systlic function. Greade 1 diastolic dysfunction   Mild AS   Moderate pulmonary HTN  Radiological studies: Dg Chest 2 View  Result Date: 01/21/2017 CLINICAL DATA:  LEFT femoral neck fracture EXAM: CHEST  2 VIEW COMPARISON:  None. FINDINGS: Normal cardiac silhouette. Lungs are hyperinflated. No effusion, infiltrate pneumothorax. IMPRESSION: Hyperinflated lungs without acute findings. Electronically Signed   By: Genevive Bi M.D.   On: 01/21/2017 21:56   Dg Knee 2 Views Left  Result Date: 01/21/2017 CLINICAL DATA:  Fall-left leg injury. Pt is presented from home by medics who report that that pt had an unwitnessed fall that she, pt who is AOx4 but hard of hearing, reports she "tripped and fell." She exhibiting some degree of discomfort EXAM: LEFT KNEE - 1-2 VIEW COMPARISON:  None. FINDINGS: No fracture of the proximal tibia or distal  femur. Patella is normal. No joint effusion. IMPRESSION: No fracture or dislocation. Electronically Signed   By: Genevive Bi M.D.   On: 01/21/2017 21:53   Pelvis Portable  Result Date: 01/22/2017 CLINICAL DATA:  Status post left femoral neck fracture fixation. EXAM: PORTABLE PELVIS 1-2 VIEWS COMPARISON:  None. FINDINGS: There has been an a fixation of left sub capitellar femoral neck fracture with 3 cancellous screws placement. The left hip is located. Fracture line is seen through the left femoral neck. No other fractures are seen. Mild osteopenia. IMPRESSION: Left sub capitellar femoral neck fracture fixation without evidence of immediate complications. Electronically Signed   By: Ted Mcalpine M.D.   On: 01/22/2017 17:11   Dg C-arm 1-60 Min  Result Date: 01/22/2017 CLINICAL DATA:  Left hip fracture repair. EXAM: DG C-ARM 61-120 MIN; OPERATIVE LEFT HIP WITH PELVIS FLUOROSCOPY TIME:  51 seconds Images: 2 COMPARISON:  None. FINDINGS: The intraoperative images demonstrate the placement of 3 screws across  the left hip fracture. The postoperative film demonstrates 3 screws across the nondisplaced left hip fracture. IMPRESSION: Left hip fracture repair as above. Electronically Signed   By: Gerome Samavid  Williams III M.D   On: 01/22/2017 18:10   Dg Hip Operative Unilat W Or W/o Pelvis Left  Result Date: 01/22/2017 CLINICAL DATA:  Left hip fracture repair. EXAM: DG C-ARM 61-120 MIN; OPERATIVE LEFT HIP WITH PELVIS FLUOROSCOPY TIME:  51 seconds Images: 2 COMPARISON:  None. FINDINGS: The intraoperative images demonstrate the placement of 3 screws across the left hip fracture. The postoperative film demonstrates 3 screws across the nondisplaced left hip fracture. IMPRESSION: Left hip fracture repair as above. Electronically Signed   By: Gerome Samavid  Williams III M.D   On: 01/22/2017 18:10   Dg Hip Unilat W Or Wo Pelvis 2-3 Views Left  Result Date: 01/21/2017 CLINICAL DATA:  Fall, discomfort. EXAM: DG HIP (WITH  OR WITHOUT PELVIS) 2-3V LEFT COMPARISON:  None. FINDINGS: There is a fracture of the LEFT femoral neck with shortening. Fracture is in a subcapital location. Hip joint remains intact. IMPRESSION: Subcapital fracture of the LEFT femoral neck with shortening. Electronically Signed   By: Genevive BiStewart  Edmunds M.D.   On: 01/21/2017 21:55   Dg Femur Min 2 Views Left  Result Date: 01/21/2017 CLINICAL DATA:  Fall-left leg injury. Pt is presented from home by medics who report that that pt had an unwitnessed fall that she, pt who is AOx4 but hard of hearing, reports she "tripped and fell." She exhibiting some degree of discomfort on h.*comment was truncated* EXAM: LEFT FEMUR 2 VIEWS COMPARISON:  None. FINDINGS: Limited view of the distal LEFT femur demonstrates no fracture. Proximal femur not imaged. IMPRESSION: No fracture distal femur. Electronically Signed   By: Genevive BiStewart  Edmunds M.D.   On: 01/21/2017 21:53    Subjective:  Discharge Exam: Vitals:   01/23/17 2034 01/24/17 1900 01/24/17 2112 01/25/17 0456  BP: 122/64 137/74 128/62 122/68  Pulse: 94 77 97 95  Resp: 18 18 18 18   Temp: 97.8 F (36.6 C) 98.2 F (36.8 C) 98 F (36.7 C) 98 F (36.7 C)  TempSrc: Oral Oral Oral Oral  SpO2: 91% (!) 85% 91% 91%  Weight:      Height:       General: Pt is alert, awake, not in acute distress Cardiovascular: RRR, S1/S2 +, no rubs, no gallops Respiratory: CTA bilaterally, no wheezing, no rhonchi Abdominal: Soft, NT, ND, bowel sounds + Extremities: no edema, no cyanosis   The results of significant diagnostics from this hospitalization (including imaging, microbiology, ancillary and laboratory) are listed below for reference.    Microbiology: Recent Results (from the past 240 hour(s))  Urine culture     Status: Abnormal   Collection Time: 01/21/17 10:36 PM  Result Value Ref Range Status   Specimen Description URINE, CLEAN CATCH  Final   Special Requests NONE  Final   Culture (A)  Final    <10,000  COLONIES/mL INSIGNIFICANT GROWTH Performed at [facility], [address] [place], [place] [address]    Report Status 01/23/2017 FINAL  Final  Surgical pcr screen     Status: Abnormal   Collection Time: 01/22/17  5:24 AM  Result Value Ref Range Status   MRSA, PCR POSITIVE (A) NEGATIVE Final   Staphylococcus aureus POSITIVE (A) NEGATIVE Final    Comment:        The Xpert SA Assay (FDA approved for NASAL specimens in patients over 421 years of age),  is one component of a comprehensive surveillance program.  Test performance has been validated by [facility] for patients greater than or equal to 13 year old. It is not intended to diagnose infection nor to guide or monitor treatment. RESULT CALLED TO, READ BACK BY AND VERIFIED WITH: F [facility] AT 0715 01/22/17 BY L BENFIELD      Labs: Basic Metabolic Panel:  Recent Labs Lab 01/21/17 2303 01/23/17 0457 01/24/17 0607  NA 139 138 137  K 3.3* 3.9 3.9  CL 104 105 101  CO2 25 25 28   GLUCOSE 112* 87 83  BUN 14 11 14   CREATININE 0.70 0.67 0.69  CALCIUM 9.5 8.3* 8.6*   Liver Function Tests:  Recent Labs Lab 01/21/17 2303  AST 24  ALT 21  ALKPHOS 83  BILITOT 0.8  PROT 7.4  ALBUMIN 4.3   CBC:  Recent Labs Lab 01/21/17 2303 01/23/17 0457 01/24/17 0607 01/25/17 0533  WBC 12.5* 8.3 9.1 7.1  NEUTROABS 10.7*  --   --   --   HGB 13.9 10.6* 10.4* 10.5*  HCT 40.8 33.2* 32.2* 32.5*  MCV 89.7 94.3 92.5 91.8  PLT 259 173 191 187   Urinalysis    Component Value Date/Time   COLORURINE STRAW (A) 01/21/2017 2236   APPEARANCEUR CLEAR 01/21/2017 2236   LABSPEC 1.004 (L) 01/21/2017 2236   PHURINE 6.0 01/21/2017 2236   GLUCOSEU NEGATIVE 01/21/2017 2236   HGBUR MODERATE (A) 01/21/2017 2236   BILIRUBINUR NEGATIVE 01/21/2017 2236   KETONESUR NEGATIVE 01/21/2017 2236   PROTEINUR NEGATIVE 01/21/2017 2236   UROBILINOGEN 1.0 06/22/2013 1458   NITRITE NEGATIVE 01/21/2017 2236   LEUKOCYTESUR SMALL (A) 01/21/2017 2236    Microbiology Recent Results (from the past 240 hour(s))  Urine culture     Status: Abnormal   Collection Time: 01/21/17 10:36 PM  Result Value Ref Range Status   Specimen Description URINE, CLEAN CATCH  Final   Special Requests NONE  Final   Culture (A)  Final    <10,000 COLONIES/mL INSIGNIFICANT GROWTH Performed at [facility], [address], [place], [place] [address]    Report Status 01/23/2017 FINAL  Final  Surgical pcr screen     Status: Abnormal   Collection Time: 01/22/17  5:24 AM  Result Value Ref Range Status   MRSA, PCR POSITIVE (A) NEGATIVE Final   Staphylococcus aureus POSITIVE (A) NEGATIVE Final    Comment:        The Xpert SA Assay (FDA approved for NASAL specimens in patients over 80 years of age), is one component of a comprehensive surveillance program.  Test performance has been validated by [facility] for patients greater than or equal to 14 year old. It is not intended to diagnose infection nor to guide or monitor treatment. RESULT CALLED TO, READ BACK BY AND VERIFIED WITH: F [facility] AT 0715 01/22/17 BY L BENFIELD      Time coordinating discharge: 35 minutes   Clebert Wenger, MD  [facility] 01/25/2017, 11:28 AM Pager   If 7PM-7AM, please contact night-coverage [email or web address] Password [email or web address]

## 2017-01-25 NOTE — Clinical Social Work Note (Addendum)
Clinical Social Worker facilitated patient discharge including contacting patient family and facility to confirm patient discharge plans.  Clinical information faxed to facility and family agreeable with plan.  CSW arranged ambulance transport via [facility] (3:00) to [place].  RN to call [phone] for report prior to discharge.  Clinical Social Worker will sign off for now as social work intervention is no longer needed. Please consult us again if new need arises.  [address]ridget Carlson, [place] [phone]

## 2017-01-26 ENCOUNTER — Encounter: Payer: Self-pay | Admitting: Adult Health

## 2017-01-26 ENCOUNTER — Non-Acute Institutional Stay (SKILLED_NURSING_FACILITY): Payer: Medicare Other | Admitting: Adult Health

## 2017-01-26 DIAGNOSIS — R2681 Unsteadiness on feet: Secondary | ICD-10-CM

## 2017-01-26 DIAGNOSIS — E43 Unspecified severe protein-calorie malnutrition: Secondary | ICD-10-CM

## 2017-01-26 DIAGNOSIS — D62 Acute posthemorrhagic anemia: Secondary | ICD-10-CM | POA: Diagnosis not present

## 2017-01-26 DIAGNOSIS — S72012A Unspecified intracapsular fracture of left femur, initial encounter for closed fracture: Secondary | ICD-10-CM

## 2017-01-26 DIAGNOSIS — K5901 Slow transit constipation: Secondary | ICD-10-CM | POA: Diagnosis not present

## 2017-01-26 DIAGNOSIS — R011 Cardiac murmur, unspecified: Secondary | ICD-10-CM | POA: Diagnosis not present

## 2017-01-26 DIAGNOSIS — I160 Hypertensive urgency: Secondary | ICD-10-CM | POA: Diagnosis not present

## 2017-01-26 DIAGNOSIS — E876 Hypokalemia: Secondary | ICD-10-CM

## 2017-01-26 DIAGNOSIS — K5900 Constipation, unspecified: Secondary | ICD-10-CM | POA: Insufficient documentation

## 2017-01-26 NOTE — Progress Notes (Signed)
DATE:  01/26/2017   MRN:  [id number]  BIRTHDAY: 08-Feb-1918  Facility:  Nursing Home Location:  [facility]  Nursing Home Room Number: 804-B  LEVEL OF CARE:  SNF [phone])  Contact Information    Name Relation Home Work [place] Son [id number]  [phone]       Code Status History    Date Active Date Inactive Code Status Order ID Comments User Context   01/22/2017  3:39 PM 01/25/2017  6:13 PM DNR [id number]  Clydia Llano, MD Inpatient   01/22/2017 12:35 AM 01/22/2017  3:39 PM Full Code [id number]  Briscoe Deutscher, MD ED    Questions for Most Recent Historical Code Status (Order [id number])    Question Answer Comment   In the event of cardiac or respiratory ARREST Do not call a "code blue"    In the event of cardiac or respiratory ARREST Do not perform Intubation, CPR, defibrillation or ACLS    In the event of cardiac or respiratory ARREST Use medication by any route, position, wound care, and other measures to relive pain and suffering. May use oxygen, suction and manual treatment of airway obstruction as needed for comfort.         Advance Directive Documentation     Most Recent Value  Type of Advance Directive  Out of facility DNR (pink MOST or yellow form)  Pre-existing out of facility DNR order (yellow form or pink MOST form)  -  "MOST" Form in Place?  -       Chief Complaint  Patient presents with  . Hospitalization Follow-up    HISTORY OF PRESENT ILLNESS:  This is a 89-YO female seen for hospital follow-up.  She was admitted to [facility] on 01/25/2017 for short-term rehabilitation following an admission at [facility] for a cannulated hip pinning of the left hip, S/P mechanical fall with subsequent subcapital fracture of the left hip.  She does not take any medications prior to hospitalization.  She was seen in the room with her son @ bedside. She has been noted to be hard of hearing but alert and oriented X 3. She is not  complaining of any pain on her left hip. Son requested for her Tramadol to be discontinued and the ASA EC 325 mg BID to be changed to 81 mg.  Informed son that we will call orthopedic's office about concerns regarding ASA.     PAST MEDICAL HISTORY:  Past Medical History:  Diagnosis Date  . Cardiac murmur   . Closed subcapital fracture of femur, left, initial encounter (HCC)   . Hypertension   . Hypertensive urgency   . Hypokalemia   . Protein-calorie malnutrition, severe (HCC)   . Urinary tract infection      CURRENT MEDICATIONS: Reviewed  Patient's Medications  New Prescriptions   No medications on file  Previous Medications   ASPIRIN EC 325 MG TABLET    Take 1 tablet (325 mg total) by mouth 2 (two) times daily.   POLYETHYLENE GLYCOL (MIRALAX / GLYCOLAX) PACKET    Take 17 g by mouth daily as needed for mild constipation.   TRAMADOL (ULTRAM) 50 MG TABLET    Take 1 tablet (50 mg total) by mouth every 6 (six) hours as needed for moderate pain.  Modified Medications   No medications on file  Discontinued Medications   No medications on file     Allergies  Allergen Reactions  . Sulfa Antibiotics  unknown  . Bee Venom Rash     REVIEW OF SYSTEMS:  GENERAL: no change in appetite, no fatigue, no weight changes, no fever, chills or weakness EYES: Denies change in vision, dry eyes, eye pain, itching or discharge EARS: Denies change in hearing, ringing in ears, or earache NOSE: Denies nasal congestion or epistaxis MOUTH and THROAT: Denies oral discomfort, gingival pain or bleeding, pain from teeth or hoarseness   RESPIRATORY: no cough, SOB, DOE, wheezing, hemoptysis CARDIAC: no chest pain, edema or palpitations GI: no abdominal pain, diarrhea, heart burn, nausea or vomiting, +constipation GU: Denies dysuria, frequency, hematuria, incontinence, or discharge PSYCHIATRIC: Denies feeling of depression or anxiety. No report of hallucinations, insomnia, paranoia, or  agitation    PHYSICAL EXAMINATION  GENERAL APPEARANCE: Well nourished. In no acute distress. Normal body habitus SKIN:  Left thigh surgical incision has staples covered with dry dressing, no erythema HEAD: Normal in size and contour. No evidence of trauma EYES: Lids open and close normally. No blepharitis, entropion or ectropion. PERRL. Conjunctivae are clear and sclerae are white. Lenses are without opacity EARS: Pinnae are normal. Hard of hearing MOUTH and THROAT: Lips are without lesions. Oral mucosa is moist and without lesions. Tongue is normal in shape, size, and color and without lesions NECK: supple, trachea midline, no neck masses, no thyroid tenderness, no thyromegaly LYMPHATICS: no LAN in the neck, no supraclavicular LAN RESPIRATORY: breathing is even & unlabored, BS CTAB CARDIAC: RRR, + murmur,no extra heart sounds, no edema GI: abdomen soft, normal BS, no masses, no tenderness, no hepatomegaly, no splenomegaly EXTREMITIES:  Able to move 4 extremities PSYCHIATRIC: Alert and oriented X 3. Affect and behavior are appropriate    LABS/RADIOLOGY: Labs reviewed: Basic Metabolic Panel:  Recent Labs  16/10/96 2303 01/23/17 0457 01/24/17 0607  NA 139 138 137  K 3.3* 3.9 3.9  CL 104 105 101  CO2 GLUCOSE 112* 87 83  BUN CREATININE 0.70 0.67 0.69  CALCIUM 9.5 8.3* 8.6*   Liver Function Tests:  Recent Labs  01/21/17 2303  AST 24  ALT 21  ALKPHOS 83  BILITOT 0.8  PROT 7.4  ALBUMIN 4.3   CBC:  Recent Labs  01/21/17 2303 01/23/17 0457 01/24/17 0607 01/25/17 0533  WBC 12.5* 8.3 9.1 7.1  NEUTROABS 10.7*  --   --   --   HGB 13.9 10.6* 10.4* 10.5*  HCT 40.8 33.2* 32.2* 32.5*  MCV 89.7 94.3 92.5 91.8  PLT 259 173 191 187    Dg Chest 2 View  Result Date: 01/21/2017 CLINICAL DATA:  LEFT femoral neck fracture EXAM: CHEST  2 VIEW COMPARISON:  None. FINDINGS: Normal cardiac silhouette. Lungs are hyperinflated. No effusion, infiltrate  pneumothorax. IMPRESSION: Hyperinflated lungs without acute findings. Electronically Signed   By: Genevive Bi M.D.   On: 01/21/2017 21:56   Dg Knee 2 Views Left  Result Date: 01/21/2017 CLINICAL DATA:  Fall-left leg injury. Pt is presented from home by medics who report that that pt had an unwitnessed fall that she, pt who is AOx4 but hard of hearing, reports she "tripped and fell." She exhibiting some degree of discomfort EXAM: LEFT KNEE - 1-2 VIEW COMPARISON:  None. FINDINGS: No fracture of the proximal tibia or distal femur. Patella is normal. No joint effusion. IMPRESSION: No fracture or dislocation. Electronically Signed   By: Genevive Bi M.D.   On: 01/21/2017 21:53   Pelvis Portable  Result Date: 01/22/2017 CLINICAL DATA:  Status post left femoral neck fracture fixation. EXAM: PORTABLE PELVIS 1-2 VIEWS COMPARISON:  None. FINDINGS: There has been an a fixation of left sub capitellar femoral neck fracture with 3 cancellous screws placement. The left hip is located. Fracture line is seen through the left femoral neck. No other fractures are seen. Mild osteopenia. IMPRESSION: Left sub capitellar femoral neck fracture fixation without evidence of immediate complications. Electronically Signed   By: Ted Mcalpine M.D.   On: 01/22/2017 17:11   Dg C-arm 1-60 Min  Result Date: 01/22/2017 CLINICAL DATA:  Left hip fracture repair. EXAM: DG C-ARM 61-120 MIN; OPERATIVE LEFT HIP WITH PELVIS FLUOROSCOPY TIME:  51 seconds Images: 2 COMPARISON:  None. FINDINGS: The intraoperative images demonstrate the placement of 3 screws across the left hip fracture. The postoperative film demonstrates 3 screws across the nondisplaced left hip fracture. IMPRESSION: Left hip fracture repair as above. Electronically Signed   By: Gerome Sam III M.D   On: 01/22/2017 18:10   Dg Hip Operative Unilat W Or W/o Pelvis Left  Result Date: 01/22/2017 CLINICAL DATA:  Left hip fracture repair. EXAM: DG C-ARM 61-120  MIN; OPERATIVE LEFT HIP WITH PELVIS FLUOROSCOPY TIME:  51 seconds Images: 2 COMPARISON:  None. FINDINGS: The intraoperative images demonstrate the placement of 3 screws across the left hip fracture. The postoperative film demonstrates 3 screws across the nondisplaced left hip fracture. IMPRESSION: Left hip fracture repair as above. Electronically Signed   By: Gerome Sam III M.D   On: 01/22/2017 18:10   Dg Hip Unilat W Or Wo Pelvis 2-3 Views Left  Result Date: 01/21/2017 CLINICAL DATA:  Fall, discomfort. EXAM: DG HIP (WITH OR WITHOUT PELVIS) 2-3V LEFT COMPARISON:  None. FINDINGS: There is a fracture of the LEFT femoral neck with shortening. Fracture is in a subcapital location. Hip joint remains intact. IMPRESSION: Subcapital fracture of the LEFT femoral neck with shortening. Electronically Signed   By: Genevive Bi M.D.   On: 01/21/2017 21:55   Dg Femur Min 2 Views Left  Result Date: 01/21/2017 CLINICAL DATA:  Fall-left leg injury. Pt is presented from home by medics who report that that pt had an unwitnessed fall that she, pt who is AOx4 but hard of hearing, reports she "tripped and fell." She exhibiting some degree of discomfort on h.*comment was truncated* EXAM: LEFT FEMUR 2 VIEWS COMPARISON:  None. FINDINGS: Limited view of the distal LEFT femur demonstrates no fracture. Proximal femur not imaged. IMPRESSION: No fracture distal femur. Electronically Signed   By: Genevive Bi M.D.   On: 01/21/2017 21:53    ASSESSMENT/PLAN:  Unsteady gait - for rehabilitation, PT and OT, for therapeutic strengthening exercises; fall precautions  Closed left subcapital femur fracture - S/P cannulated hip pinning on 01/22/17, follow-up with Dr. Ave Filter, orthopedics, in 2 weeks; discontinue tramadol and start Tylenol 325 mg 2 tabs = 650 mg by mouth every 4 hours when necessary for pain; currently on aspirin EC 325 mg 1 tab by mouth twice a day for DVT prophylaxis however son is requesting if it can be  changed to 81 mg, will consult orthopedics  Cardiac murmur - 2-D echo Showed normal LVEF with mild AS; monitor for chest pains  Hypertensive urgency - not on any medications, BP/HR twice a day 1 week  Constipation - start Metamucil 1 scoop mixed with liquid by mouth every other day and MiraLAX when necessary  Acute blood loss anemia - check CBC on 01/30/17 Lab Results  Component Value Date  HGB 10.5 (L) 01/25/2017   Hypokalemia - repleted in the hospital; check BMP  Severe protein calorie malnutrition - RD has evaluated patient and started her on med pass 120 mL daily and Magic cup daily     Goals of care:  Short-term rehabilitation   Stephanye Finnicum C. Medina-Vargas - NP    [employer] [phone]

## 2017-01-27 ENCOUNTER — Non-Acute Institutional Stay (SKILLED_NURSING_FACILITY): Payer: Medicare Other | Admitting: Internal Medicine

## 2017-01-27 ENCOUNTER — Encounter: Payer: Self-pay | Admitting: Internal Medicine

## 2017-01-27 DIAGNOSIS — E440 Moderate protein-calorie malnutrition: Secondary | ICD-10-CM | POA: Diagnosis not present

## 2017-01-27 DIAGNOSIS — E876 Hypokalemia: Secondary | ICD-10-CM

## 2017-01-27 DIAGNOSIS — D62 Acute posthemorrhagic anemia: Secondary | ICD-10-CM | POA: Diagnosis not present

## 2017-01-27 DIAGNOSIS — N3000 Acute cystitis without hematuria: Secondary | ICD-10-CM

## 2017-01-27 DIAGNOSIS — R2681 Unsteadiness on feet: Secondary | ICD-10-CM | POA: Diagnosis not present

## 2017-01-27 DIAGNOSIS — S72012S Unspecified intracapsular fracture of left femur, sequela: Secondary | ICD-10-CM

## 2017-01-27 DIAGNOSIS — K5901 Slow transit constipation: Secondary | ICD-10-CM | POA: Diagnosis not present

## 2017-01-27 NOTE — Progress Notes (Signed)
LOCATION: [facility]  PCP: No primary care provider on file.   Code Status: Full Code  Goals of care: Advanced Directive information Advanced Directives 01/26/2017  Does Patient Have a Medical Advance Directive? Yes  Type of Advance Directive Out of facility DNR (pink MOST or yellow form)  Does patient want to make changes to medical advance directive? No - Patient declined  Would patient like information on creating a medical advance directive? No - Patient declined       Extended Emergency Contact Information Primary Emergency Contact: Delsanto,[name] Address: [address]          [place] [address] [place] Home Phone: [phone] Mobile Phone: [phone] Relation: Son   Allergies  Allergen Reactions  . Sulfa Antibiotics     unknown  . Bee Venom Rash    Chief Complaint  Patient presents with  . New Admit To SNF    New Admission Visit      HPI:  Patient is a 83 y.o. female seen today for short term rehabilitation post hospital admission from 01/21/17-01/25/17 post fall with left hip fracture. She underwent hip pinning on 01/22/17. She is seen in her room today with her son and friend at bedside. She is hard of hearing.   Review of Systems:  Constitutional: Negative for fever, chills, diaphoresis.  HENT: Negative for headache, congestion, nasal discharge, difficulty swallowing.   Eyes: Negative for eye pain, discharge.  Respiratory: Negative for cough, shortness of breath and wheezing.   Cardiovascular: Negative for chest pain, palpitations Gastrointestinal: Negative for heartburn, nausea, vomiting, abdominal pain. Last bowel movement was today.  Genitourinary: Negative for dysuria.  Musculoskeletal: Negative for back pain, fall in the facility.  Skin: Negative for itching, rash.  Neurological: Negative for dizziness. Psychiatric/Behavioral: Negative for depression   Past Medical History:  Diagnosis Date  . Cardiac murmur   . Closed  subcapital fracture of femur, left, initial encounter (HCC)   . Hypertension   . Hypertensive urgency   . Hypokalemia   . Protein-calorie malnutrition, severe (HCC)   . Urinary tract infection    Past Surgical History:  Procedure Laterality Date  . CESAREAN SECTION    . HIP PINNING,CANNULATED Left 01/22/2017   Procedure: CANNULATED HIP PINNING;  Surgeon: Jones Broom, MD;  Location: [facility] OR;  Service: Orthopedics;  Laterality: Left;   Social History:   reports that she has never smoked. She has never used smokeless tobacco. She reports that she does not drink alcohol or use drugs.  Family History  Problem Relation Age of Onset  . Family history unknown: Yes    Medications: Allergies as of 01/27/2017      Reactions   Sulfa Antibiotics    unknown   Bee Venom Rash      Medication List       Accurate as of 01/27/17  4:25 PM. Always use your most recent med list.          acetaminophen 325 MG tablet Commonly known as:  TYLENOL Take 650 mg by mouth every 6 (six) hours as needed for mild pain or moderate pain.   METAMUCIL 28.3 % Powd Generic drug:  Psyllium Take 1 scoop by mouth 2 (two) times daily.   polyethylene glycol packet Commonly known as:  MIRALAX / GLYCOLAX Take 17 g by mouth daily as needed for mild constipation.   traMADol 50 MG tablet Commonly known as:  ULTRAM Take 1 tablet (50 mg total) by mouth every 6 (  six) hours as needed for moderate pain.   UNABLE TO FIND Med Name: Med pass 120 mL by mouth daily       Immunizations: Immunization History  Administered Date(s) Administered  . PPD Test 01/25/2017     Physical Exam: Vitals:   01/27/17 1618  BP: 118/82  Pulse: 79  Resp: 18  Temp: (!) 96.7 F (35.9 C)  TempSrc: Oral  SpO2: 99%  Weight: 71 lb 12.8 oz (32.6 kg)  Height: 4\' 11"  (1.499 m)   Body mass index is 14.5 kg/m.  General- elderly female, frail and thin built, in no acute distress Head- normocephalic, atraumatic Nose- no nasal  discharge Throat- moist mucus membrane Eyes- PERRLA, EOMI, no pallor, no icterus, no discharge, normal conjunctiva, normal sclera Neck- no cervical lymphadenopathy Cardiovascular- normal s1,s2, positive for systolic murmur Respiratory- bilateral clear to auscultation, no wheeze, no rhonchi, no crackles, no use of accessory muscles Abdomen- bowel sounds present, soft, non tender, no guarding or rigidity Musculoskeletal- able to move all 4 extremities, limited left leg ROM, no leg edema Neurological- alert and oriented to person, place and time Skin- warm and dry Psychiatry- normal mood and affect    Labs reviewed: Basic Metabolic Panel:  Recent Labs  16/10/96 2303 01/23/17 0457 01/24/17 0607  NA 139 138 137  K 3.3* 3.9 3.9  CL 104 105 101  CO2 25 25 28   GLUCOSE 112* 87 83  BUN 14 11 14   CREATININE 0.70 0.67 0.69  CALCIUM 9.5 8.3* 8.6*   Liver Function Tests:  Recent Labs  01/21/17 2303  AST 24  ALT 21  ALKPHOS 83  BILITOT 0.8  PROT 7.4  ALBUMIN 4.3   No results for input(s): LIPASE, AMYLASE in the last 8760 hours. No results for input(s): AMMONIA in the last 8760 hours. CBC:  Recent Labs  01/21/17 2303 01/23/17 0457 01/24/17 0607 01/25/17 0533  WBC 12.5* 8.3 9.1 7.1  NEUTROABS 10.7*  --   --   --   HGB 13.9 10.6* 10.4* 10.5*  HCT 40.8 33.2* 32.2* 32.5*  MCV 89.7 94.3 92.5 91.8  PLT 259 173 191 187   Cardiac Enzymes: No results for input(s): CKTOTAL, CKMB, CKMBINDEX, TROPONINI in the last 8760 hours. BNP: Invalid input(s): POCBNP CBG: No results for input(s): GLUCAP in the last 8760 hours.  Radiological Exams: Dg Chest 2 View  Result Date: 01/21/2017 CLINICAL DATA:  LEFT femoral neck fracture EXAM: CHEST  2 VIEW COMPARISON:  None. FINDINGS: Normal cardiac silhouette. Lungs are hyperinflated. No effusion, infiltrate pneumothorax. IMPRESSION: Hyperinflated lungs without acute findings. Electronically Signed   By: Genevive Bi M.D.   On: 01/21/2017  21:56   Dg Knee 2 Views Left  Result Date: 01/21/2017 CLINICAL DATA:  Fall-left leg injury. Pt is presented from home by medics who report that that pt had an unwitnessed fall that she, pt who is AOx4 but hard of hearing, reports she "tripped and fell." She exhibiting some degree of discomfort EXAM: LEFT KNEE - 1-2 VIEW COMPARISON:  None. FINDINGS: No fracture of the proximal tibia or distal femur. Patella is normal. No joint effusion. IMPRESSION: No fracture or dislocation. Electronically Signed   By: Genevive Bi M.D.   On: 01/21/2017 21:53   Pelvis Portable  Result Date: 01/22/2017 CLINICAL DATA:  Status post left femoral neck fracture fixation. EXAM: PORTABLE PELVIS 1-2 VIEWS COMPARISON:  None. FINDINGS: There has been an a fixation of left sub capitellar femoral neck fracture with 3 cancellous screws placement. The  left hip is located. Fracture line is seen through the left femoral neck. No other fractures are seen. Mild osteopenia. IMPRESSION: Left sub capitellar femoral neck fracture fixation without evidence of immediate complications. Electronically Signed   By: Ted Mcalpineobrinka  Dimitrova M.D.   On: 01/22/2017 17:11   Dg C-arm 1-60 Min  Result Date: 01/22/2017 CLINICAL DATA:  Left hip fracture repair. EXAM: DG C-ARM 61-120 MIN; OPERATIVE LEFT HIP WITH PELVIS FLUOROSCOPY TIME:  51 seconds Images: 2 COMPARISON:  None. FINDINGS: The intraoperative images demonstrate the placement of 3 screws across the left hip fracture. The postoperative film demonstrates 3 screws across the nondisplaced left hip fracture. IMPRESSION: Left hip fracture repair as above. Electronically Signed   By: Gerome Samavid  Williams III M.D   On: 01/22/2017 18:10   Dg Hip Operative Unilat W Or W/o Pelvis Left  Result Date: 01/22/2017 CLINICAL DATA:  Left hip fracture repair. EXAM: DG C-ARM 61-120 MIN; OPERATIVE LEFT HIP WITH PELVIS FLUOROSCOPY TIME:  51 seconds Images: 2 COMPARISON:  None. FINDINGS: The intraoperative images  demonstrate the placement of 3 screws across the left hip fracture. The postoperative film demonstrates 3 screws across the nondisplaced left hip fracture. IMPRESSION: Left hip fracture repair as above. Electronically Signed   By: Gerome Samavid  Williams III M.D   On: 01/22/2017 18:10   Dg Hip Unilat W Or Wo Pelvis 2-3 Views Left  Result Date: 01/21/2017 CLINICAL DATA:  Fall, discomfort. EXAM: DG HIP (WITH OR WITHOUT PELVIS) 2-3V LEFT COMPARISON:  None. FINDINGS: There is a fracture of the LEFT femoral neck with shortening. Fracture is in a subcapital location. Hip joint remains intact. IMPRESSION: Subcapital fracture of the LEFT femoral neck with shortening. Electronically Signed   By: Genevive BiStewart  Edmunds M.D.   On: 01/21/2017 21:55   Dg Femur Min 2 Views Left  Result Date: 01/21/2017 CLINICAL DATA:  Fall-left leg injury. Pt is presented from home by medics who report that that pt had an unwitnessed fall that she, pt who is AOx4 but hard of hearing, reports she "tripped and fell." She exhibiting some degree of discomfort on h.*comment was truncated* EXAM: LEFT FEMUR 2 VIEWS COMPARISON:  None. FINDINGS: Limited view of the distal LEFT femur demonstrates no fracture. Proximal femur not imaged. IMPRESSION: No fracture distal femur. Electronically Signed   By: Genevive BiStewart  Edmunds M.D.   On: 01/21/2017 21:53    Assessment/Plan  Unsteady gait Post fall with left hip fracture. Will have patient work with PT/OT as tolerated to regain strength and restore function.  Fall precautions are in place.  Closed Left hip fracture Status post cannulated hip pinning on 01/22/2017. Will need follow-up with orthopedic in 2 weeks. Continue Tylenol 650 mg every 4 hours as needed for pain. Continue aspirin 81 mg twice daily for DVT prophylaxis. Of note this change has been made from 325 mg twice a day per son's request and he is off her about the risks associated with decreased dose of aspirin. Will have her work with physical therapy  and occupational therapy team to help with gait training and muscle strengthening exercises.fall precautions. Skin care. Encourage to be out of bed.   Urinary tract infection Has been treated with antibiotic in the hospital. Currently denies any symptoms. Monitor clinically  Protein calorie malnutrition Currently on med pass a Magic cup supplement. Monitor oral intake and weekly weight.   Acute blood loss anemia Post op from surgery. Monitor CBC.  Hypokalemia Check BMP  Slow transit constipation Currently on Metamucil every other  day and MiraLAX on a needed basis. Monitor bowel movement.      Goals of care: short term rehabilitation   Labs/tests ordered: cbc, bmp  Family/ staff Communication: reviewed care plan with patient, her son and nursing supervisor  I have spent greater than 50 minutes for this encounter which includes reviewing hospital records, addressing above mentioned concerns, reviewing care plan with patient and her son, answering patient's concerns and counseling her.     Oneal Grout, MD Internal Medicine [facility] [address] [place], [place] [address] Cell Phone (Monday-Friday 8 am - 5 pm): [phone] On Call: [phone] and follow prompts after 5 pm and on weekends Office Phone: [phone] Office Fax: [phone]

## 2017-01-31 DIAGNOSIS — M25552 Pain in left hip: Secondary | ICD-10-CM | POA: Diagnosis not present

## 2017-01-31 DIAGNOSIS — S72002A Fracture of unspecified part of neck of left femur, initial encounter for closed fracture: Secondary | ICD-10-CM | POA: Diagnosis not present

## 2017-01-31 DIAGNOSIS — R2681 Unsteadiness on feet: Secondary | ICD-10-CM | POA: Diagnosis not present

## 2017-01-31 DIAGNOSIS — M25562 Pain in left knee: Secondary | ICD-10-CM | POA: Diagnosis not present

## 2017-01-31 DIAGNOSIS — Z5189 Encounter for other specified aftercare: Secondary | ICD-10-CM | POA: Diagnosis not present

## 2017-02-02 DIAGNOSIS — M25562 Pain in left knee: Secondary | ICD-10-CM | POA: Diagnosis not present

## 2017-02-02 DIAGNOSIS — S72002A Fracture of unspecified part of neck of left femur, initial encounter for closed fracture: Secondary | ICD-10-CM | POA: Diagnosis not present

## 2017-02-02 DIAGNOSIS — M25552 Pain in left hip: Secondary | ICD-10-CM | POA: Diagnosis not present

## 2017-02-02 DIAGNOSIS — R2681 Unsteadiness on feet: Secondary | ICD-10-CM | POA: Diagnosis not present

## 2017-02-02 DIAGNOSIS — Z5189 Encounter for other specified aftercare: Secondary | ICD-10-CM | POA: Diagnosis not present

## 2017-02-02 LAB — BASIC METABOLIC PANEL
BUN: 15 mg/dL (ref 4–21)
CREATININE: 0.5 mg/dL (ref 0.5–1.1)
GLUCOSE: 85 mg/dL
POTASSIUM: 4.4 mmol/L (ref 3.4–5.3)
SODIUM: 141 mmol/L (ref 137–147)

## 2017-02-02 LAB — CBC AND DIFFERENTIAL
HCT: 36 % (ref 36–46)
HEMOGLOBIN: 11.2 g/dL — AB (ref 12.0–16.0)
Platelets: 386 10*3/uL (ref 150–399)
WBC: 8.1 10*3/mL

## 2017-02-06 ENCOUNTER — Encounter: Payer: Self-pay | Admitting: Adult Health

## 2017-02-06 ENCOUNTER — Non-Acute Institutional Stay (SKILLED_NURSING_FACILITY): Payer: Medicare Other | Admitting: Adult Health

## 2017-02-06 DIAGNOSIS — K5901 Slow transit constipation: Secondary | ICD-10-CM

## 2017-02-06 DIAGNOSIS — R2681 Unsteadiness on feet: Secondary | ICD-10-CM | POA: Diagnosis not present

## 2017-02-06 DIAGNOSIS — E876 Hypokalemia: Secondary | ICD-10-CM | POA: Diagnosis not present

## 2017-02-06 DIAGNOSIS — I160 Hypertensive urgency: Secondary | ICD-10-CM | POA: Diagnosis not present

## 2017-02-06 DIAGNOSIS — S72012S Unspecified intracapsular fracture of left femur, sequela: Secondary | ICD-10-CM

## 2017-02-06 DIAGNOSIS — E43 Unspecified severe protein-calorie malnutrition: Secondary | ICD-10-CM

## 2017-02-06 DIAGNOSIS — R011 Cardiac murmur, unspecified: Secondary | ICD-10-CM

## 2017-02-06 DIAGNOSIS — D62 Acute posthemorrhagic anemia: Secondary | ICD-10-CM | POA: Diagnosis not present

## 2017-02-06 NOTE — Progress Notes (Signed)
DATE:  02/06/2017  MRN:  [id number]  BIRTHDAY: 24-May-1918  Facility:  Nursing Home Location:  [facility]  Nursing Home Room Number: 1006-A  LEVEL OF CARE:  SNF [phone])  Contact Information    Name Relation Home Work [place] Son [phone]  [phone]       Code Status History    Date Active Date Inactive Code Status Order ID Comments User Context   01/22/2017  3:39 PM 01/25/2017  6:13 PM DNR [id number]  Clydia Llano, MD Inpatient   01/22/2017 12:35 AM 01/22/2017  3:39 PM Full Code [id number]  Briscoe Deutscher, MD ED    Questions for Most Recent Historical Code Status (Order [id number])    Question Answer Comment   In the event of cardiac or respiratory ARREST Do not call a "code blue"    In the event of cardiac or respiratory ARREST Do not perform Intubation, CPR, defibrillation or ACLS    In the event of cardiac or respiratory ARREST Use medication by any route, position, wound care, and other measures to relive pain and suffering. May use oxygen, suction and manual treatment of airway obstruction as needed for comfort.         Advance Directive Documentation     Most Recent Value  Type of Advance Directive  Out of facility DNR (pink MOST or yellow form)  Pre-existing out of facility DNR order (yellow form or pink MOST form)  -  "MOST" Form in Place?  -       Chief Complaint  Patient presents with  . Discharge Note    HISTORY OF PRESENT ILLNESS:  This is a 89-YO female who is for discharge home with Home health PT, OT, CNA, Nursing and SW.  She was admitted to [facility] on 01/25/2017 for short-term rehabilitation following an admission at [facility] for a cannulated hip pinning of the left hip, S/P mechanical fall with subsequent subcapital fracture of the left hip.  She does not take any medications prior to hospitalization.  Patient was admitted to this facility for short-term rehabilitation after the patient's recent  hospitalization.  Patient has completed SNF rehabilitation and therapy has cleared the patient for discharge.     PAST MEDICAL HISTORY:  Past Medical History:  Diagnosis Date  . Cardiac murmur   . Closed subcapital fracture of femur, left, initial encounter (HCC)   . Hypertension   . Hypertensive urgency   . Hypokalemia   . Protein-calorie malnutrition, severe (HCC)   . Urinary tract infection      CURRENT MEDICATIONS: Reviewed  Patient's Medications  New Prescriptions   No medications on file  Previous Medications   ACETAMINOPHEN (TYLENOL) 325 MG TABLET    Take 650 mg by mouth every 4 (four) hours as needed for mild pain or moderate pain.    ASPIRIN EC 81 MG TABLET    Take 81 mg by mouth 2 (two) times daily.   DOCUSATE SODIUM (COLACE) 100 MG CAPSULE    Take 100 mg by mouth daily.   NUTRITIONAL SUPPLEMENT LIQD    Take 1 each by mouth daily. Magic Cup   POLYETHYLENE GLYCOL (MIRALAX / GLYCOLAX) PACKET    Take 17 g by mouth daily as needed for mild constipation.   PSYLLIUM (METAMUCIL) 28.3 % POWD    Take 1 scoop by mouth. 2 scoops BID every other day, on days not taking senna.   SENNOSIDES-DOCUSATE SODIUM (SENOKOT-S) 8.6-50 MG TABLET  Take 1 tablet by mouth every other day.   UNABLE TO FIND    Med Name: Med pass 120 mL by mouth daily  Modified Medications   No medications on file  Discontinued Medications   TRAMADOL (ULTRAM) 50 MG TABLET    Take 1 tablet (50 mg total) by mouth every 6 (six) hours as needed for moderate pain.     Allergies  Allergen Reactions  . Sulfa Antibiotics     unknown  . Bee Venom Rash     REVIEW OF SYSTEMS:  GENERAL: no change in appetite, no fatigue, no weight changes, no fever, chills or weakness EYES: Denies change in vision, dry eyes, eye pain, itching or discharge EARS: Denies change in hearing, ringing in ears, or earache NOSE: Denies nasal congestion or epistaxis MOUTH and THROAT: Denies oral discomfort, gingival pain or bleeding, pain  from teeth or hoarseness   RESPIRATORY: no cough, SOB, DOE, wheezing, hemoptysis CARDIAC: no chest pain, edema or palpitations GI: no abdominal pain, diarrhea, heart burn, nausea or vomiting, +constipation GU: Denies dysuria, frequency, hematuria, incontinence, or discharge PSYCHIATRIC: Denies feeling of depression or anxiety. No report of hallucinations, insomnia, paranoia, or agitation    PHYSICAL EXAMINATION  GENERAL APPEARANCE: Well nourished. In no acute distress. Normal body habitus SKIN:  Left thigh surgical incision has staples , dry and no erythema HEAD: Normal in size and contour. No evidence of trauma EYES: Lids open and close normally. No blepharitis, entropion or ectropion. PERRL. Conjunctivae are clear and sclerae are white. Lenses are without opacity EARS: Pinnae are normal. Hard of hearing MOUTH and THROAT: Lips are without lesions. Oral mucosa is moist and without lesions. Tongue is normal in shape, size, and color and without lesions NECK: supple, trachea midline, no neck masses, no thyroid tenderness, no thyromegaly LYMPHATICS: no LAN in the neck, no supraclavicular LAN RESPIRATORY: breathing is even & unlabored, BS CTAB CARDIAC: RRR, + murmur,no extra heart sounds, no edema GI: abdomen soft, normal BS, no masses, no tenderness, no hepatomegaly, no splenomegaly EXTREMITIES:  Able to move 4 extremities PSYCHIATRIC: Alert and oriented X 3. Affect and behavior are appropriate    LABS/RADIOLOGY: Labs reviewed: Basic Metabolic Panel:  Recent Labs  16/10/96 2303 01/23/17 0457 01/24/17 0607 02/02/17  NA 139 138 137 141  K 3.3* 3.9 3.9 4.4  CL 104 105 101  --   CO2 --   GLUCOSE 112* 87 83  --   BUN CREATININE 0.70 0.67 0.69 0.5  CALCIUM 9.5 8.3* 8.6*  --    Liver Function Tests:  Recent Labs  01/21/17 2303  AST 24  ALT 21  ALKPHOS 83  BILITOT 0.8  PROT 7.4  ALBUMIN 4.3   CBC:  Recent Labs  01/21/17 2303 01/23/17 0457  01/24/17 0607 01/25/17 0533 02/02/17  WBC 12.5* 8.3 9.1 7.1 8.1  NEUTROABS 10.7*  --   --   --   --   HGB 13.9 10.6* 10.4* 10.5* 11.2*  HCT 40.8 33.2* 32.2* 32.5* 36  MCV 89.7 94.3 92.5 91.8  --   PLT 259 173 191 187 386    Dg Chest 2 View  Result Date: 01/21/2017 CLINICAL DATA:  LEFT femoral neck fracture EXAM: CHEST  2 VIEW COMPARISON:  None. FINDINGS: Normal cardiac silhouette. Lungs are hyperinflated. No effusion, infiltrate pneumothorax. IMPRESSION: Hyperinflated lungs without acute findings. Electronically Signed   By: Genevive Bi M.D.   On: 01/21/2017 21:56  Dg Knee 2 Views Left  Result Date: 01/21/2017 CLINICAL DATA:  Fall-left leg injury. Pt is presented from home by medics who report that that pt had an unwitnessed fall that she, pt who is AOx4 but hard of hearing, reports she "tripped and fell." She exhibiting some degree of discomfort EXAM: LEFT KNEE - 1-2 VIEW COMPARISON:  None. FINDINGS: No fracture of the proximal tibia or distal femur. Patella is normal. No joint effusion. IMPRESSION: No fracture or dislocation. Electronically Signed   By: Genevive Bi M.D.   On: 01/21/2017 21:53   Pelvis Portable  Result Date: 01/22/2017 CLINICAL DATA:  Status post left femoral neck fracture fixation. EXAM: PORTABLE PELVIS 1-2 VIEWS COMPARISON:  None. FINDINGS: There has been an a fixation of left sub capitellar femoral neck fracture with 3 cancellous screws placement. The left hip is located. Fracture line is seen through the left femoral neck. No other fractures are seen. Mild osteopenia. IMPRESSION: Left sub capitellar femoral neck fracture fixation without evidence of immediate complications. Electronically Signed   By: Ted Mcalpine M.D.   On: 01/22/2017 17:11   Dg C-arm 1-60 Min  Result Date: 01/22/2017 CLINICAL DATA:  Left hip fracture repair. EXAM: DG C-ARM 61-120 MIN; OPERATIVE LEFT HIP WITH PELVIS FLUOROSCOPY TIME:  51 seconds Images: 2 COMPARISON:  None. FINDINGS:  The intraoperative images demonstrate the placement of 3 screws across the left hip fracture. The postoperative film demonstrates 3 screws across the nondisplaced left hip fracture. IMPRESSION: Left hip fracture repair as above. Electronically Signed   By: Gerome Sam III M.D   On: 01/22/2017 18:10   Dg Hip Operative Unilat W Or W/o Pelvis Left  Result Date: 01/22/2017 CLINICAL DATA:  Left hip fracture repair. EXAM: DG C-ARM 61-120 MIN; OPERATIVE LEFT HIP WITH PELVIS FLUOROSCOPY TIME:  51 seconds Images: 2 COMPARISON:  None. FINDINGS: The intraoperative images demonstrate the placement of 3 screws across the left hip fracture. The postoperative film demonstrates 3 screws across the nondisplaced left hip fracture. IMPRESSION: Left hip fracture repair as above. Electronically Signed   By: Gerome Sam III M.D   On: 01/22/2017 18:10   Dg Hip Unilat W Or Wo Pelvis 2-3 Views Left  Result Date: 01/21/2017 CLINICAL DATA:  Fall, discomfort. EXAM: DG HIP (WITH OR WITHOUT PELVIS) 2-3V LEFT COMPARISON:  None. FINDINGS: There is a fracture of the LEFT femoral neck with shortening. Fracture is in a subcapital location. Hip joint remains intact. IMPRESSION: Subcapital fracture of the LEFT femoral neck with shortening. Electronically Signed   By: Genevive Bi M.D.   On: 01/21/2017 21:55   Dg Femur Min 2 Views Left  Result Date: 01/21/2017 CLINICAL DATA:  Fall-left leg injury. Pt is presented from home by medics who report that that pt had an unwitnessed fall that she, pt who is AOx4 but hard of hearing, reports she "tripped and fell." She exhibiting some degree of discomfort on h.*comment was truncated* EXAM: LEFT FEMUR 2 VIEWS COMPARISON:  None. FINDINGS: Limited view of the distal LEFT femur demonstrates no fracture. Proximal femur not imaged. IMPRESSION: No fracture distal femur. Electronically Signed   By: Genevive Bi M.D.   On: 01/21/2017 21:53    ASSESSMENT/PLAN:  Unsteady gait - for Home  health  PT and OT, for therapeutic strengthening exercises; fall precautions  Closed left subcapital femur fracture - S/P cannulated hip pinning on 01/22/17, follow-up with Dr. Ave Filter, orthopedics, remove staples before discharge; continue Tylenol 325 mg 2 tabs = 650 mg  by mouth every 4 hours when necessary for pain; aspirin EC 81 mg 1 tab by mouth twice a day for DVT prophylaxis  Cardiac murmur - 2-D echo Showed normal LVEF with mild AS; no chest pains  Hypertensive urgency - not on any medications, stable  Constipation - continue Metamucil 1 scoop mixed with liquid by mouth BID every other day, Senna-S 1 tab PO Q other day and MiraLAX when necessary  Acute blood loss anemia - stable Lab Results  Component Value Date   HGB 11.2 (A) 02/02/2017   Hypokalemia -  resolved Lab Results  Component Value Date   K 4.4 02/02/2017    Severe protein calorie malnutrition - RD started her on med pass 120 mL daily and Magic cup daily      I have filled out patient's discharge paperwork and written prescriptions.  Patient will receive home health PT, OT, SW, Nursing and CNA.  DME provided:  None  Total discharge time: Less than 30 minutes  Discharge time involved coordination of the discharge process with social worker, nursing staff and therapy department. Medical justification for home health services verified.    Monina C. Medina-Vargas - NP    [employer] [phone]

## 2017-02-14 DIAGNOSIS — S72012D Unspecified intracapsular fracture of left femur, subsequent encounter for closed fracture with routine healing: Secondary | ICD-10-CM | POA: Diagnosis not present

## 2017-02-14 DIAGNOSIS — Z9181 History of falling: Secondary | ICD-10-CM | POA: Diagnosis not present

## 2017-02-14 DIAGNOSIS — Z87440 Personal history of urinary (tract) infections: Secondary | ICD-10-CM | POA: Diagnosis not present

## 2017-02-14 DIAGNOSIS — I10 Essential (primary) hypertension: Secondary | ICD-10-CM | POA: Diagnosis not present

## 2017-02-14 DIAGNOSIS — W19XXXD Unspecified fall, subsequent encounter: Secondary | ICD-10-CM | POA: Diagnosis not present

## 2017-02-14 DIAGNOSIS — E43 Unspecified severe protein-calorie malnutrition: Secondary | ICD-10-CM | POA: Diagnosis not present

## 2017-02-15 DIAGNOSIS — S72092A Other fracture of head and neck of left femur, initial encounter for closed fracture: Secondary | ICD-10-CM | POA: Diagnosis not present

## 2017-02-16 DIAGNOSIS — E43 Unspecified severe protein-calorie malnutrition: Secondary | ICD-10-CM | POA: Diagnosis not present

## 2017-02-16 DIAGNOSIS — W19XXXD Unspecified fall, subsequent encounter: Secondary | ICD-10-CM | POA: Diagnosis not present

## 2017-02-16 DIAGNOSIS — I10 Essential (primary) hypertension: Secondary | ICD-10-CM | POA: Diagnosis not present

## 2017-02-16 DIAGNOSIS — Z87440 Personal history of urinary (tract) infections: Secondary | ICD-10-CM | POA: Diagnosis not present

## 2017-02-16 DIAGNOSIS — Z9181 History of falling: Secondary | ICD-10-CM | POA: Diagnosis not present

## 2017-02-16 DIAGNOSIS — S72012D Unspecified intracapsular fracture of left femur, subsequent encounter for closed fracture with routine healing: Secondary | ICD-10-CM | POA: Diagnosis not present

## 2017-02-17 DIAGNOSIS — W19XXXD Unspecified fall, subsequent encounter: Secondary | ICD-10-CM | POA: Diagnosis not present

## 2017-02-17 DIAGNOSIS — E43 Unspecified severe protein-calorie malnutrition: Secondary | ICD-10-CM | POA: Diagnosis not present

## 2017-02-17 DIAGNOSIS — Z87440 Personal history of urinary (tract) infections: Secondary | ICD-10-CM | POA: Diagnosis not present

## 2017-02-17 DIAGNOSIS — S72012D Unspecified intracapsular fracture of left femur, subsequent encounter for closed fracture with routine healing: Secondary | ICD-10-CM | POA: Diagnosis not present

## 2017-02-17 DIAGNOSIS — I10 Essential (primary) hypertension: Secondary | ICD-10-CM | POA: Diagnosis not present

## 2017-02-17 DIAGNOSIS — Z9181 History of falling: Secondary | ICD-10-CM | POA: Diagnosis not present

## 2017-02-20 DIAGNOSIS — W19XXXD Unspecified fall, subsequent encounter: Secondary | ICD-10-CM | POA: Diagnosis not present

## 2017-02-20 DIAGNOSIS — S72012D Unspecified intracapsular fracture of left femur, subsequent encounter for closed fracture with routine healing: Secondary | ICD-10-CM | POA: Diagnosis not present

## 2017-02-20 DIAGNOSIS — E43 Unspecified severe protein-calorie malnutrition: Secondary | ICD-10-CM | POA: Diagnosis not present

## 2017-02-20 DIAGNOSIS — I10 Essential (primary) hypertension: Secondary | ICD-10-CM | POA: Diagnosis not present

## 2017-02-20 DIAGNOSIS — Z9181 History of falling: Secondary | ICD-10-CM | POA: Diagnosis not present

## 2017-02-20 DIAGNOSIS — Z87440 Personal history of urinary (tract) infections: Secondary | ICD-10-CM | POA: Diagnosis not present

## 2017-02-21 DIAGNOSIS — E43 Unspecified severe protein-calorie malnutrition: Secondary | ICD-10-CM | POA: Diagnosis not present

## 2017-02-21 DIAGNOSIS — Z87440 Personal history of urinary (tract) infections: Secondary | ICD-10-CM | POA: Diagnosis not present

## 2017-02-21 DIAGNOSIS — Z9181 History of falling: Secondary | ICD-10-CM | POA: Diagnosis not present

## 2017-02-21 DIAGNOSIS — W19XXXD Unspecified fall, subsequent encounter: Secondary | ICD-10-CM | POA: Diagnosis not present

## 2017-02-21 DIAGNOSIS — S72012D Unspecified intracapsular fracture of left femur, subsequent encounter for closed fracture with routine healing: Secondary | ICD-10-CM | POA: Diagnosis not present

## 2017-02-21 DIAGNOSIS — I10 Essential (primary) hypertension: Secondary | ICD-10-CM | POA: Diagnosis not present

## 2017-02-22 DIAGNOSIS — W19XXXD Unspecified fall, subsequent encounter: Secondary | ICD-10-CM | POA: Diagnosis not present

## 2017-02-22 DIAGNOSIS — S72012D Unspecified intracapsular fracture of left femur, subsequent encounter for closed fracture with routine healing: Secondary | ICD-10-CM | POA: Diagnosis not present

## 2017-02-22 DIAGNOSIS — Z87440 Personal history of urinary (tract) infections: Secondary | ICD-10-CM | POA: Diagnosis not present

## 2017-02-22 DIAGNOSIS — Z9181 History of falling: Secondary | ICD-10-CM | POA: Diagnosis not present

## 2017-02-22 DIAGNOSIS — I10 Essential (primary) hypertension: Secondary | ICD-10-CM | POA: Diagnosis not present

## 2017-02-22 DIAGNOSIS — E43 Unspecified severe protein-calorie malnutrition: Secondary | ICD-10-CM | POA: Diagnosis not present

## 2017-02-23 DIAGNOSIS — I10 Essential (primary) hypertension: Secondary | ICD-10-CM | POA: Diagnosis not present

## 2017-02-23 DIAGNOSIS — W19XXXD Unspecified fall, subsequent encounter: Secondary | ICD-10-CM | POA: Diagnosis not present

## 2017-02-23 DIAGNOSIS — E43 Unspecified severe protein-calorie malnutrition: Secondary | ICD-10-CM | POA: Diagnosis not present

## 2017-02-23 DIAGNOSIS — Z87440 Personal history of urinary (tract) infections: Secondary | ICD-10-CM | POA: Diagnosis not present

## 2017-02-23 DIAGNOSIS — S72012D Unspecified intracapsular fracture of left femur, subsequent encounter for closed fracture with routine healing: Secondary | ICD-10-CM | POA: Diagnosis not present

## 2017-02-23 DIAGNOSIS — Z9181 History of falling: Secondary | ICD-10-CM | POA: Diagnosis not present

## 2017-02-27 DIAGNOSIS — I10 Essential (primary) hypertension: Secondary | ICD-10-CM | POA: Diagnosis not present

## 2017-02-27 DIAGNOSIS — W19XXXD Unspecified fall, subsequent encounter: Secondary | ICD-10-CM | POA: Diagnosis not present

## 2017-02-27 DIAGNOSIS — Z9181 History of falling: Secondary | ICD-10-CM | POA: Diagnosis not present

## 2017-02-27 DIAGNOSIS — S72012D Unspecified intracapsular fracture of left femur, subsequent encounter for closed fracture with routine healing: Secondary | ICD-10-CM | POA: Diagnosis not present

## 2017-02-27 DIAGNOSIS — Z87440 Personal history of urinary (tract) infections: Secondary | ICD-10-CM | POA: Diagnosis not present

## 2017-02-27 DIAGNOSIS — E43 Unspecified severe protein-calorie malnutrition: Secondary | ICD-10-CM | POA: Diagnosis not present

## 2017-02-28 DIAGNOSIS — Z87440 Personal history of urinary (tract) infections: Secondary | ICD-10-CM | POA: Diagnosis not present

## 2017-02-28 DIAGNOSIS — I10 Essential (primary) hypertension: Secondary | ICD-10-CM | POA: Diagnosis not present

## 2017-02-28 DIAGNOSIS — E43 Unspecified severe protein-calorie malnutrition: Secondary | ICD-10-CM | POA: Diagnosis not present

## 2017-02-28 DIAGNOSIS — Z9181 History of falling: Secondary | ICD-10-CM | POA: Diagnosis not present

## 2017-02-28 DIAGNOSIS — W19XXXD Unspecified fall, subsequent encounter: Secondary | ICD-10-CM | POA: Diagnosis not present

## 2017-02-28 DIAGNOSIS — S72012D Unspecified intracapsular fracture of left femur, subsequent encounter for closed fracture with routine healing: Secondary | ICD-10-CM | POA: Diagnosis not present

## 2017-03-03 DIAGNOSIS — Z9181 History of falling: Secondary | ICD-10-CM | POA: Diagnosis not present

## 2017-03-03 DIAGNOSIS — I10 Essential (primary) hypertension: Secondary | ICD-10-CM | POA: Diagnosis not present

## 2017-03-03 DIAGNOSIS — E43 Unspecified severe protein-calorie malnutrition: Secondary | ICD-10-CM | POA: Diagnosis not present

## 2017-03-03 DIAGNOSIS — S72012D Unspecified intracapsular fracture of left femur, subsequent encounter for closed fracture with routine healing: Secondary | ICD-10-CM | POA: Diagnosis not present

## 2017-03-03 DIAGNOSIS — Z87440 Personal history of urinary (tract) infections: Secondary | ICD-10-CM | POA: Diagnosis not present

## 2017-03-03 DIAGNOSIS — W19XXXD Unspecified fall, subsequent encounter: Secondary | ICD-10-CM | POA: Diagnosis not present

## 2017-03-06 DIAGNOSIS — Z9181 History of falling: Secondary | ICD-10-CM | POA: Diagnosis not present

## 2017-03-06 DIAGNOSIS — I10 Essential (primary) hypertension: Secondary | ICD-10-CM | POA: Diagnosis not present

## 2017-03-06 DIAGNOSIS — E43 Unspecified severe protein-calorie malnutrition: Secondary | ICD-10-CM | POA: Diagnosis not present

## 2017-03-06 DIAGNOSIS — S72012D Unspecified intracapsular fracture of left femur, subsequent encounter for closed fracture with routine healing: Secondary | ICD-10-CM | POA: Diagnosis not present

## 2017-03-06 DIAGNOSIS — W19XXXD Unspecified fall, subsequent encounter: Secondary | ICD-10-CM | POA: Diagnosis not present

## 2017-03-06 DIAGNOSIS — Z87440 Personal history of urinary (tract) infections: Secondary | ICD-10-CM | POA: Diagnosis not present

## 2017-03-07 DIAGNOSIS — S72012D Unspecified intracapsular fracture of left femur, subsequent encounter for closed fracture with routine healing: Secondary | ICD-10-CM | POA: Diagnosis not present

## 2017-03-07 DIAGNOSIS — W19XXXD Unspecified fall, subsequent encounter: Secondary | ICD-10-CM | POA: Diagnosis not present

## 2017-03-07 DIAGNOSIS — Z87440 Personal history of urinary (tract) infections: Secondary | ICD-10-CM | POA: Diagnosis not present

## 2017-03-07 DIAGNOSIS — Z9181 History of falling: Secondary | ICD-10-CM | POA: Diagnosis not present

## 2017-03-07 DIAGNOSIS — I10 Essential (primary) hypertension: Secondary | ICD-10-CM | POA: Diagnosis not present

## 2017-03-07 DIAGNOSIS — E43 Unspecified severe protein-calorie malnutrition: Secondary | ICD-10-CM | POA: Diagnosis not present

## 2017-03-08 DIAGNOSIS — I10 Essential (primary) hypertension: Secondary | ICD-10-CM | POA: Diagnosis not present

## 2017-03-08 DIAGNOSIS — W19XXXD Unspecified fall, subsequent encounter: Secondary | ICD-10-CM | POA: Diagnosis not present

## 2017-03-08 DIAGNOSIS — Z9181 History of falling: Secondary | ICD-10-CM | POA: Diagnosis not present

## 2017-03-08 DIAGNOSIS — E43 Unspecified severe protein-calorie malnutrition: Secondary | ICD-10-CM | POA: Diagnosis not present

## 2017-03-08 DIAGNOSIS — S72012D Unspecified intracapsular fracture of left femur, subsequent encounter for closed fracture with routine healing: Secondary | ICD-10-CM | POA: Diagnosis not present

## 2017-03-08 DIAGNOSIS — Z87440 Personal history of urinary (tract) infections: Secondary | ICD-10-CM | POA: Diagnosis not present

## 2017-03-10 ENCOUNTER — Inpatient Hospital Stay (HOSPITAL_COMMUNITY)
Admission: EM | Admit: 2017-03-10 | Discharge: 2017-03-13 | DRG: 469 | Disposition: A | Discharge status: Skilled Nursing Facility | Payer: Medicare Other | Attending: Internal Medicine | Admitting: Internal Medicine

## 2017-03-10 ENCOUNTER — Encounter (HOSPITAL_COMMUNITY): Payer: Self-pay | Admitting: *Deleted

## 2017-03-10 ENCOUNTER — Emergency Department (HOSPITAL_COMMUNITY): Payer: Medicare Other

## 2017-03-10 DIAGNOSIS — I5032 Chronic diastolic (congestive) heart failure: Secondary | ICD-10-CM | POA: Diagnosis present

## 2017-03-10 DIAGNOSIS — Z96641 Presence of right artificial hip joint: Secondary | ICD-10-CM | POA: Diagnosis not present

## 2017-03-10 DIAGNOSIS — D72829 Elevated white blood cell count, unspecified: Secondary | ICD-10-CM | POA: Diagnosis not present

## 2017-03-10 DIAGNOSIS — S92153A Displaced avulsion fracture (chip fracture) of unspecified talus, initial encounter for closed fracture: Secondary | ICD-10-CM | POA: Diagnosis not present

## 2017-03-10 DIAGNOSIS — M79605 Pain in left leg: Secondary | ICD-10-CM | POA: Diagnosis not present

## 2017-03-10 DIAGNOSIS — W19XXXA Unspecified fall, initial encounter: Secondary | ICD-10-CM | POA: Diagnosis not present

## 2017-03-10 DIAGNOSIS — R262 Difficulty in walking, not elsewhere classified: Secondary | ICD-10-CM | POA: Diagnosis not present

## 2017-03-10 DIAGNOSIS — R1310 Dysphagia, unspecified: Secondary | ICD-10-CM | POA: Diagnosis present

## 2017-03-10 DIAGNOSIS — R011 Cardiac murmur, unspecified: Secondary | ICD-10-CM | POA: Diagnosis present

## 2017-03-10 DIAGNOSIS — R296 Repeated falls: Secondary | ICD-10-CM | POA: Diagnosis not present

## 2017-03-10 DIAGNOSIS — Z66 Do not resuscitate: Secondary | ICD-10-CM | POA: Diagnosis present

## 2017-03-10 DIAGNOSIS — K5900 Constipation, unspecified: Secondary | ICD-10-CM | POA: Diagnosis present

## 2017-03-10 DIAGNOSIS — I350 Nonrheumatic aortic (valve) stenosis: Secondary | ICD-10-CM | POA: Diagnosis not present

## 2017-03-10 DIAGNOSIS — Z87440 Personal history of urinary (tract) infections: Secondary | ICD-10-CM | POA: Diagnosis not present

## 2017-03-10 DIAGNOSIS — I700 Atherosclerosis of aorta: Secondary | ICD-10-CM | POA: Diagnosis present

## 2017-03-10 DIAGNOSIS — S72002S Fracture of unspecified part of neck of left femur, sequela: Secondary | ICD-10-CM | POA: Diagnosis not present

## 2017-03-10 DIAGNOSIS — Z681 Body mass index (BMI) 19 or less, adult: Secondary | ICD-10-CM

## 2017-03-10 DIAGNOSIS — M25552 Pain in left hip: Secondary | ICD-10-CM | POA: Diagnosis not present

## 2017-03-10 DIAGNOSIS — M6281 Muscle weakness (generalized): Secondary | ICD-10-CM | POA: Diagnosis not present

## 2017-03-10 DIAGNOSIS — S72002A Fracture of unspecified part of neck of left femur, initial encounter for closed fracture: Secondary | ICD-10-CM | POA: Diagnosis not present

## 2017-03-10 DIAGNOSIS — I110 Hypertensive heart disease with heart failure: Secondary | ICD-10-CM | POA: Diagnosis present

## 2017-03-10 DIAGNOSIS — Z96642 Presence of left artificial hip joint: Secondary | ICD-10-CM | POA: Diagnosis not present

## 2017-03-10 DIAGNOSIS — Z471 Aftercare following joint replacement surgery: Secondary | ICD-10-CM | POA: Diagnosis not present

## 2017-03-10 DIAGNOSIS — W1830XA Fall on same level, unspecified, initial encounter: Secondary | ICD-10-CM | POA: Diagnosis present

## 2017-03-10 DIAGNOSIS — S728X9A Other fracture of unspecified femur, initial encounter for closed fracture: Secondary | ICD-10-CM | POA: Diagnosis not present

## 2017-03-10 DIAGNOSIS — Z91030 Bee allergy status: Secondary | ICD-10-CM | POA: Diagnosis not present

## 2017-03-10 DIAGNOSIS — I272 Pulmonary hypertension, unspecified: Secondary | ICD-10-CM | POA: Diagnosis not present

## 2017-03-10 DIAGNOSIS — Z79899 Other long term (current) drug therapy: Secondary | ICD-10-CM

## 2017-03-10 DIAGNOSIS — F0390 Unspecified dementia without behavioral disturbance: Secondary | ICD-10-CM | POA: Diagnosis present

## 2017-03-10 DIAGNOSIS — H9190 Unspecified hearing loss, unspecified ear: Secondary | ICD-10-CM | POA: Diagnosis present

## 2017-03-10 DIAGNOSIS — Z7982 Long term (current) use of aspirin: Secondary | ICD-10-CM | POA: Diagnosis not present

## 2017-03-10 DIAGNOSIS — R278 Other lack of coordination: Secondary | ICD-10-CM | POA: Diagnosis not present

## 2017-03-10 DIAGNOSIS — R079 Chest pain, unspecified: Secondary | ICD-10-CM | POA: Diagnosis not present

## 2017-03-10 DIAGNOSIS — Z882 Allergy status to sulfonamides status: Secondary | ICD-10-CM

## 2017-03-10 DIAGNOSIS — J9811 Atelectasis: Secondary | ICD-10-CM | POA: Diagnosis present

## 2017-03-10 DIAGNOSIS — S8991XA Unspecified injury of right lower leg, initial encounter: Secondary | ICD-10-CM | POA: Diagnosis not present

## 2017-03-10 DIAGNOSIS — E43 Unspecified severe protein-calorie malnutrition: Secondary | ICD-10-CM | POA: Diagnosis not present

## 2017-03-10 DIAGNOSIS — S79912A Unspecified injury of left hip, initial encounter: Secondary | ICD-10-CM | POA: Diagnosis not present

## 2017-03-10 DIAGNOSIS — S72092A Other fracture of head and neck of left femur, initial encounter for closed fracture: Secondary | ICD-10-CM | POA: Diagnosis not present

## 2017-03-10 DIAGNOSIS — E876 Hypokalemia: Secondary | ICD-10-CM | POA: Diagnosis not present

## 2017-03-10 DIAGNOSIS — S72009A Fracture of unspecified part of neck of unspecified femur, initial encounter for closed fracture: Secondary | ICD-10-CM

## 2017-03-10 DIAGNOSIS — R1312 Dysphagia, oropharyngeal phase: Secondary | ICD-10-CM | POA: Diagnosis not present

## 2017-03-10 DIAGNOSIS — Z791 Long term (current) use of non-steroidal anti-inflammatories (NSAID): Secondary | ICD-10-CM | POA: Diagnosis not present

## 2017-03-10 DIAGNOSIS — Z01818 Encounter for other preprocedural examination: Secondary | ICD-10-CM

## 2017-03-10 DIAGNOSIS — T84195A Other mechanical complication of internal fixation device of left femur, initial encounter: Secondary | ICD-10-CM | POA: Diagnosis not present

## 2017-03-10 DIAGNOSIS — R636 Underweight: Secondary | ICD-10-CM | POA: Diagnosis not present

## 2017-03-10 DIAGNOSIS — S299XXA Unspecified injury of thorax, initial encounter: Secondary | ICD-10-CM | POA: Diagnosis not present

## 2017-03-10 DIAGNOSIS — Z96649 Presence of unspecified artificial hip joint: Secondary | ICD-10-CM

## 2017-03-10 DIAGNOSIS — I10 Essential (primary) hypertension: Secondary | ICD-10-CM | POA: Diagnosis not present

## 2017-03-10 LAB — COMPREHENSIVE METABOLIC PANEL
ALK PHOS: 86 U/L (ref 38–126)
ALT: 16 U/L (ref 14–54)
ANION GAP: 11 (ref 5–15)
AST: 26 U/L (ref 15–41)
Albumin: 3.5 g/dL (ref 3.5–5.0)
BILIRUBIN TOTAL: 0.8 mg/dL (ref 0.3–1.2)
BUN: 30 mg/dL — ABNORMAL HIGH (ref 6–20)
CALCIUM: 9.3 mg/dL (ref 8.9–10.3)
CO2: 31 mmol/L (ref 22–32)
Chloride: 98 mmol/L — ABNORMAL LOW (ref 101–111)
Creatinine, Ser: 0.62 mg/dL (ref 0.44–1.00)
GFR calc non Af Amer: >60 mL/min (ref >60)
GLUCOSE: 124 mg/dL — AB (ref 65–99)
POTASSIUM: 3.8 mmol/L (ref 3.5–5.1)
SODIUM: 140 mmol/L (ref 135–145)
Total Protein: 6.7 g/dL (ref 6.5–8.1)

## 2017-03-10 LAB — CBC
HEMATOCRIT: 38.6 % (ref 36.0–46.0)
HEMOGLOBIN: 12.4 g/dL (ref 12.0–15.0)
MCH: 29.9 pg (ref 26.0–34.0)
MCHC: 32.1 g/dL (ref 30.0–36.0)
MCV: 93 fL (ref 78.0–100.0)
Platelets: 303 10*3/uL (ref 150–400)
RBC: 4.15 MIL/uL (ref 3.87–5.11)
RDW: 12.9 % (ref 11.5–15.5)
WBC: 14.7 10*3/uL — ABNORMAL HIGH (ref 4.0–10.5)

## 2017-03-10 MED ORDER — ASPIRIN EC 81 MG PO TBEC
81.0000 mg | DELAYED_RELEASE_TABLET | Freq: Two times a day (BID) | ORAL | Status: DC
Start: 2017-03-11 — End: 2017-03-13
  Administered 2017-03-12 – 2017-03-13 (×3): 81 mg via ORAL
  Filled 2017-03-10 (×3): qty 1

## 2017-03-10 MED ORDER — POVIDONE-IODINE 10 % EX SWAB: 2.0000 "application " | Freq: Once | CUTANEOUS | Status: DC

## 2017-03-10 MED ORDER — BOOST / RESOURCE BREEZE PO LIQD: 1.0000 | Freq: Every day | ORAL | Status: DC

## 2017-03-10 MED ORDER — DOCUSATE SODIUM 100 MG PO CAPS
100.0000 mg | ORAL_CAPSULE | Freq: Every day | ORAL | Status: DC
  Administered 2017-03-10 – 2017-03-12 (×2): 100 mg via ORAL
  Filled 2017-03-10 (×2): qty 1

## 2017-03-10 MED ORDER — ACETAMINOPHEN 325 MG PO TABS
650.0000 mg | ORAL_TABLET | Freq: Four times a day (QID) | ORAL | Status: DC | PRN
  Administered 2017-03-10: 650 mg via ORAL

## 2017-03-10 MED ORDER — ACETAMINOPHEN 160 MG/5ML PO SOLN
1000.0000 mg | Freq: Once | ORAL | Status: AC
  Administered 2017-03-10: 1000 mg via ORAL
  Filled 2017-03-10: qty 40.6

## 2017-03-10 MED ORDER — SENNOSIDES-DOCUSATE SODIUM 8.6-50 MG PO TABS
1.0000 | ORAL_TABLET | ORAL | Status: DC
  Administered 2017-03-12: 1 via ORAL
  Filled 2017-03-10: qty 1

## 2017-03-10 MED ORDER — SODIUM CHLORIDE 0.9 % IV SOLN
INTRAVENOUS | Status: AC
  Administered 2017-03-10: 21:00:00 via INTRAVENOUS

## 2017-03-10 MED ORDER — SENNA-DOCUSATE SODIUM 8.6-50 MG PO TABS: 1.0000 | ORAL_TABLET | ORAL | Status: DC

## 2017-03-10 MED ORDER — CHLORHEXIDINE GLUCONATE 4 % EX LIQD: 60.0000 mL | Freq: Once | CUTANEOUS | Status: DC

## 2017-03-10 MED ORDER — SENNOSIDES-DOCUSATE SODIUM 8.6-50 MG PO TABS: 1.0000 | ORAL_TABLET | ORAL | Status: DC

## 2017-03-10 MED ORDER — CEFAZOLIN SODIUM-DEXTROSE 2-4 GM/100ML-% IV SOLN
2.0000 g | INTRAVENOUS | Status: DC
  Filled 2017-03-10: qty 100

## 2017-03-10 MED ORDER — POLYETHYLENE GLYCOL 3350 17 G PO PACK: 17.0000 g | PACK | Freq: Every day | ORAL | Status: DC | PRN

## 2017-03-10 MED ORDER — NUTRITIONAL SUPPLEMENT PO LIQD: 1.0000 | Freq: Every day | ORAL | Status: DC

## 2017-03-10 MED ORDER — PSYLLIUM 95 % PO PACK
1.0000 | PACK | ORAL | Status: DC
  Administered 2017-03-13: 1 via ORAL
  Filled 2017-03-10 (×2): qty 1

## 2017-03-10 MED ORDER — ACETAMINOPHEN 500 MG PO TABS
1000.0000 mg | ORAL_TABLET | Freq: Once | ORAL | Status: DC
  Filled 2017-03-10: qty 2

## 2017-03-10 MED ORDER — TRANEXAMIC ACID 1000 MG/10ML IV SOLN
2000.0000 mg | INTRAVENOUS | Status: DC
  Filled 2017-03-10: qty 20

## 2017-03-10 NOTE — Anesthesia Preprocedure Evaluation (Addendum)
Anesthesia Evaluation  Patient identified by MRN, date of birth, ID band Patient awake    Reviewed: Allergy & Precautions, NPO status , Patient's Chart, lab work & pertinent test results  Airway Mallampati: II  TM Distance: >3 FB Neck ROM: Full    Dental  (+) Dental Advisory Given, Edentulous Upper, Edentulous Lower   Pulmonary neg pulmonary ROS,    Pulmonary exam normal breath sounds clear to auscultation       Cardiovascular hypertension, + Valvular Problems/Murmurs AS  Rhythm:Regular Rate:Normal + Systolic murmurs Echo 01/22/17: Study Conclusions  - Left ventricle: The cavity size was normal. Wall thickness was normal. Systolic function was normal. The estimated ejection fraction was in the range of 60% to 65%. Wall motion was normal; there were no regional wall motion abnormalities. Doppler parameters are consistent with abnormal left ventricular relaxation (grade 1 diastolic dysfunction). - Aortic valve: Moderately calcified annulus. Severely thickened, severely calcified leaflets. There was mild stenosis. Valve area (VTI): 1.16 cm^2. Valve area (Vmax): 1.03 cm^2. Valve area (Vmean): 1.01 cm^2. - Mitral valve: Mildly to moderately calcified annulus. Valve area by continuity equation (using LVOT flow): 1.27 cm^2. - Tricuspid valve: There was mild-moderate regurgitation. - Pulmonary arteries: Systolic pressure was moderately increased. PA peak pressure: 62 mm Hg (S).  Impressions:  - Normal LV systlic function. Greade 1 diastolic dysfunction Mild AS Moderate pulmonary HTN   Neuro/Psych negative neurological ROS     GI/Hepatic negative GI ROS, Neg liver ROS,   Endo/Other  negative endocrine ROS  Renal/GU negative Renal ROS     Musculoskeletal LEFT HIP FRACTURE Protein-calorie malnutrition, severe    Abdominal   Peds  Hematology negative hematology ROS (+) Plt 303k   Anesthesia Other Findings Day of surgery  medications reviewed with the patient.  Reproductive/Obstetrics                            Anesthesia Physical Anesthesia Plan  ASA: III  Anesthesia Plan: Spinal   Post-op Pain Management:    Induction:   Airway Management Planned:   Additional Equipment:   Intra-op Plan:   Post-operative Plan:   Informed Consent: I have reviewed the patients History and Physical, chart, labs and discussed the procedure including the risks, benefits and alternatives for the proposed anesthesia with the patient or authorized representative who has indicated his/her understanding and acceptance.   Dental advisory given  Plan Discussed with: CRNA  Anesthesia Plan Comments: (Discussed risks and benefits of and differences between spinal and general. Discussed risks of spinal including headache, backache, failure, bleeding, infection, and nerve damage. Patient consents to spinal. Questions answered. Coagulation studies and platelet count acceptable.)       Anesthesia Quick Evaluation

## 2017-03-10 NOTE — Consult Note (Signed)
Marcene Corning, MD  Bryna Colander, PA-C  Elodia Florence, PA-C                                  [facility]                [address] [place], [place]  [address]   ORTHOPAEDIC CONSULTATION  Jamie Carlson            MRN:  [id number] DOB/SEX:  [birth year]/female     CHIEF COMPLAINT:  Painful left leg  HISTORY: Jamie Carlson a 83 y.o. female with history of hip pinning by Dr Ave Filter this year.  Was doing great but fell recently and now hurting much more.  Difficulty walking now.  Seen with her son.   PAST MEDICAL HISTORY: Patient Active Problem List   Diagnosis Date Noted  . Constipation 01/26/2017  . Protein-calorie malnutrition, severe 01/24/2017  . Essential hypertension 01/22/2017  . Hypertensive urgency 01/22/2017  . Hypokalemia 01/22/2017  . Closed subcapital fracture of femur, left, initial encounter (HCC) 01/22/2017  . Cardiac murmur 01/22/2017  . Cystitis   . Fall    Past Medical History:  Diagnosis Date  . Cardiac murmur   . Closed subcapital fracture of femur, left, initial encounter (HCC)   . Hypertension   . Hypertensive urgency   . Hypokalemia   . Protein-calorie malnutrition, severe (HCC)   . Urinary tract infection    Past Surgical History:  Procedure Laterality Date  . CESAREAN SECTION    . HIP PINNING,CANNULATED Left 01/22/2017   Procedure: CANNULATED HIP PINNING;  Surgeon: Jones Broom, MD;  Location: [facility] OR;  Service: Orthopedics;  Laterality: Left;     MEDICATIONS:  No current facility-administered medications for this encounter.   Current Outpatient Prescriptions:  .  acetaminophen (TYLENOL) 325 MG tablet, Take 650 mg by mouth every 4 (four) hours as needed for mild pain or moderate pain. , Disp: , Rfl:  .  aspirin EC 81 MG tablet, Take 81 mg by mouth 2 (two) times daily., Disp: , Rfl:  .  docusate sodium (COLACE) 100 MG capsule, Take 100 mg by mouth daily., Disp: , Rfl:  .  NUTRITIONAL SUPPLEMENT LIQD, Take 1 each by mouth  daily. Magic Cup, Disp: , Rfl:  .  polyethylene glycol (MIRALAX / GLYCOLAX) packet, Take 17 g by mouth daily as needed for mild constipation., Disp: 14 each, Rfl: 0 .  Psyllium (METAMUCIL) 28.3 % POWD, Take 1 scoop by mouth. 2 scoops BID every other day, on days not taking senna., Disp: , Rfl:  .  sennosides-docusate sodium (SENOKOT-S) 8.6-50 MG tablet, Take 1 tablet by mouth every other day., Disp: , Rfl:  .  UNABLE TO FIND, Med Name: Med pass 120 mL by mouth daily, Disp: , Rfl:   ALLERGIES:   Allergies  Allergen Reactions  . Sulfa Antibiotics     unknown  . Bee Venom Rash    REVIEW OF SYSTEMS: REVIEWED IN DETAIL IN CHART  FAMILY HISTORY:   Family History  Problem Relation Age of Onset  . Family history unknown: Yes    SOCIAL HISTORY:   Social History  Substance Use Topics  . Smoking status: Never Smoker  . Smokeless tobacco: Never Used  . Alcohol use No     EXAMINATION: Vital signs in last 24 hours: Temp:  [98 F (36.7 C)] 98 F (36.7 C) (05/04 1527) Pulse Rate:  [  88] 88 (05/04 1527) Resp:  [16] 16 (05/04 1527) BP: (150)/(90) 150/90 (05/04 1527) SpO2:  [97 %] 97 % (05/04 1527) Weight:  [32.7 kg (72 lb)] 32.7 kg (72 lb) (05/04 1540)  There were no vitals taken for this visit.  General Appearance:    Alert, cooperative, no distress, appears stated age  Head:    Normocephalic, without obvious abnormality, atraumatic  Eyes:    PERRL, conjunctiva/corneas clear, EOM's intact, fundi    benign, both eyes  Ears:    Normal TM's and external ear canals, both ears  Nose:   Nares normal, septum midline, mucosa normal, no drainage    or sinus tenderness  Throat:   Lips, mucosa, and tongue normal; teeth and gums normal  Neck:   Supple, symmetrical, trachea midline, no adenopathy;    thyroid:  no enlargement/tenderness/nodules; no carotid   bruit or JVD  Back:     Symmetric, no curvature, ROM normal, no CVA tenderness  Lungs:     Clear to auscultation bilaterally, respirations  unlabored  Chest Wall:    No tenderness or deformity   Heart:    Regular rate and rhythm, S1 and S2 normal, no murmur, rub   or gallop  Breast Exam:    No tenderness, masses, or nipple abnormality  Abdomen:     Soft, non-tender, bowel sounds active all four quadrants,    no masses, no organomegaly  Genitalia:    Rectal:    Extremities:   Extremities normal, atraumatic, no cyanosis or edema except left leg  Pulses:   2+ and symmetric all extremities  Skin:   Skin color, texture, turgor normal, no rashes or lesions  Lymph nodes:   Cervical, supraclavicular, and axillary nodes normal  Neurologic:   CNII-XII intact, normal strength, sensation and reflexes    throughout    Musculoskeletal Exam:   Left leg shot and painful to rotation   DIAGNOSTIC STUDIES: Recent laboratory studies: No results for input(s): WBC, HGB, HCT, PLT in the last 168 hours. No results for input(s): NA, K, CL, CO2, BUN, CREATININE, GLUCOSE, CALCIUM in the last 168 hours. Lab Results  Component Value Date   INR 0.95 01/21/2017     Recent Radiographic Studies :  No results found.  ASSESSMENT:  Displaced left femoral neck fracture s/p pinning   PLAN:  In order to walk again Thurston Holenne needs conversion to hemi hip.  Planning surgery tomorrow morning.  Went over risks of anesthesia and infection but no good alternative.  Elaiza Shoberg G 03/10/2017, 3:58 PM

## 2017-03-10 NOTE — Progress Notes (Signed)
Spoke with Rolly SalterHaley in the ED, pt to be admitted.

## 2017-03-10 NOTE — Progress Notes (Signed)
Pt is NPO after midnight;for surgery in am; son informed.Pt is also uncomfortable with external tubes;even "irritated" with IV tubing on her arm but no signs of pulling it out.Also refused foley catheter insertion at this time.

## 2017-03-10 NOTE — ED Triage Notes (Signed)
The pt has fallen several times since she had a hip replacememt recently  She has pain in her lt hip and her lt knee.  She was sen t here for admission  And surgery in that lt hip and lt knee

## 2017-03-10 NOTE — H&P (Signed)
History and Physical    Jamie Carlson:[id number] DOB: 1918/02/13 DOA: 03/10/2017  PCP: No primary care provider on file.   Patient coming from: Orthopedic surgery office  Chief Complaint: Left hip pain  HPI: Jamie Carlson is a 83 y.o. woman with a history of HTN, severe protein calorie malnutrition (she is on a dysphagia 3 diet), mild aortic stenosis, grade 1 diastolic dysfunction who was admitted in March for left femoral neck fracture after having a mechanical fall.  She required cannulated hip pinning with Dr. Jones Broom.  She lives at home with her son, and apparently she has had at least two falls this week.  She started complaining of new pain in her left hip.  She was seen in ortho clinic today where imaging revealed a displaced femoral neck fracture.  She was referred to the ED for admission because she will need to go back to the OR for hemiarthroplasty.  No known LOC or syncope.  No dysuria.  No chest pain or shortness of breath.  ED Course: Hip pain controlled with acetaminophen.  WBC count 14.7.  BUN 30.  Creatinine 0.62.  Chest xray shows aortic atherosclerosis, left basilar atelectasis.  Left hip xray shows new superolateral displacement of the left femoral neck and shaft relative to the femoral head with the tips of 3 cannulated screws no longer embedded within the left femoral head.  Hospitalist asked to admit.  Review of Systems: As per HPI otherwise 10 systems reviewed and negative.   Past Medical History:  Diagnosis Date  . Cardiac murmur   . Closed subcapital fracture of femur, left, initial encounter (HCC)   . Hypertension   . Hypertensive urgency   . Hypokalemia   . Protein-calorie malnutrition, severe (HCC)   . Urinary tract infection     Past Surgical History:  Procedure Laterality Date  . CESAREAN SECTION    . HIP PINNING,CANNULATED Left 01/22/2017   Procedure: CANNULATED HIP PINNING;  Surgeon: Jones Broom, MD;  Location: [facility] OR;  Service:  Orthopedics;  Laterality: Left;     reports that she has never smoked. She has never used smokeless tobacco. She reports that she does not drink alcohol or use drugs.  She lives with her son, who is her next of kin.  She is a widow.  She ambulates with a cane (intermittently) at baseline.  Allergies  Allergen Reactions  . Sulfa Antibiotics     unknown  . Bee Venom Rash    Family History  Problem Relation Age of Onset  . Family history unknown: Yes  Patient's son denies any known conditions that run in the family.  He reports that relatives of the patient typically lived into their 60's before dying.   Prior to Admission medications   Medication Sig Start Date End Date Taking? Authorizing Provider  acetaminophen (TYLENOL) 325 MG tablet Take 650 mg by mouth every 4 (four) hours as needed for mild pain or moderate pain.     [provider]  aspirin EC 81 MG tablet Take 81 mg by mouth 2 (two) times daily.    [provider]  docusate sodium (COLACE) 100 MG capsule Take 100 mg by mouth daily.    [provider]  NUTRITIONAL SUPPLEMENT LIQD Take 1 each by mouth daily. Magic Cup    [provider]  polyethylene glycol (MIRALAX / GLYCOLAX) packet Take 17 g by mouth daily as needed for mild constipation. 01/25/17   Osvaldo Shipper, MD  Psyllium (  METAMUCIL) 28.3 % POWD Take 1 scoop by mouth. 2 scoops BID every other day, on days not taking senna.    [provider]  sennosides-docusate sodium (SENOKOT-S) 8.6-50 MG tablet Take 1 tablet by mouth every other day.    [provider]  UNABLE TO FIND Med Name: Med pass 120 mL by mouth daily    [provider]    Physical Exam: Vitals:   03/10/17 1712 03/10/17 1800 03/10/17 1815 03/10/17 1845  BP: (!) 165/93     Pulse: 86     Resp: 18 18 19 16   Temp:      TempSrc:      SpO2: 100%     Weight:      Height:          Constitutional: NAD, calm, comfortable, extremely hard of  hearing but she is not acutely decompensating Vitals:   03/10/17 1712 03/10/17 1800 03/10/17 1815 03/10/17 1845  BP: (!) 165/93     Pulse: 86     Resp: 18 18 19 16   Temp:      TempSrc:      SpO2: 100%     Weight:      Height:       Eyes: PERRL, lids and conjunctivae normal ENMT: Mucous membranes are dry.  Posterior pharynx not completely visualized. Edentulous. Neck: normal appearance, supple, no masses Respiratory: clear to auscultation listening anteriorly.  No wheezing.  Normal respiratory effort. No accessory muscle use.  Cardiovascular: Normal rate, regular rhythm, + murmur.  No extremity edema. 2+ pedal pulses. GI: abdomen is soft and compressible.  No distention.  No tenderness.  Bowel sounds are present. Musculoskeletal:  No joint deformity in upper extremities. Reduced ROM in LLE.  No contractures. Normal muscle tone.  Skin: no rashes, warm and dry Neurologic: Moves all four extremities spontaneously.  No apparent focal deficits.  Psychiatric: Oriented to person and place.  Mood appropriate.      Labs on Admission: I have personally reviewed following labs and imaging studies  CBC:  Recent Labs Lab 03/10/17 1622  WBC 14.7*  HGB 12.4  HCT 38.6  MCV 93.0  PLT 303   Basic Metabolic Panel:  Recent Labs Lab 03/10/17 1622  NA 140  K 3.8  CL 98*  CO2 31  GLUCOSE 124*  BUN 30*  CREATININE 0.62  CALCIUM 9.3   GFR: Estimated Creatinine Clearance: 21.2 mL/min (by C-G formula based on SCr of 0.62 mg/dL). Liver Function Tests:  Recent Labs Lab 03/10/17 1622  AST 26  ALT 16  ALKPHOS 86  BILITOT 0.8  PROT 6.7  ALBUMIN 3.5   Urine analysis: Pending  Radiological Exams on Admission: Dg Chest 1 View  Result Date: 03/10/2017 CLINICAL DATA:  Pain after fall.  Preop. EXAM: CHEST 1 VIEW COMPARISON:  01/21/2017 FINDINGS: Heart size is within normal limits. There is aortic atherosclerosis. There is atelectasis at the left lung base. No overt pulmonary edema,  pneumothorax nor acute osseous abnormality of the bony thorax. Degenerative changes are seen along the thoracic spine and both shoulders. IMPRESSION: Aortic atherosclerosis. Left basilar atelectasis. No acute pneumonic consolidation. Electronically Signed   By: Tollie Ethavid  Kwon M.D.   On: 03/10/2017 18:56   Dg Tibia/fibula Right  Result Date: 03/10/2017 CLINICAL DATA:  Right leg pain after fall EXAM: RIGHT TIBIA AND FIBULA - 2 VIEW COMPARISON:  None. FINDINGS: Bones appear osteopenic. No acute displaced fracture is noted. There is no joint dislocation. Soft tissue induration is seen  along the anterior aspect of the proximal tibial shaft. IMPRESSION: Soft tissue abrasion along the anterior aspect of the proximal leg. No underlying fracture. Electronically Signed   By: Tollie Eth M.D.   On: 03/10/2017 19:02   Dg Hip Unilat W Or Wo Pelvis 1 View Left  Result Date: 03/10/2017 CLINICAL DATA:  Left hip pain after fall EXAM: DG HIP (WITH OR WITHOUT PELVIS) 1V*L* COMPARISON:  01/22/2017 FINDINGS: Acute superolateral displacement of the left femoral neck and shaft relative to the femoral head since the intraoperative left femoral neck repair on 01/22/2017. Three cannulated screws are no longer imbedded within the femoral head. A true lateral view was unable to be provided to determine whether the displacement is anterior or posterior relative to the femoral head. The bony pelvis is grossly intact. No pubic rami fractures. IMPRESSION: New superolateral displacement of the left femoral neck and shaft relative to the femoral head with the tips of 3 cannulated screws no longer embedded within the left femoral head. No bony pelvic fracture is seen. Electronically Signed   By: Tollie Eth M.D.   On: 03/10/2017 19:00    EKG: Reportedly NSR but I cannot see the images.  Repeat EKG requested.  Assessment/Plan Principal Problem:   Hip fracture (HCC) Active Problems:   Essential hypertension   Cardiac murmur   Fall    Protein-calorie malnutrition, severe   Leukocytosis      Acute left femoral neck fracture after recurrent fall.  Anticipate OR for hemiarthroplasty in the AM --NPO after midnight --SCDs for now, post-op DVT prophylaxis per Ortho consultant --NS at 100cc/hr for one liter then saline lock --Analgesics as needed.  Pain currently controlled with acetaminophen alone. --Pre-op urinalysis still pending (ordered in the ED)  History of constipation --Continue her home bowel regimen  History of HTN, does not appear to be on any anti-hypertensives at home --Monitor blood pressure, may need an IV agent prn  History of mild AS, chronic diastolic heart failure --Compensated  History of severe protein calorie malnutrition --Nutrition consult --Dysphagia 3 diet recommended with last admission  Leukocytosis --Likely acute phase reactant in the setting of acute fracture  DVT prophylaxis: SCDs Code Status: DNR Family Communication: Son at bedside in the ED at time of admission. Disposition Plan: To be determined. Consults called: Orthopedic surgery Admission status: Inpatient, med surg.  I expect the patient will need inpatient services for greater than two midnights.   TIME SPENT: 60 minutes   Jerene Bears MD [facility] Pager [phone]  If 7PM-7AM, please contact night-coverage [email or web address] Password [email or web address]  03/10/2017, 7:45 PM

## 2017-03-10 NOTE — ED Notes (Signed)
Admitting Provider at bedside. 

## 2017-03-10 NOTE — ED Notes (Signed)
Patient returned from xray. Unable to update vital signs. Patient refusing to put on monitoring devices.

## 2017-03-10 NOTE — ED Notes (Signed)
Pt. Refusing to wear monitoring devices.

## 2017-03-10 NOTE — ED Provider Notes (Signed)
[facility]-EMERGENCY DEPT Provider Note   CSN: [id number] Arrival date & time: 03/10/17  1451     History   Chief Complaint Chief Complaint  Patient presents with  . Hip Pain    HPI Jamie Carlson is a 83 y.o. female.  Patient presents with family member for left hip pain. Patient had left hip surgery/replacement early March. Patient had a few falls since recently and had an x-ray that showed fracture/changes in left hip that needs surgery. Patient had surgery by [facility] Ave Filterhandler. Patient takes Tylenol typically for pain. Patient has a history of high blood pressure, malnutrition and urine infection.      Past Medical History:  Diagnosis Date  . Cardiac murmur   . Closed subcapital fracture of femur, left, initial encounter (HCC)   . Hypertension   . Hypertensive urgency   . Hypokalemia   . Protein-calorie malnutrition, severe (HCC)   . Urinary tract infection     Patient Active Problem List   Diagnosis Date Noted  . Constipation 01/26/2017  . Protein-calorie malnutrition, severe 01/24/2017  . Essential hypertension 01/22/2017  . Hypertensive urgency 01/22/2017  . Hypokalemia 01/22/2017  . Closed subcapital fracture of femur, left, initial encounter (HCC) 01/22/2017  . Cardiac murmur 01/22/2017  . Cystitis   . Fall     Past Surgical History:  Procedure Laterality Date  . CESAREAN SECTION    . HIP PINNING,CANNULATED Left 01/22/2017   Procedure: CANNULATED HIP PINNING;  Surgeon: Jones BroomJustin Chandler, MD;  Location: [facility] OR;  Service: Orthopedics;  Laterality: Left;    OB History    No data available       Home Medications    Prior to Admission medications   Medication Sig Start Date End Date Taking? Authorizing Provider  acetaminophen (TYLENOL) 325 MG tablet Take 650 mg by mouth every 4 (four) hours as needed for mild pain or moderate pain.     Historical Provider, MD  aspirin EC 81 MG tablet Take 81 mg by mouth 2 (two) times daily.    Historical Provider, MD    docusate sodium (COLACE) 100 MG capsule Take 100 mg by mouth daily.    Historical Provider, MD  NUTRITIONAL SUPPLEMENT LIQD Take 1 each by mouth daily. Magic Cup    Historical Provider, MD  polyethylene glycol (MIRALAX / GLYCOLAX) packet Take 17 g by mouth daily as needed for mild constipation. 01/25/17   Osvaldo ShipperGokul Krishnan, MD  Psyllium (METAMUCIL) 28.3 % POWD Take 1 scoop by mouth. 2 scoops BID every other day, on days not taking senna.    Historical Provider, MD  sennosides-docusate sodium (SENOKOT-S) 8.6-50 MG tablet Take 1 tablet by mouth every other day.    Historical Provider, MD  UNABLE TO FIND Med Name: Med pass 120 mL by mouth daily    Historical Provider, MD    Family History Family History  Problem Relation Age of Onset  . Family history unknown: Yes    Social History Social History  Substance Use Topics  . Smoking status: Never Smoker  . Smokeless tobacco: Never Used  . Alcohol use No     Allergies   Sulfa antibiotics and Bee venom   Review of Systems Review of Systems  Constitutional: Negative for chills and fever.  HENT: Negative for congestion.   Eyes: Negative for visual disturbance.  Respiratory: Negative for shortness of breath.   Cardiovascular: Negative for chest pain.  Gastrointestinal: Negative for abdominal pain and vomiting.  Genitourinary: Negative for dysuria and flank  pain.  Musculoskeletal: Positive for gait problem. Negative for back pain, neck pain and neck stiffness.  Skin: Negative for rash.  Neurological: Negative for light-headedness and headaches.     Physical Exam Updated Vital Signs BP (!) 165/93 (BP Location: Right Arm)   Pulse 86   Temp 98 F (36.7 C) (Oral)   Resp 16   Ht 5' (1.524 m)   Wt 72 lb (32.7 kg)   SpO2 100%   BMI 14.06 kg/m   Physical Exam  Constitutional: She is oriented to person, place, and time. She appears well-developed.  HENT:  Head: Normocephalic and atraumatic.  Eyes: Right eye exhibits no discharge.  Left eye exhibits no discharge.  Neck: Normal range of motion. Neck supple. No tracheal deviation present.  Cardiovascular: Normal rate and regular rhythm.   Pulmonary/Chest: Effort normal and breath sounds normal.  Abdominal: Soft. She exhibits no distension. There is no tenderness. There is no guarding.  Musculoskeletal: She exhibits tenderness. She exhibits no edema.  Neck full rom, no midline tenderness Patient has tenderness with flexion and palpation of left hip. Mild shortening of the left leg. Patient has superficial wound in the right anterior tibia without warmth or streaking. Compartment soft.  Neurological: She is alert and oriented to person, place, and time.  Blind, hard of hearing.  decr rom left hip due to pain perrl  Skin: Skin is warm. No pallor.  Psychiatric: She has a normal mood and affect.  Nursing note and vitals reviewed.    ED Treatments / Results  Labs (all labs ordered are listed, but only abnormal results are displayed) Labs Reviewed  COMPREHENSIVE METABOLIC PANEL - Abnormal; Notable for the following:       Result Value   Chloride 98 (*)    Glucose, Bld 124 (*)    BUN 30 (*)    All other components within normal limits  CBC - Abnormal; Notable for the following:    WBC 14.7 (*)    All other components within normal limits  URINALYSIS, ROUTINE W REFLEX MICROSCOPIC    EKG  EKG Interpretation None       Radiology Dg Chest 1 View  Result Date: 03/10/2017 CLINICAL DATA:  Pain after fall.  Preop. EXAM: CHEST 1 VIEW COMPARISON:  01/21/2017 FINDINGS: Heart size is within normal limits. There is aortic atherosclerosis. There is atelectasis at the left lung base. No overt pulmonary edema, pneumothorax nor acute osseous abnormality of the bony thorax. Degenerative changes are seen along the thoracic spine and both shoulders. IMPRESSION: Aortic atherosclerosis. Left basilar atelectasis. No acute pneumonic consolidation. Electronically Signed   By: Tollie Eth M.D.   On: 03/10/2017 18:56   Dg Tibia/fibula Right  Result Date: 03/10/2017 CLINICAL DATA:  Right leg pain after fall EXAM: RIGHT TIBIA AND FIBULA - 2 VIEW COMPARISON:  None. FINDINGS: Bones appear osteopenic. No acute displaced fracture is noted. There is no joint dislocation. Soft tissue induration is seen along the anterior aspect of the proximal tibial shaft. IMPRESSION: Soft tissue abrasion along the anterior aspect of the proximal leg. No underlying fracture. Electronically Signed   By: Tollie Eth M.D.   On: 03/10/2017 19:02   Dg Hip Unilat W Or Wo Pelvis 1 View Left  Result Date: 03/10/2017 CLINICAL DATA:  Left hip pain after fall EXAM: DG HIP (WITH OR WITHOUT PELVIS) 1V*L* COMPARISON:  01/22/2017 FINDINGS: Acute superolateral displacement of the left femoral neck and shaft relative to the femoral head since the intraoperative left  femoral neck repair on 01/22/2017. Three cannulated screws are no longer imbedded within the femoral head. A true lateral view was unable to be provided to determine whether the displacement is anterior or posterior relative to the femoral head. The bony pelvis is grossly intact. No pubic rami fractures. IMPRESSION: New superolateral displacement of the left femoral neck and shaft relative to the femoral head with the tips of 3 cannulated screws no longer embedded within the left femoral head. No bony pelvic fracture is seen. Electronically Signed   By: Tollie Eth M.D.   On: 03/10/2017 19:00    Procedures Procedures (including critical care time)  Medications Ordered in ED Medications  acetaminophen (TYLENOL) solution 1,000 mg (1,000 mg Oral Given 03/10/17 1746)     Initial Impression / Assessment and Plan / ED Course  I have reviewed the triage vital signs and the nursing notes.  Pertinent labs & imaging results that were available during my care of the patient were reviewed by me and considered in my medical decision making (see chart for details).      Patient presents after recent falls since left hip surgery. Clinical and recent x-ray per family showed fracture. Plan for chest x-ray hip x-ray, preop blood work in admission to the hospitalist. Patient has an Psychologist, clinical.  The patients results and plan were reviewed and discussed.   Any x-rays performed were independently reviewed by myself.   Differential diagnosis were considered with the presenting HPI.  Medications  acetaminophen (TYLENOL) solution 1,000 mg (1,000 mg Oral Given 03/10/17 1746)    Vitals:   03/10/17 1712 03/10/17 1800 03/10/17 1815 03/10/17 1845  BP: (!) 165/93     Pulse: 86     Resp: 18 18 19 16   Temp:      TempSrc:      SpO2: 100%     Weight:      Height:        Final diagnoses:  Preop examination  Closed left hip fracture, initial encounter (HCC)  Fall, initial encounter    Admission/ observation were discussed with the admitting physician, patient and/or family and they are comfortable with the plan.    Final Clinical Impressions(s) / ED Diagnoses   Final diagnoses:  Preop examination  Closed left hip fracture, initial encounter [facility])  Fall, initial encounter    New Prescriptions New Prescriptions   No medications on file     Blane Ohara, MD 03/10/17 1907

## 2017-03-11 ENCOUNTER — Inpatient Hospital Stay (HOSPITAL_COMMUNITY): Payer: Medicare Other

## 2017-03-11 ENCOUNTER — Inpatient Hospital Stay (HOSPITAL_COMMUNITY): Payer: Medicare Other | Admitting: Anesthesiology

## 2017-03-11 ENCOUNTER — Inpatient Hospital Stay (HOSPITAL_COMMUNITY): Admission: RE | Admit: 2017-03-11 | Payer: Medicare Other | Source: Ambulatory Visit | Admitting: Orthopaedic Surgery

## 2017-03-11 ENCOUNTER — Encounter (HOSPITAL_COMMUNITY)
Admission: EM | Disposition: A | Discharge status: Skilled Nursing Facility | Payer: Self-pay | Source: Home / Self Care | Attending: Internal Medicine

## 2017-03-11 DIAGNOSIS — I10 Essential (primary) hypertension: Secondary | ICD-10-CM

## 2017-03-11 DIAGNOSIS — R636 Underweight: Secondary | ICD-10-CM

## 2017-03-11 DIAGNOSIS — F0390 Unspecified dementia without behavioral disturbance: Secondary | ICD-10-CM

## 2017-03-11 HISTORY — PX: HIP ARTHROPLASTY: SHX981

## 2017-03-11 HISTORY — PX: HARDWARE REMOVAL: SHX979

## 2017-03-11 LAB — PROTIME-INR
INR: 1.05
Prothrombin Time: 13.7 seconds (ref 11.4–15.2)

## 2017-03-11 LAB — CBC
HEMATOCRIT: 36.4 % (ref 36.0–46.0)
HEMOGLOBIN: 11.2 g/dL — AB (ref 12.0–15.0)
MCH: 28.8 pg (ref 26.0–34.0)
MCHC: 30.8 g/dL (ref 30.0–36.0)
MCV: 93.6 fL (ref 78.0–100.0)
Platelets: 252 10*3/uL (ref 150–400)
RBC: 3.89 MIL/uL (ref 3.87–5.11)
RDW: 13.1 % (ref 11.5–15.5)
WBC: 7.8 10*3/uL (ref 4.0–10.5)

## 2017-03-11 LAB — SURGICAL PCR SCREEN
MRSA, PCR: POSITIVE — AB
STAPHYLOCOCCUS AUREUS: POSITIVE — AB

## 2017-03-11 LAB — BASIC METABOLIC PANEL
ANION GAP: 8 (ref 5–15)
BUN: 27 mg/dL — AB (ref 6–20)
CO2: 30 mmol/L (ref 22–32)
Calcium: 8.6 mg/dL — ABNORMAL LOW (ref 8.9–10.3)
Chloride: 102 mmol/L (ref 101–111)
Creatinine, Ser: 0.56 mg/dL (ref 0.44–1.00)
GFR calc Af Amer: >60 mL/min (ref >60)
GFR calc non Af Amer: >60 mL/min (ref >60)
GLUCOSE: 94 mg/dL (ref 65–99)
Potassium: 3.8 mmol/L (ref 3.5–5.1)
Sodium: 140 mmol/L (ref 135–145)

## 2017-03-11 LAB — TYPE AND SCREEN
ABO/RH(D): O NEG
Antibody Screen: NEGATIVE

## 2017-03-11 SURGERY — HEMIARTHROPLASTY, HIP, DIRECT ANTERIOR APPROACH, FOR FRACTURE
Anesthesia: Spinal | Site: Hip | Laterality: Left

## 2017-03-11 MED ORDER — ONDANSETRON HCL 4 MG/2ML IJ SOLN: 4.0000 mg | Freq: Four times a day (QID) | INTRAMUSCULAR | Status: DC | PRN

## 2017-03-11 MED ORDER — POLYETHYLENE GLYCOL 3350 17 G PO PACK
17.0000 g | PACK | Freq: Every day | ORAL | Status: DC
  Administered 2017-03-11 – 2017-03-13 (×3): 17 g via ORAL
  Filled 2017-03-11 (×3): qty 1

## 2017-03-11 MED ORDER — ONDANSETRON HCL 4 MG/2ML IJ SOLN: 4.0000 mg | Freq: Once | INTRAMUSCULAR | Status: DC | PRN

## 2017-03-11 MED ORDER — ACETAMINOPHEN 650 MG RE SUPP: 650.0000 mg | Freq: Four times a day (QID) | RECTAL | Status: DC | PRN

## 2017-03-11 MED ORDER — TRANEXAMIC ACID 1000 MG/10ML IV SOLN
2000.0000 mg | Freq: Once | INTRAVENOUS | Status: DC
  Filled 2017-03-11: qty 20

## 2017-03-11 MED ORDER — LACTATED RINGERS IV SOLN
INTRAVENOUS | Status: DC | PRN
  Administered 2017-03-11 (×2): via INTRAVENOUS

## 2017-03-11 MED ORDER — METOCLOPRAMIDE HCL 5 MG/ML IJ SOLN: 5.0000 mg | Freq: Three times a day (TID) | INTRAMUSCULAR | Status: DC | PRN

## 2017-03-11 MED ORDER — CEFAZOLIN SODIUM-DEXTROSE 2-3 GM-% IV SOLR
INTRAVENOUS | Status: DC | PRN
  Administered 2017-03-11: 2 g via INTRAVENOUS

## 2017-03-11 MED ORDER — PROPOFOL 10 MG/ML IV BOLUS
INTRAVENOUS | Status: DC | PRN
  Administered 2017-03-11: 10 mg via INTRAVENOUS
  Administered 2017-03-11: 20 mg via INTRAVENOUS

## 2017-03-11 MED ORDER — CEFAZOLIN SODIUM-DEXTROSE 2-4 GM/100ML-% IV SOLN
2.0000 g | Freq: Four times a day (QID) | INTRAVENOUS | Status: AC
  Administered 2017-03-11 (×2): 2 g via INTRAVENOUS
  Filled 2017-03-11 (×2): qty 100

## 2017-03-11 MED ORDER — MENTHOL 3 MG MT LOZG: 1.0000 | LOZENGE | OROMUCOSAL | Status: DC | PRN

## 2017-03-11 MED ORDER — ACETAMINOPHEN 325 MG PO TABS: 650.0000 mg | ORAL_TABLET | Freq: Four times a day (QID) | ORAL | Status: DC | PRN

## 2017-03-11 MED ORDER — TRANEXAMIC ACID 1000 MG/10ML IV SOLN
500.0000 mg | Freq: Once | INTRAVENOUS | Status: AC
  Administered 2017-03-11: 500 mg via INTRAVENOUS
  Filled 2017-03-11: qty 5

## 2017-03-11 MED ORDER — CHLORHEXIDINE GLUCONATE CLOTH 2 % EX PADS
6.0000 | MEDICATED_PAD | Freq: Every day | CUTANEOUS | Status: DC
  Administered 2017-03-11 – 2017-03-13 (×3): 6 via TOPICAL

## 2017-03-11 MED ORDER — METOCLOPRAMIDE HCL 5 MG PO TABS: 5.0000 mg | ORAL_TABLET | Freq: Three times a day (TID) | ORAL | Status: DC | PRN

## 2017-03-11 MED ORDER — FENTANYL CITRATE (PF) 100 MCG/2ML IJ SOLN
INTRAMUSCULAR | Status: AC
  Filled 2017-03-11: qty 2

## 2017-03-11 MED ORDER — PHENYLEPHRINE HCL 10 MG/ML IJ SOLN
INTRAMUSCULAR | Status: DC | PRN
  Administered 2017-03-11: 120 ug via INTRAVENOUS
  Administered 2017-03-11: 80 ug via INTRAVENOUS

## 2017-03-11 MED ORDER — ACETAMINOPHEN 160 MG/5ML PO SOLN
1000.0000 mg | Freq: Three times a day (TID) | ORAL | Status: DC
  Administered 2017-03-11: 1000 mg via ORAL
  Administered 2017-03-12: 650 mg via ORAL
  Administered 2017-03-12 – 2017-03-13 (×2): 1000 mg via ORAL
  Filled 2017-03-11 (×4): qty 40.6

## 2017-03-11 MED ORDER — MUPIROCIN 2 % EX OINT
1.0000 "application " | TOPICAL_OINTMENT | Freq: Two times a day (BID) | CUTANEOUS | Status: DC
  Administered 2017-03-11 – 2017-03-13 (×4): 1 via NASAL
  Filled 2017-03-11: qty 22

## 2017-03-11 MED ORDER — SODIUM CHLORIDE 0.9 % IR SOLN
Status: DC | PRN
  Administered 2017-03-11: 3000 mL

## 2017-03-11 MED ORDER — ENSURE ENLIVE PO LIQD
237.0000 mL | Freq: Three times a day (TID) | ORAL | Status: DC
  Administered 2017-03-11 – 2017-03-13 (×5): 237 mL via ORAL

## 2017-03-11 MED ORDER — FENTANYL CITRATE (PF) 250 MCG/5ML IJ SOLN
INTRAMUSCULAR | Status: AC
  Filled 2017-03-11: qty 5

## 2017-03-11 MED ORDER — BUPIVACAINE IN DEXTROSE 0.75-8.25 % IT SOLN
INTRATHECAL | Status: DC | PRN
  Administered 2017-03-11: 1.4 mL via INTRATHECAL

## 2017-03-11 MED ORDER — BUPIVACAINE LIPOSOME 1.3 % IJ SUSP
20.0000 mL | Freq: Once | INTRAMUSCULAR | Status: DC
  Filled 2017-03-11: qty 20

## 2017-03-11 MED ORDER — PHENOL 1.4 % MT LIQD: 1.0000 | OROMUCOSAL | Status: DC | PRN

## 2017-03-11 MED ORDER — PHENYLEPHRINE HCL 10 MG/ML IJ SOLN
INTRAMUSCULAR | Status: DC | PRN
  Administered 2017-03-11: 20 ug/min via INTRAVENOUS

## 2017-03-11 MED ORDER — HYDROCODONE-ACETAMINOPHEN 5-325 MG PO TABS: 1.0000 | ORAL_TABLET | Freq: Four times a day (QID) | ORAL | Status: DC | PRN

## 2017-03-11 MED ORDER — BUPIVACAINE LIPOSOME 1.3 % IJ SUSP
INTRAMUSCULAR | Status: DC | PRN
  Administered 2017-03-11: 20 mL

## 2017-03-11 MED ORDER — PROPOFOL 10 MG/ML IV BOLUS
INTRAVENOUS | Status: AC
  Filled 2017-03-11: qty 20

## 2017-03-11 MED ORDER — ONDANSETRON HCL 4 MG PO TABS: 4.0000 mg | ORAL_TABLET | Freq: Four times a day (QID) | ORAL | Status: DC | PRN

## 2017-03-11 MED ORDER — PROPOFOL 500 MG/50ML IV EMUL
INTRAVENOUS | Status: DC | PRN
  Administered 2017-03-11: 20 ug/kg/min via INTRAVENOUS

## 2017-03-11 MED ORDER — FENTANYL CITRATE (PF) 100 MCG/2ML IJ SOLN
25.0000 ug | INTRAMUSCULAR | Status: DC | PRN
  Administered 2017-03-11: 25 ug via INTRAVENOUS

## 2017-03-11 MED ORDER — AMLODIPINE BESYLATE 2.5 MG PO TABS
2.5000 mg | ORAL_TABLET | Freq: Every day | ORAL | Status: DC
  Administered 2017-03-11 – 2017-03-12 (×2): 2.5 mg via ORAL
  Filled 2017-03-11 (×2): qty 1

## 2017-03-11 SURGICAL SUPPLY — 67 items
BLADE CLIPPER SURG (BLADE) IMPLANT
BLADE SAW SAG 73X25 THK (BLADE) ×2
BLADE SAW SGTL 73X25 THK (BLADE) ×2 IMPLANT
BRUSH FEMORAL CANAL (MISCELLANEOUS) IMPLANT
CAPT HIP HEMI 1 ×2 IMPLANT
CEMENT HV SMART SET (Cement) ×4 IMPLANT
CEMENT RESTRICTOR DEPUY SZ 4 (Cement) ×2 IMPLANT
CLOSURE WOUND 1/2 X4 (GAUZE/BANDAGES/DRESSINGS) ×1
COVER SURGICAL LIGHT HANDLE (MISCELLANEOUS) ×4 IMPLANT
DRAPE HALF SHEET 40X57 (DRAPES) ×4 IMPLANT
DRAPE IMP U-DRAPE 54X76 (DRAPES) ×4 IMPLANT
DRAPE ORTHO SPLIT 77X108 STRL (DRAPES) ×8
DRAPE SURG ORHT 6 SPLT 77X108 (DRAPES) ×4 IMPLANT
DRAPE U-SHAPE 47X51 STRL (DRAPES) ×4 IMPLANT
DRILL BIT 7/64X5 (BIT) ×4 IMPLANT
DRSG ADAPTIC 3X8 NADH LF (GAUZE/BANDAGES/DRESSINGS) ×4 IMPLANT
DRSG AQUACEL AG ADV 3.5X10 (GAUZE/BANDAGES/DRESSINGS) ×2 IMPLANT
DRSG PAD ABDOMINAL 8X10 ST (GAUZE/BANDAGES/DRESSINGS) ×8 IMPLANT
DURAPREP 26ML APPLICATOR (WOUND CARE) ×4 IMPLANT
ELECT BLADE 6.5 EXT (BLADE) IMPLANT
ELECT REM PT RETURN 9FT ADLT (ELECTROSURGICAL) ×4
ELECTRODE REM PT RTRN 9FT ADLT (ELECTROSURGICAL) ×2 IMPLANT
EVACUATOR 1/8 PVC DRAIN (DRAIN) IMPLANT
GAUZE SPONGE 4X4 12PLY STRL (GAUZE/BANDAGES/DRESSINGS) ×4 IMPLANT
GLOVE BIO SURGEON STRL SZ8 (GLOVE) ×16 IMPLANT
GLOVE BIOGEL PI IND STRL 8 (GLOVE) ×4 IMPLANT
GLOVE BIOGEL PI INDICATOR 8 (GLOVE) ×4
GLOVE BIOGEL PI ORTHO PRO 7.5 (GLOVE) ×4
GLOVE PI ORTHO PRO STRL 7.5 (GLOVE) ×4 IMPLANT
GOWN STRL REUS W/ TWL LRG LVL3 (GOWN DISPOSABLE) ×2 IMPLANT
GOWN STRL REUS W/ TWL XL LVL3 (GOWN DISPOSABLE) ×6 IMPLANT
GOWN STRL REUS W/TWL LRG LVL3 (GOWN DISPOSABLE) ×4
GOWN STRL REUS W/TWL XL LVL3 (GOWN DISPOSABLE) ×12
HANDPIECE INTERPULSE COAX TIP (DISPOSABLE)
IMMOBILIZER KNEE 20 (SOFTGOODS) ×4 IMPLANT
IMMOBILIZER KNEE 20 THIGH 36 (SOFTGOODS) IMPLANT
IMMOBILIZER KNEE 22 UNIV (SOFTGOODS) IMPLANT
IMMOBILIZER KNEE 24 THIGH 36 (MISCELLANEOUS) IMPLANT
IMMOBILIZER KNEE 24 UNIV (MISCELLANEOUS)
KIT BASIN OR (CUSTOM PROCEDURE TRAY) ×4 IMPLANT
KIT ROOM TURNOVER OR (KITS) ×4 IMPLANT
MANIFOLD NEPTUNE II (INSTRUMENTS) ×4 IMPLANT
NDL 1/2 CIR MAYO (NEEDLE) IMPLANT
NEEDLE 1/2 CIR MAYO (NEEDLE) IMPLANT
NS IRRIG 1000ML POUR BTL (IV SOLUTION) ×4 IMPLANT
PACK TOTAL JOINT (CUSTOM PROCEDURE TRAY) ×4 IMPLANT
PACK UNIVERSAL I (CUSTOM PROCEDURE TRAY) ×4 IMPLANT
PAD ARMBOARD 7.5X6 YLW CONV (MISCELLANEOUS) ×8 IMPLANT
PASSER SUT SWANSON 36MM LOOP (INSTRUMENTS) IMPLANT
SET HNDPC FAN SPRY TIP SCT (DISPOSABLE) IMPLANT
STAPLER VISISTAT 35W (STAPLE) ×4 IMPLANT
STRIP CLOSURE SKIN 1/2X4 (GAUZE/BANDAGES/DRESSINGS) ×1 IMPLANT
SUCTION FRAZIER HANDLE 10FR (MISCELLANEOUS) ×2
SUCTION TUBE FRAZIER 10FR DISP (MISCELLANEOUS) ×2 IMPLANT
SUT ETHIBOND NAB CT1 #1 30IN (SUTURE) ×6 IMPLANT
SUT VIC AB 0 CT1 27 (SUTURE) ×4
SUT VIC AB 0 CT1 27XBRD ANBCTR (SUTURE) ×2 IMPLANT
SUT VIC AB 1 CTB1 27 (SUTURE) ×12 IMPLANT
SUT VIC AB 2-0 CT1 27 (SUTURE) ×4
SUT VIC AB 2-0 CT1 TAPERPNT 27 (SUTURE) ×2 IMPLANT
SUT VIC AB 3-0 SH 27 (SUTURE) ×4
SUT VIC AB 3-0 SH 27X BRD (SUTURE) IMPLANT
TOWEL OR 17X24 6PK STRL BLUE (TOWEL DISPOSABLE) ×4 IMPLANT
TOWEL OR 17X26 10 PK STRL BLUE (TOWEL DISPOSABLE) ×4 IMPLANT
TOWER CARTRIDGE SMART MIX (DISPOSABLE) IMPLANT
TRAY FOLEY W/METER SILVER 16FR (SET/KITS/TRAYS/PACK) IMPLANT
WATER STERILE IRR 1000ML POUR (IV SOLUTION) ×16 IMPLANT

## 2017-03-11 NOTE — Op Note (Addendum)
PRE-OP DIAGNOSIS:  LEFT HIP FRACTURE POST-OP DIAGNOSIS:  LEFT HIP FRACTURE PROCEDURE: LEFT HIP HEMIARTHROPLASTY AND HARDWARE REMOVAL POSTERIOR APPROACH ANESTHESIA:  spinal SURGEON:  Marcene Corning MD ASSISTANT:  Elodia Florence PA-C   INDICATIONS FOR PROCEDURE:  The patient is a 83 y.o. female with a recent history of a painful hip and x-rays which show a displaced femoral neck fracture. She had a pinning recently by Dr Ave Filter but fell again and broke the construct.  Hemiarthroplasty is offered as surgical treatment.  Informed operative consent was obtained after discussion of possible complications including reaction to anesthesia, infection, neurovascular injury, dislocation, DVT, PE, and death.  The importance of the postoperative rehab program to optimize result was stressed with the patient.  SUMMARY OF FINDINGS AND PROCEDURE:  Under spinal anesthesia through a standard posterior approach a hip hemiarthroplasty was performed.  The patient had no degenerative change and poor bone quality.  We used DePuy components to address the hip fracture and these were size 4 summet basic femur capped with a -3 42mm metal hip ball.  Elodia Florence PA-C assisted throughout and was invaluable to the completion of the case in that he helped position and retract while I performed the procedure.  He also closed simultaneously to help minimize OR time.  DESCRIPTION OF PROCEDURE:  The patient was taken to the OR suite where spinal anesthetic was applied.  The patient was then positioned in the lateral decubitus position with the operative hip up.  All bony prominences were appropriately padded, hip positioners were utilized, and an axillary roll was placed.  Prep and drape was then performed in normal sterile fashion.  The patient was given kefzol preoperative antibiotic and an appropriate time out was performed.  We then took a posterior approach to the right hip.  Dissection was taken through adipose to the IT band and  gluteus maximus fascia.  These structures were incised longitudinally to expose the short external rotators of the hip which were tagged and reflected. I easily located the three hip screws and they were removed with the appropriate screw driver.  A partial posterior capsulectomy was performed and a revision femoral neck cut was made.  A femoral neck cut was made below the fracture site and the femoral head was removed.  The femur was reamed and then broached to the appropriate size.  A trial reduction was done and the aforementioned head and neck assembly gave [place] the best stability in extension with external rotation and flexion with internal rotation.  Leg lengths were felt to be about equal.  The trial components were removed and the wound irrigated.  We elected to use a cemented femoral component.  A cement restrictor was placed and the canal irrigated.  Cement was mixed and pressurized into the canal.  We then placed the femoral component in appropriate anteversion.  One the cement hardened the head was applied to a dry stem neck and the hip again reduced.  It was again stable in the aforementioned positions.  The would was irrigated again followed by re-approximation of short external rotators to the greater trochanteric region.  IT band and gluteus maximum fascia were repaired with #1 vicryl followed by subcutaneous closure with #O and #2 undyed vicryl.  Skin was closed with subQ stitch followed by a sterile dressing.  EBL and IOF can be obtained from anesthesia records.  DISPOSITION:  The patient was extubated in the OR and taken to PACU in stable condition to be admitted back to the  medicine service  for appropriate post-op care to include perioperative antibiotics and DVT prophylaxis.

## 2017-03-11 NOTE — Progress Notes (Signed)
Showed up for IV team consult and patient refused IV restart.

## 2017-03-11 NOTE — H&P (View-Only) (Signed)
Marcene Corning, MD  Bryna Colander, PA-C  Elodia Florence, PA-C                                  [facility]                [address] [place], [place]  [address]   ORTHOPAEDIC CONSULTATION  Jamie Carlson            MRN:  [id number] DOB/SEX:  [birth year]female     CHIEF COMPLAINT:  Painful left leg  HISTORY: Jamie Carlson a 83 y.o. female with history of hip pinning by Dr Ave Filter this year.  Was doing great but fell recently and now hurting much more.  Difficulty walking now.  Seen with her son.   PAST MEDICAL HISTORY: Patient Active Problem List   Diagnosis Date Noted  . Constipation 01/26/2017  . Protein-calorie malnutrition, severe 01/24/2017  . Essential hypertension 01/22/2017  . Hypertensive urgency 01/22/2017  . Hypokalemia 01/22/2017  . Closed subcapital fracture of femur, left, initial encounter (HCC) 01/22/2017  . Cardiac murmur 01/22/2017  . Cystitis   . Fall    Past Medical History:  Diagnosis Date  . Cardiac murmur   . Closed subcapital fracture of femur, left, initial encounter (HCC)   . Hypertension   . Hypertensive urgency   . Hypokalemia   . Protein-calorie malnutrition, severe (HCC)   . Urinary tract infection    Past Surgical History:  Procedure Laterality Date  . CESAREAN SECTION    . HIP PINNING,CANNULATED Left 01/22/2017   Procedure: CANNULATED HIP PINNING;  Surgeon: Jones Broom, MD;  Location: [facility] OR;  Service: Orthopedics;  Laterality: Left;     MEDICATIONS:  No current facility-administered medications for this encounter.   Current Outpatient Prescriptions:  .  acetaminophen (TYLENOL) 325 MG tablet, Take 650 mg by mouth every 4 (four) hours as needed for mild pain or moderate pain. , Disp: , Rfl:  .  aspirin EC 81 MG tablet, Take 81 mg by mouth 2 (two) times daily., Disp: , Rfl:  .  docusate sodium (COLACE) 100 MG capsule, Take 100 mg by mouth daily., Disp: , Rfl:  .  NUTRITIONAL SUPPLEMENT LIQD, Take 1 each by mouth  daily. Magic Cup, Disp: , Rfl:  .  polyethylene glycol (MIRALAX / GLYCOLAX) packet, Take 17 Carlson by mouth daily as needed for mild constipation., Disp: 14 each, Rfl: 0 .  Psyllium (METAMUCIL) 28.3 % POWD, Take 1 scoop by mouth. 2 scoops BID every other day, on days not taking senna., Disp: , Rfl:  .  sennosides-docusate sodium (SENOKOT-S) 8.6-50 MG tablet, Take 1 tablet by mouth every other day., Disp: , Rfl:  .  UNABLE TO FIND, Med Name: Med pass 120 mL by mouth daily, Disp: , Rfl:   ALLERGIES:   Allergies  Allergen Reactions  . Sulfa Antibiotics     unknown  . Bee Venom Rash    REVIEW OF SYSTEMS: REVIEWED IN DETAIL IN CHART  FAMILY HISTORY:   Family History  Problem Relation Age of Onset  . Family history unknown: Yes    SOCIAL HISTORY:   Social History  Substance Use Topics  . Smoking status: Never Smoker  . Smokeless tobacco: Never Used  . Alcohol use No     EXAMINATION: Vital signs in last 24 hours: Temp:  [98 F (36.7 C)] 98 F (36.7 C) (05/04 1527) Pulse Rate:  [  88] 88 (05/04 1527) Resp:  [16] 16 (05/04 1527) BP: (150)/(90) 150/90 (05/04 1527) SpO2:  [97 %] 97 % (05/04 1527) Weight:  [32.7 kg (72 lb)] 32.7 kg (72 lb) (05/04 1540)  There were no vitals taken for this visit.  General Appearance:    Alert, cooperative, no distress, appears stated age  Head:    Normocephalic, without obvious abnormality, atraumatic  Eyes:    PERRL, conjunctiva/corneas clear, EOM's intact, fundi    benign, both eyes  Ears:    Normal TM's and external ear canals, both ears  Nose:   Nares normal, septum midline, mucosa normal, no drainage    or sinus tenderness  Throat:   Lips, mucosa, and tongue normal; teeth and gums normal  Neck:   Supple, symmetrical, trachea midline, no adenopathy;    thyroid:  no enlargement/tenderness/nodules; no carotid   bruit or JVD  Back:     Symmetric, no curvature, ROM normal, no CVA tenderness  Lungs:     Clear to auscultation bilaterally, respirations  unlabored  Chest Wall:    No tenderness or deformity   Heart:    Regular rate and rhythm, S1 and S2 normal, no murmur, rub   or gallop  Breast Exam:    No tenderness, masses, or nipple abnormality  Abdomen:     Soft, non-tender, bowel sounds active all four quadrants,    no masses, no organomegaly  Genitalia:    Rectal:    Extremities:   Extremities normal, atraumatic, no cyanosis or edema except left leg  Pulses:   2+ and symmetric all extremities  Skin:   Skin color, texture, turgor normal, no rashes or lesions  Lymph nodes:   Cervical, supraclavicular, and axillary nodes normal  Neurologic:   CNII-XII intact, normal strength, sensation and reflexes    throughout    Musculoskeletal Exam:   Left leg shot and painful to rotation   DIAGNOSTIC STUDIES: Recent laboratory studies: No results for input(s): WBC, HGB, HCT, PLT in the last 168 hours. No results for input(s): NA, K, CL, CO2, BUN, CREATININE, GLUCOSE, CALCIUM in the last 168 hours. Lab Results  Component Value Date   INR 0.95 01/21/2017     Recent Radiographic Studies :  No results found.  ASSESSMENT:  Displaced left femoral neck fracture s/p pinning   PLAN:  In order to walk again Jamie Carlson needs conversion to hemi hip.  Planning surgery tomorrow morning.  Went over risks of anesthesia and infection but no good alternative.  Jamie Carlson 03/10/2017, 3:58 PM

## 2017-03-11 NOTE — Anesthesia Procedure Notes (Signed)
Spinal  Patient location during procedure: OR Start time: 03/11/2017 7:37 AM End time: 03/11/2017 7:40 AM Staffing Anesthesiologist: Cecile HearingURK, [name] [name] Performed: anesthesiologist  Preanesthetic Checklist Completed: patient identified, surgical consent, pre-op evaluation, timeout performed, IV checked, risks and benefits discussed and monitors and equipment checked Spinal Block Patient position: left lateral decubitus Prep: site prepped and draped and DuraPrep Patient monitoring: continuous pulse ox and blood pressure Approach: midline Location: L3-4 Injection technique: single-shot Needle Needle type: Pencan  Needle gauge: 25 G Needle length: 9 cm Assessment Sensory level: T8 Additional Notes Functioning IV was confirmed and monitors were applied. Sterile prep and drape, including hand hygiene, mask and sterile gloves were used. The patient was positioned and the spine was prepped. The skin was anesthetized with lidocaine.  Free flow of clear CSF was obtained prior to injecting local anesthetic into the CSF.  The spinal needle aspirated freely following injection.  The needle was carefully withdrawn.  The patient tolerated the procedure well. Consent was obtained prior to procedure with all questions answered and concerns addressed. Risks including but not limited to bleeding, infection, nerve damage, paralysis, failed block, inadequate analgesia, allergic reaction, high spinal, itching and headache were discussed and the patient wished to proceed.   Jamie AranStephen Dyesha Henault, MD

## 2017-03-11 NOTE — Progress Notes (Signed)
Initial Nutrition Assessment  DOCUMENTATION CODES:  Underweight  INTERVENTION:  Ensure Enlive po TID, each supplement provides 350 kcal and 20 grams of protein  NUTRITION DIAGNOSIS:  Underweight related to Age related sarcopenia, impaired anabolism as evidenced by BMI <18.5.  GOAL:  Patient will meet greater than or equal to 90% of their needs  MONITOR:  PO intake, Supplement acceptance, Diet advancement, Weight trends  REASON FOR ASSESSMENT:  Consult Hip fracture protocol  ASSESSMENT:  83 y/o female PMHx HOH, Protein calorie malnutrition (on D3 diet), HTN, Grade 1 diastolic dysfunction and recent L hip fx s/p IM nailing in March. Recently presented to ortho clinic with new L hip pain. Found to have displaced hip fx. Now s/p L hemiarthroplasty.   Pt seen postop. She is very hard of hearing. She reports that at home, she was consuming a LIQUID diet. She does not have teeth and cannot chew. She would Drink 4 nutritional supplements per day. Depending on which supplement this was, these would provide 1000-1400 kcals/day, providing all of needs.   Bed wt today is 80 lbs. Per chart, she has maintained her weight since initial injury last month.   She has severe muscle/fat depletion, but in setting of wt maintenance and adequate PO intake, is not thought to be malnourished and wasting due to age related sarcopenia.   At this time, she does not have an appetite. Will order Ensure Enlive TID as she sounds to mostly subsist on supplements at baseline.   Physical Exam: Severe fat/muscle wasting.   Meds: Colace, Resource Breeze TID, Senna, metamucil Labs: BG 90-120   Recent Labs Lab 03/10/17 1622 03/11/17 0620  NA 140 140  K 3.8 3.8  CL 98* 102  CO2 31 30  BUN 30* 27*  CREATININE 0.62 0.56  CALCIUM 9.3 8.6*  GLUCOSE 124* 94   Diet Order:  Diet regular Room service appropriate? Yes; Fluid consistency: Thin  Skin:  Abrasion to R leg, L hip incision.   Last BM:   Unknown  Height:  Ht Readings from Last 1 Encounters:  03/10/17 5' (1.524 m)   Weight:  Wt Readings from Last 1 Encounters:  03/10/17 72 lb (32.7 kg)   Wt Readings from Last 10 Encounters:  03/10/17 72 lb (32.7 kg)  02/06/17 70 lb 9.6 oz (32 kg)  01/27/17 71 lb 12.8 oz (32.6 kg)  01/26/17 70 lb 12.8 oz (32.1 kg)  01/23/17 70 lb 12.8 oz (32.1 kg)  06/22/13 90 lb (40.8 kg)   Ideal Body Weight:  45.45 kg  BMI:  Body mass index is 14.06 kg/m.  Estimated Nutritional Needs:  Kcal:  1000-1150 kcals (30-34 kcal/kg bw) Protein:  50-60g Pro (1.5-1.8 g/kg bw) Fluid:  >.8 L (25 ml/kcal)  EDUCATION NEEDS:  No education needs identified at this time  Christophe LouisNathan Dorrene Bently RD, LDN, CNSC Clinical Nutrition Pager: [phone] 03/11/2017 12:58 PM

## 2017-03-11 NOTE — Progress Notes (Signed)
PROGRESS NOTE        PATIENT DETAILS Name: Jamie Carlson Age: 83 y.o. Sex: female Date of Birth: Jun 30, 1918 Admit Date: 03/10/2017 Admitting Physician Michael Litter, MD ZOX:[id number], No Pcp Per  Brief Narrative: Patient is a 83 y.o. female with past medical history of hypertension, mild aortic stenosis, mild dementia/cognitive dysfunction admitted following a mechanical fall-found to have a left hip fracture. Underwent left hip hemiarthroplasty on 5/5. See below for further details  Subjective: Very hard of hearing-not sure if she has underlying dementia-lying comfortably in bed.  Assessment/Plan: Left hip fracture: Following a mechanical fall, she underwent left hip hemiarthroplasty on 5/5. Orthopedic following, DVT prophylaxis, and other postoperative issues will be deferred to the orthopedics team.  Hypertension: Blood pressure consistently on the higher side, we'll start low dose amlodipine and follow.  Constipation: Colace and MiraLAX-follow.  Mild aortic stenosis: Seen on echocardiogram done last admission-stable for outpatient follow-up.  Mild dysphagia: Continue dysphagia 3 diet.  Underweight status: Continue supplements  ? Dementia: Very hard of hearing-appears to be somewhat confused-very poor historian-minimize narcotics as much as possible-supportive measures.  DVT Prophylaxis: Aspirin twice a day per orthopedics  Code Status:  DNR  Family Communication: None at bedside  Disposition Plan: Remain inpatient-suspect will  SNF on discharge  Antimicrobial agents: Anti-infectives    Start     Dose/Rate Route Frequency Ordered Stop   03/11/17 1330  ceFAZolin (ANCEF) IVPB 2g/100 mL premix     2 g 200 mL/hr over 30 Minutes Intravenous Every 6 hours 03/11/17 0959 03/12/17 0129   03/11/17 0600  ceFAZolin (ANCEF) IVPB 2g/100 mL premix  Status:  Discontinued     2 g 200 mL/hr over 30 Minutes Intravenous On call to O.R. 03/10/17 2037 03/11/17  1047      Procedures: LEFT HIP HEMIARTHROPLASTY AND HARDWARE REMOVAL POSTERIOR 5/5  CONSULTS:  orthopedic surgery  Time spent: 25 minutes-Greater than 50% of this time was spent in counseling, explanation of diagnosis, planning of further management, and coordination of care.  MEDICATIONS: Scheduled Meds: . aspirin EC  81 mg Oral BID  . bupivacaine liposome  20 mL Infiltration Once  . Chlorhexidine Gluconate Cloth  6 each Topical Q0600  . docusate sodium  100 mg Oral QHS  . feeding supplement (ENSURE ENLIVE)  237 mL Oral TID BM  . fentaNYL      . mupirocin ointment  1 application Nasal BID  . psyllium  1 packet Oral QODAY  . [START ON 03/12/2017] senna-docusate  1 tablet Oral QODAY   Continuous Infusions: .  ceFAZolin (ANCEF) IV     PRN Meds:.acetaminophen **OR** acetaminophen, HYDROcodone-acetaminophen, menthol-cetylpyridinium **OR** phenol, metoCLOPramide **OR** metoCLOPramide (REGLAN) injection, ondansetron **OR** ondansetron (ZOFRAN) IV, polyethylene glycol   PHYSICAL EXAM: Vital signs: Vitals:   03/11/17 0951 03/11/17 1005 03/11/17 1021 03/11/17 1036  BP: 131/78 127/85 (!) 144/77 (!) 152/91  Pulse: (!) 45 61 (!) 58 66  Resp: 19 14 17 17   Temp: (!) 96.8 F (36 C)   97.3 F (36.3 C)  TempSrc:      SpO2: (!) 60% (!) 88% 100% 100%  Weight:      Height:       Filed Weights   03/10/17 1540  Weight: 32.7 kg (72 lb)   Body mass index is 14.06 kg/m.   General appearance :Awake, appears to be somewhat confused,  not in any distress.  Eyes:, pupils equally reactive to light and accomodation,no scleral icterus. HEENT: Atraumatic and Normocephalic Neck: supple, no JVD. No cervical lymphadenopathy Resp:Good air entry bilaterally, no added sounds  CVS: S1 S2 regular, +syst murmur GI: Bowel sounds present, Non tender and not distended with no gaurding, rigidity or rebound.No organomegaly Extremities: B/L Lower Ext shows no edema, both legs are warm to touch Neurology:   speech clear,Non focal, sensation is grossly intact. Musculoskeletal:No digital cyanosis Skin:No Rash, warm and dry Wounds:N/A  I have personally reviewed following labs and imaging studies  LABORATORY DATA: CBC:  Recent Labs Lab 03/10/17 1622 03/11/17 0620  WBC 14.7* 7.8  HGB 12.4 11.2*  HCT 38.6 36.4  MCV 93.0 93.6  PLT 303 252    Basic Metabolic Panel:  Recent Labs Lab 03/10/17 1622 03/11/17 0620  NA 140 140  K 3.8 3.8  CL 98* 102  CO2 31 30  GLUCOSE 124* 94  BUN 30* 27*  CREATININE 0.62 0.56  CALCIUM 9.3 8.6*    GFR: Estimated Creatinine Clearance: 21.2 mL/min (by C-G formula based on SCr of 0.56 mg/dL).  Liver Function Tests:  Recent Labs Lab 03/10/17 1622  AST 26  ALT 16  ALKPHOS 86  BILITOT 0.8  PROT 6.7  ALBUMIN 3.5   No results for input(s): LIPASE, AMYLASE in the last 168 hours. No results for input(s): AMMONIA in the last 168 hours.  Coagulation Profile:  Recent Labs Lab 03/11/17 0620  INR 1.05    Cardiac Enzymes: No results for input(s): CKTOTAL, CKMB, CKMBINDEX, TROPONINI in the last 168 hours.  BNP (last 3 results) No results for input(s): PROBNP in the last 8760 hours.  HbA1C: No results for input(s): HGBA1C in the last 72 hours.  CBG: No results for input(s): GLUCAP in the last 168 hours.  Lipid Profile: No results for input(s): CHOL, HDL, LDLCALC, TRIG, CHOLHDL, LDLDIRECT in the last 72 hours.  Thyroid Function Tests: No results for input(s): TSH, T4TOTAL, FREET4, T3FREE, THYROIDAB in the last 72 hours.  Anemia Panel: No results for input(s): VITAMINB12, FOLATE, FERRITIN, TIBC, IRON, RETICCTPCT in the last 72 hours.  Urine analysis:    Component Value Date/Time   COLORURINE STRAW (A) 01/21/2017 2236   APPEARANCEUR CLEAR 01/21/2017 2236   LABSPEC 1.004 (L) 01/21/2017 2236   PHURINE 6.0 01/21/2017 2236   GLUCOSEU NEGATIVE 01/21/2017 2236   HGBUR MODERATE (A) 01/21/2017 2236   BILIRUBINUR NEGATIVE 01/21/2017  2236   KETONESUR NEGATIVE 01/21/2017 2236   PROTEINUR NEGATIVE 01/21/2017 2236   UROBILINOGEN 1.0 06/22/2013 1458   NITRITE NEGATIVE 01/21/2017 2236   LEUKOCYTESUR SMALL (A) 01/21/2017 2236    Sepsis Labs: Lactic Acid, Venous No results found for: LATICACIDVEN  MICROBIOLOGY: Recent Results (from the past 240 hour(s))  Surgical pcr screen     Status: Abnormal   Collection Time: 03/10/17 10:25 PM  Result Value Ref Range Status   MRSA, PCR POSITIVE (A) NEGATIVE Final    Comment: RESULT CALLED TO, READ BACK BY AND VERIFIED WITH: DULLA,R RN [id number] AT 0145 SKEEN,P    Staphylococcus aureus POSITIVE (A) NEGATIVE Final    Comment:        The Xpert SA Assay (FDA approved for NASAL specimens in patients over 51 years of age), is one component of a comprehensive surveillance program.  Test performance has been validated by [facility] for patients greater than or equal to 63 year old. It is not intended to diagnose infection nor to guide or  monitor treatment.     RADIOLOGY STUDIES/RESULTS: Dg Chest 1 View  Result Date: 03/10/2017 CLINICAL DATA:  Pain after fall.  Preop. EXAM: CHEST 1 VIEW COMPARISON:  01/21/2017 FINDINGS: Heart size is within normal limits. There is aortic atherosclerosis. There is atelectasis at the left lung base. No overt pulmonary edema, pneumothorax nor acute osseous abnormality of the bony thorax. Degenerative changes are seen along the thoracic spine and both shoulders. IMPRESSION: Aortic atherosclerosis. Left basilar atelectasis. No acute pneumonic consolidation. Electronically Signed   By: Tollie Ethavid  Kwon M.D.   On: 03/10/2017 18:56   Dg Tibia/fibula Right  Result Date: 03/10/2017 CLINICAL DATA:  Right leg pain after fall EXAM: RIGHT TIBIA AND FIBULA - 2 VIEW COMPARISON:  None. FINDINGS: Bones appear osteopenic. No acute displaced fracture is noted. There is no joint dislocation. Soft tissue induration is seen along the anterior aspect of the proximal tibial shaft.  IMPRESSION: Soft tissue abrasion along the anterior aspect of the proximal leg. No underlying fracture. Electronically Signed   By: Tollie Ethavid  Kwon M.D.   On: 03/10/2017 19:02   Pelvis Portable  Result Date: 03/11/2017 CLINICAL DATA:  Status post left hip hemiarthroplasty. EXAM: PORTABLE PELVIS 1-2 VIEWS COMPARISON:  01/22/2017 FINDINGS: A left hip hemiarthroplasty device is identified. The hardware components are in anatomic alignment. No periprosthetic fracture or subluxation. Gas noted within the surrounding soft tissues. IMPRESSION: 1. No complications status post left hip hemiarthroplasty. Electronically Signed   By: Signa Kellaylor  Stroud M.D.   On: 03/11/2017 10:38   Dg Hip Unilat W Or Wo Pelvis 1 View Left  Result Date: 03/10/2017 CLINICAL DATA:  Left hip pain after fall EXAM: DG HIP (WITH OR WITHOUT PELVIS) 1V*L* COMPARISON:  01/22/2017 FINDINGS: Acute superolateral displacement of the left femoral neck and shaft relative to the femoral head since the intraoperative left femoral neck repair on 01/22/2017. Three cannulated screws are no longer imbedded within the femoral head. A true lateral view was unable to be provided to determine whether the displacement is anterior or posterior relative to the femoral head. The bony pelvis is grossly intact. No pubic rami fractures. IMPRESSION: New superolateral displacement of the left femoral neck and shaft relative to the femoral head with the tips of 3 cannulated screws no longer embedded within the left femoral head. No bony pelvic fracture is seen. Electronically Signed   By: Tollie Ethavid  Kwon M.D.   On: 03/10/2017 19:00     LOS: 1 day   Jeoffrey MassedShanker Symphoni Helbling, MD  [facility] Pager:[phone]  If 7PM-7AM, please contact night-coverage [email or web address] Password [email or web address] 03/11/2017, 1:18 PM

## 2017-03-11 NOTE — Anesthesia Postprocedure Evaluation (Signed)
Anesthesia Post Note  Patient: Jamie Carlson  Procedure(s) Performed: Procedure(s) (LRB): ARTHROPLASTY BIPOLAR HIP (HEMIARTHROPLASTY) AND HARDWARE REMOVAL POSTERIOR APPROACH (Left) HARDWARE REMOVAL  Patient location during evaluation: PACU Anesthesia Type: Spinal Level of consciousness: oriented and awake and alert Pain management: pain level controlled Vital Signs Assessment: post-procedure vital signs reviewed and stable Respiratory status: spontaneous breathing, respiratory function stable and patient connected to nasal cannula oxygen Cardiovascular status: blood pressure returned to baseline and stable Postop Assessment: no headache, no backache, spinal receding, no signs of nausea or vomiting and patient able to bend at knees Anesthetic complications: no       Last Vitals:  Vitals:   03/11/17 1021 03/11/17 1036  BP: (!) 144/77 (!) 152/91  Pulse: (!) 58 66  Resp: 17 17  Temp:  36.3 C    Last Pain:  Vitals:   03/11/17 1020  TempSrc:   PainSc: Asleep                 Cecile HearingStephen [name] Jamail Cullers

## 2017-03-11 NOTE — Progress Notes (Signed)
Pt awake, alert , oriented refusing to wear o2 sat monitor. Explained need for 24 hrs post surgery... Pt states she " breathing fine" and continues not to wear it. Will monitor frequently. Barbera Settersurner, Mallika Sanmiguel B RN

## 2017-03-11 NOTE — Transfer of Care (Signed)
Immediate Anesthesia Transfer of Care Note  Patient: Jamie Carlson  Procedure(s) Performed: Procedure(s): ARTHROPLASTY BIPOLAR HIP (HEMIARTHROPLASTY) AND HARDWARE REMOVAL POSTERIOR APPROACH (Left) HARDWARE REMOVAL  Patient Location: PACU  Anesthesia Type:Spinal  Level of Consciousness: awake and alert   Airway & Oxygen Therapy: Patient Spontanous Breathing and Patient connected to nasal cannula oxygen  Post-op Assessment: Report given to RN and Post -op Vital signs reviewed and stable  Post vital signs: Reviewed and stable  Last Vitals:  Vitals:   03/11/17 0524 03/11/17 0951  BP: (!) 152/80 131/78  Pulse: 78 (!) 45  Resp:  19  Temp: 36.7 C     Last Pain:  Vitals:   03/11/17 0524  TempSrc: Oral  PainSc:       Patients Stated Pain Goal: 3 (03/10/17 2130)  Complications: No apparent anesthesia complications

## 2017-03-11 NOTE — Progress Notes (Signed)
Patient's iv was leaking; iv was removed; iv team consulted due to patient having poor vein options and hard stick.  Iv  team came but patient fussed out everyone and adamantly refused.  As per patient "get out I want to get some sleep; no do this tomorrow. I want to be left alone!"

## 2017-03-11 NOTE — Progress Notes (Signed)
Found pt with foley catheter out with bulb intact. States "it was bothering me". Will not replace. Pt cleaned and pads  changed.  Told to call if she needs to void. Will monitor for output . Barbera Settersurner, Jannifer Fischler B RN

## 2017-03-11 NOTE — Interval H&P Note (Signed)
History and Physical Interval Note:  03/11/2017 7:15 AM  Jamie Carlson  has presented today for surgery, with the diagnosis of LEFT HIP FRACTURE  The various methods of treatment have been discussed with the patient and family. After consideration of risks, benefits and other options for treatment, the patient has consented to  Procedure(s): ARTHROPLASTY BIPOLAR HIP (HEMIARTHROPLASTY) AND HARDWARE REMOVAL POSTERIOR APPROACH (Left) as a surgical intervention .  The patient's history has been reviewed, patient examined, no change in status, stable for surgery.  I have reviewed the patient's chart and labs.  Questions were answered to the patient's satisfaction.     Taye Cato G

## 2017-03-12 ENCOUNTER — Encounter (HOSPITAL_COMMUNITY): Payer: Self-pay | Admitting: *Deleted

## 2017-03-12 DIAGNOSIS — I350 Nonrheumatic aortic (valve) stenosis: Secondary | ICD-10-CM

## 2017-03-12 LAB — BASIC METABOLIC PANEL
Anion gap: 7 (ref 5–15)
BUN: 18 mg/dL (ref 6–20)
CHLORIDE: 102 mmol/L (ref 101–111)
CO2: 28 mmol/L (ref 22–32)
Calcium: 8.4 mg/dL — ABNORMAL LOW (ref 8.9–10.3)
Creatinine, Ser: 0.54 mg/dL (ref 0.44–1.00)
GFR calc Af Amer: >60 mL/min (ref >60)
GFR calc non Af Amer: >60 mL/min (ref >60)
GLUCOSE: 157 mg/dL — AB (ref 65–99)
POTASSIUM: 3.2 mmol/L — AB (ref 3.5–5.1)
Sodium: 137 mmol/L (ref 135–145)

## 2017-03-12 LAB — URINALYSIS, ROUTINE W REFLEX MICROSCOPIC
Bilirubin Urine: NEGATIVE
Glucose, UA: NEGATIVE mg/dL
Ketones, ur: NEGATIVE mg/dL
Nitrite: NEGATIVE
PROTEIN: 100 mg/dL — AB
Specific Gravity, Urine: 1.025 (ref 1.005–1.030)
pH: 5 (ref 5.0–8.0)

## 2017-03-12 LAB — CBC
HEMATOCRIT: 32.2 % — AB (ref 36.0–46.0)
Hemoglobin: 10.2 g/dL — ABNORMAL LOW (ref 12.0–15.0)
MCH: 29.4 pg (ref 26.0–34.0)
MCHC: 31.7 g/dL (ref 30.0–36.0)
MCV: 92.8 fL (ref 78.0–100.0)
Platelets: 263 10*3/uL (ref 150–400)
RBC: 3.47 MIL/uL — AB (ref 3.87–5.11)
RDW: 12.9 % (ref 11.5–15.5)
WBC: 12.2 10*3/uL — ABNORMAL HIGH (ref 4.0–10.5)

## 2017-03-12 MED ORDER — POTASSIUM CHLORIDE CRYS ER 20 MEQ PO TBCR: 20.0000 meq | EXTENDED_RELEASE_TABLET | Freq: Once | ORAL | Status: DC

## 2017-03-12 MED ORDER — POTASSIUM CHLORIDE CRYS ER 20 MEQ PO TBCR
40.0000 meq | EXTENDED_RELEASE_TABLET | Freq: Once | ORAL | Status: AC
  Administered 2017-03-12: 40 meq via ORAL
  Filled 2017-03-12: qty 2

## 2017-03-12 MED ORDER — AMLODIPINE BESYLATE 5 MG PO TABS
5.0000 mg | ORAL_TABLET | Freq: Every day | ORAL | Status: DC
  Administered 2017-03-13: 5 mg via ORAL
  Filled 2017-03-12: qty 1

## 2017-03-12 NOTE — Progress Notes (Signed)
Subjective: 1 Day Post-Op Procedure(s) (LRB): ARTHROPLASTY BIPOLAR HIP (HEMIARTHROPLASTY) AND HARDWARE REMOVAL POSTERIOR APPROACH (Left) HARDWARE REMOVAL   Patient resting comfortably in bed. She has no pain but is tired. She states she has had too many visitors and just needs to rest a little. She states that she will try PT this afternoon after I asked politely.  Activity level:  wbat with posterior hip precautions Diet tolerance:  ok Voiding:  ok Patient reports pain as mild.    Objective: Vital signs in last 24 hours: Temp:  [97.8 F (36.6 C)-98.5 F (36.9 C)] 97.8 F (36.6 C) (05/06 0538) Pulse Rate:  [99] 99 (05/06 0538) BP: (164-179)/(77-83) 179/77 (05/06 0538) SpO2:  [99 %-100 %] 99 % (05/06 0538)  Labs:  Recent Labs  03/10/17 1622 03/11/17 0620 03/12/17 0242  HGB 12.4 11.2* 10.2*    Recent Labs  03/11/17 0620 03/12/17 0242  WBC 7.8 12.2*  RBC 3.89 3.47*  HCT 36.4 32.2*  PLT 252 263    Recent Labs  03/11/17 0620 03/12/17 0242  NA 140 137  K 3.8 3.2*  CL 102 102  CO2 30 28  BUN 27* 18  CREATININE 0.56 0.54  GLUCOSE 94 157*  CALCIUM 8.6* 8.4*    Recent Labs  03/11/17 0620  INR 1.05    Physical Exam:  Neurologically intact ABD soft Neurovascular intact Sensation intact distally Intact pulses distally Dorsiflexion/Plantar flexion intact Incision: dressing C/D/I and no drainage No cellulitis present Compartment soft  Assessment/Plan:  1 Day Post-Op Procedure(s) (LRB): ARTHROPLASTY BIPOLAR HIP (HEMIARTHROPLASTY) AND HARDWARE REMOVAL POSTERIOR APPROACH (Left) HARDWARE REMOVAL Advance diet Up with therapy  Continue on ASA 81mg  BID x 4 weeks for DVT prevention Tylenol only for pain to decrease confusion. D/C to SNF is likely unless family can help at home. Cont. WBAT with posterior hip precautions. She should have knee immobilizer on in bed if she will allow. Follow up in office 2 weeks post op. We greatly appreciate medical  management.  Elige Shouse, Ginger OrganNDREW PAUL 03/12/2017, 10:57 AM

## 2017-03-12 NOTE — Evaluation (Signed)
Physical Therapy Evaluation Patient Details Name: Jamie Carlson MRN: [id number] DOB: 1918-08-06 Today's Date: 03/12/2017   History of Present Illness  Pt is a 83 y.o. woman with a history of HTN, severe protein calorie malnutrition (she is on a dysphagia 3 diet), mild aortic stenosis, and grade 1 diastolic dysfunction. She was admitted in March for left femoral neck fracture after having a mechanical fall.  She required cannulated hip pinning 01-22-17.  She lives at home with her son, and apparently has had at least two falls this week.  She started complaining of new pain in her left hip.  Imaging revealed a displaced femoral neck fracture.  She underwent L hip hemiarthroplasty 03-11-17.   Clinical Impression  Pt admitted with above diagnosis. Pt currently with functional limitations due to the deficits listed below (see PT Problem List). On eval, pt required mod assist bed mobility. Unable to progress with transfers/ambulation due to pt refusal. Pt lethargic and fatigued. RN reports she did not sleep well last night. PT to progress mobility as pt tolerates. She is refusing to wear knee immobilizer in bed.  Pt will benefit from skilled PT to increase their independence and safety with mobility to allow discharge to the venue listed below.       Follow Up Recommendations SNF;Supervision/Assistance - 24 hour    Equipment Recommendations  None recommended by PT    Recommendations for Other Services       Precautions / Restrictions Precautions Precautions: Fall;Posterior Hip Precaution Comments: Educated pt on 3/3 hip precautions. Handout displayed in room. Required Braces or Orthoses: Knee Immobilizer - Left Knee Immobilizer - Left: Other (comment) (on in bed) Restrictions LLE Weight Bearing: Weight bearing as tolerated Other Position/Activity Restrictions: Pt refusing knee immobilizer.       Mobility  Bed Mobility Overal bed mobility: Needs Assistance Bed Mobility: Rolling;Supine to  Sit Rolling: Mod assist   Supine to sit: Mod assist;HOB elevated     General bed mobility comments: +rail, continuous verbal cues for precautions and sequencing, unable to attain full, upright sitting EOB. Pt declining further mobility once initiated and requesting return to supine.   Transfers                 General transfer comment: unable to progress  Ambulation/Gait             General Gait Details: unable to progress  Stairs            Wheelchair Mobility    Modified Rankin (Stroke Patients Only)       Balance                                             Pertinent Vitals/Pain Pain Assessment: Faces Faces Pain Scale: Hurts little more Pain Location: LLE Pain Descriptors / Indicators: Grimacing;Guarding Pain Intervention(s): Limited activity within patient's tolerance;Monitored during session    Home Living Family/patient expects to be discharged to:: Skilled nursing facility                      Prior Function Level of Independence: Independent with assistive device(s)         Comments: Prior to previous fall/hip fx 01/2017 pt lived alone independent, community ambulator. Pt went to [facility] in March and had d/c'd home with son. She ambulated with RW.  Hand Dominance   Dominant Hand: Right    Extremity/Trunk Assessment                Communication   Communication: HOH  Cognition Arousal/Alertness: Lethargic Behavior During Therapy: WFL for tasks assessed/performed Overall Cognitive Status: History of cognitive impairments - at baseline                                        General Comments      Exercises Total Joint Exercises Ankle Circles/Pumps: AROM;Both;5 reps;Supine Heel Slides: AAROM;Left;5 reps;Supine   Assessment/Plan    PT Assessment Patient needs continued PT services  PT Problem List Decreased mobility;Decreased activity tolerance;Decreased  balance;Decreased strength;Decreased safety awareness;Decreased knowledge of precautions;Pain       PT Treatment Interventions DME instruction;Gait training;Functional mobility training;Balance training;Therapeutic exercise;Therapeutic activities;Patient/family education    PT Goals (Current goals can be found in the Care Plan section)  Acute Rehab PT Goals Patient Stated Goal: not stated PT Goal Formulation: With patient/family Time For Goal Achievement: 03/26/17 Potential to Achieve Goals: Fair    Frequency Min 3X/week   Barriers to discharge        Co-evaluation               AM-PAC PT "6 Clicks" Daily Activity  Outcome Measure Difficulty turning over in bed (including adjusting bedclothes, sheets and blankets)?: Total Difficulty moving from lying on back to sitting on the side of the bed? : Total Difficulty sitting down on and standing up from a chair with arms (e.g., wheelchair, bedside commode, etc,.)?: Total Help needed moving to and from a bed to chair (including a wheelchair)?: Total Help needed walking in hospital room?: Total Help needed climbing 3-5 steps with a railing? : Total 6 Click Score: 6    End of Session   Activity Tolerance: Patient limited by lethargy;Patient limited by fatigue Patient left: in bed;with bed alarm set;with call bell/phone within reach Nurse Communication: Mobility status PT Visit Diagnosis: Muscle weakness (generalized) (M62.81);Pain;Difficulty in walking, not elsewhere classified (R26.2) Pain - Right/Left: Left Pain - part of body: Leg    Time: 0900-0920 PT Time Calculation (min) (ACUTE ONLY): 20 min   Charges:   PT Evaluation $PT Eval Moderate Complexity: 1 Procedure     PT G Codes:        Aida RaiderWendy Shadrach Bartunek, PT  Office # [phone] Pager [phone]   Ilda FoilGarrow, Christpoher Sievers Rene 03/12/2017, 10:00 AM

## 2017-03-12 NOTE — Progress Notes (Addendum)
PROGRESS NOTE        PATIENT DETAILS Name: Jamie Carlson Age: 83 y.o. Sex: female Date of Birth: 09-03-1918 Admit Date: 03/10/2017 Admitting Physician Michael Litter, MD RUE:[id number], No Pcp Per  Brief Narrative: Patient is a 83 y.o. female with past medical history of hypertension, mild aortic stenosis, mild dementia/cognitive dysfunction admitted following a mechanical fall-found to have a left hip fracture. Underwent left hip hemiarthroplasty on 5/5. See below for further details  Subjective: Lying comfortably in bed-arousable. Some pain at the operative site-but denies chest pain, shortness of breath, nausea, vomiting or diarrhea.   Assessment/Plan: Left hip fracture: Following a mechanical fall, underwent left hip hemiarthroplasty on 5/5. Orthopedic following, recommendations are weightbearing as tolerated, to continue ASA 81 mg twice a day for 4 weeks. Suspect will require SNF on discharge. Will consult social work.   Hypertension: Blood pressure still on the higher side-increase amlodipine to 5 mg daily. Follow and adjust accordingly.   Constipation: Continue with Colace/miraLax-follow.   Mild aortic stenosis: Stable-seen on echocardiogram last admission-stable for outpatient follow-up.   Mild dysphagia: Continue dysphagia 3 diet.  Underweight status: Continue supplements  ? Dementia: Very hard of hearing-appears to be somewhat confused-very poor historian-minimize narcotics as much as possible-supportive measures.  DVT Prophylaxis: Aspirin twice a day per orthopedics  Code Status:  DNR  Family Communication: None at bedside  Disposition Plan: Remain inpatient-SNF on discharge-hopefully on 5/7  Antimicrobial agents: Anti-infectives    Start     Dose/Rate Route Frequency Ordered Stop   03/11/17 1330  ceFAZolin (ANCEF) IVPB 2g/100 mL premix     2 g 200 mL/hr over 30 Minutes Intravenous Every 6 hours 03/11/17 0959 03/11/17 2103   03/11/17  0600  ceFAZolin (ANCEF) IVPB 2g/100 mL premix  Status:  Discontinued     2 g 200 mL/hr over 30 Minutes Intravenous On call to O.R. 03/10/17 2037 03/11/17 1047      Procedures: LEFT HIP HEMIARTHROPLASTY AND HARDWARE REMOVAL POSTERIOR 5/5  CONSULTS:  orthopedic surgery  Time spent: 25 minutes-Greater than 50% of this time was spent in counseling, explanation of diagnosis, planning of further management, and coordination of care.  MEDICATIONS: Scheduled Meds: . acetaminophen (TYLENOL) oral liquid 160 mg/5 mL  1,000 mg Oral Q8H  . amLODipine  2.5 mg Oral Daily  . aspirin EC  81 mg Oral BID  . bupivacaine liposome  20 mL Infiltration Once  . Chlorhexidine Gluconate Cloth  6 each Topical Q0600  . docusate sodium  100 mg Oral QHS  . feeding supplement (ENSURE ENLIVE)  237 mL Oral TID BM  . mupirocin ointment  1 application Nasal BID  . polyethylene glycol  17 g Oral Daily  . psyllium  1 packet Oral QODAY  . senna-docusate  1 tablet Oral QODAY   Continuous Infusions:  PRN Meds:.menthol-cetylpyridinium **OR** phenol, metoCLOPramide **OR** metoCLOPramide (REGLAN) injection, ondansetron **OR** ondansetron (ZOFRAN) IV   PHYSICAL EXAM: Vital signs: Vitals:   03/11/17 1021 03/11/17 1036 03/11/17 2025 03/12/17 0538  BP: (!) 144/77 (!) 152/91 (!) 164/83 (!) 179/77  Pulse: (!) 58 66 99 99  Resp: 17 17    Temp:  97.3 F (36.3 C) 98.5 F (36.9 C) 97.8 F (36.6 C)  TempSrc:   Oral Oral  SpO2: 100% 100% 100% 99%  Weight:      Height:  Filed Weights   03/10/17 1540  Weight: 32.7 kg (72 lb)   Body mass index is 14.06 kg/m.   General appearance :Awake, alert-very hard of hearing-but lying comfortably in bed Eyes:, pupils equally reactive to light and accomodation,no scleral icterus. HEENT: Atraumatic and Normocephalic Neck: supple, no JVD. Resp:Good air entry bilaterally, no rales or rhonchi CVS: S1 S2 regular, no murmurs.  GI: Bowel sounds present, Non tender and not  distended with no gaurding, rigidity or rebound. Extremities: B/L Lower Ext shows no edema, both legs are warm to touch Neurology:  Non focal Psychiatric: Normal judgment and insight. Normal mood. Musculoskeletal:No digital cyanosis Skin:No Rash, warm and dry Wounds:N/A  I have personally reviewed following labs and imaging studies  LABORATORY DATA: CBC:  Recent Labs Lab 03/10/17 1622 03/11/17 0620 03/12/17 0242  WBC 14.7* 7.8 12.2*  HGB 12.4 11.2* 10.2*  HCT 38.6 36.4 32.2*  MCV 93.0 93.6 92.8  PLT 303 252 263    Basic Metabolic Panel:  Recent Labs Lab 03/10/17 1622 03/11/17 0620 03/12/17 0242  NA 140 140 137  K 3.8 3.8 3.2*  CL 98* 102 102  CO2 31 30 28   GLUCOSE 124* 94 157*  BUN 30* 27* 18  CREATININE 0.62 0.56 0.54  CALCIUM 9.3 8.6* 8.4*    GFR: Estimated Creatinine Clearance: 21.2 mL/min (by C-G formula based on SCr of 0.54 mg/dL).  Liver Function Tests:  Recent Labs Lab 03/10/17 1622  AST 26  ALT 16  ALKPHOS 86  BILITOT 0.8  PROT 6.7  ALBUMIN 3.5   No results for input(s): LIPASE, AMYLASE in the last 168 hours. No results for input(s): AMMONIA in the last 168 hours.  Coagulation Profile:  Recent Labs Lab 03/11/17 0620  INR 1.05    Cardiac Enzymes: No results for input(s): CKTOTAL, CKMB, CKMBINDEX, TROPONINI in the last 168 hours.  BNP (last 3 results) No results for input(s): PROBNP in the last 8760 hours.  HbA1C: No results for input(s): HGBA1C in the last 72 hours.  CBG: No results for input(s): GLUCAP in the last 168 hours.  Lipid Profile: No results for input(s): CHOL, HDL, LDLCALC, TRIG, CHOLHDL, LDLDIRECT in the last 72 hours.  Thyroid Function Tests: No results for input(s): TSH, T4TOTAL, FREET4, T3FREE, THYROIDAB in the last 72 hours.  Anemia Panel: No results for input(s): VITAMINB12, FOLATE, FERRITIN, TIBC, IRON, RETICCTPCT in the last 72 hours.  Urine analysis:    Component Value Date/Time   COLORURINE STRAW  (A) 01/21/2017 2236   APPEARANCEUR CLEAR 01/21/2017 2236   LABSPEC 1.004 (L) 01/21/2017 2236   PHURINE 6.0 01/21/2017 2236   GLUCOSEU NEGATIVE 01/21/2017 2236   HGBUR MODERATE (A) 01/21/2017 2236   BILIRUBINUR NEGATIVE 01/21/2017 2236   KETONESUR NEGATIVE 01/21/2017 2236   PROTEINUR NEGATIVE 01/21/2017 2236   UROBILINOGEN 1.0 06/22/2013 1458   NITRITE NEGATIVE 01/21/2017 2236   LEUKOCYTESUR SMALL (A) 01/21/2017 2236    Sepsis Labs: Lactic Acid, Venous No results found for: LATICACIDVEN  MICROBIOLOGY: Recent Results (from the past 240 hour(s))  Surgical pcr screen     Status: Abnormal   Collection Time: 03/10/17 10:25 PM  Result Value Ref Range Status   MRSA, PCR POSITIVE (A) NEGATIVE Final    Comment: RESULT CALLED TO, READ BACK BY AND VERIFIED WITH: DULLA,R RN [id number] AT 0145 SKEEN,P    Staphylococcus aureus POSITIVE (A) NEGATIVE Final    Comment:        The Xpert SA Assay (FDA approved for NASAL specimens in  patients over 10221 years of age), is one component of a comprehensive surveillance program.  Test performance has been validated by [facility] for patients greater than or equal to 223 year old. It is not intended to diagnose infection nor to guide or monitor treatment.     RADIOLOGY STUDIES/RESULTS: Dg Chest 1 View  Result Date: 03/10/2017 CLINICAL DATA:  Pain after fall.  Preop. EXAM: CHEST 1 VIEW COMPARISON:  01/21/2017 FINDINGS: Heart size is within normal limits. There is aortic atherosclerosis. There is atelectasis at the left lung base. No overt pulmonary edema, pneumothorax nor acute osseous abnormality of the bony thorax. Degenerative changes are seen along the thoracic spine and both shoulders. IMPRESSION: Aortic atherosclerosis. Left basilar atelectasis. No acute pneumonic consolidation. Electronically Signed   By: Tollie Ethavid  Kwon M.D.   On: 03/10/2017 18:56   Dg Tibia/fibula Right  Result Date: 03/10/2017 CLINICAL DATA:  Right leg pain after fall EXAM: RIGHT  TIBIA AND FIBULA - 2 VIEW COMPARISON:  None. FINDINGS: Bones appear osteopenic. No acute displaced fracture is noted. There is no joint dislocation. Soft tissue induration is seen along the anterior aspect of the proximal tibial shaft. IMPRESSION: Soft tissue abrasion along the anterior aspect of the proximal leg. No underlying fracture. Electronically Signed   By: Tollie Ethavid  Kwon M.D.   On: 03/10/2017 19:02   Pelvis Portable  Result Date: 03/11/2017 CLINICAL DATA:  Status post left hip hemiarthroplasty. EXAM: PORTABLE PELVIS 1-2 VIEWS COMPARISON:  01/22/2017 FINDINGS: A left hip hemiarthroplasty device is identified. The hardware components are in anatomic alignment. No periprosthetic fracture or subluxation. Gas noted within the surrounding soft tissues. IMPRESSION: 1. No complications status post left hip hemiarthroplasty. Electronically Signed   By: Signa Kellaylor  Stroud M.D.   On: 03/11/2017 10:38   Dg Hip Unilat W Or Wo Pelvis 1 View Left  Result Date: 03/10/2017 CLINICAL DATA:  Left hip pain after fall EXAM: DG HIP (WITH OR WITHOUT PELVIS) 1V*L* COMPARISON:  01/22/2017 FINDINGS: Acute superolateral displacement of the left femoral neck and shaft relative to the femoral head since the intraoperative left femoral neck repair on 01/22/2017. Three cannulated screws are no longer imbedded within the femoral head. A true lateral view was unable to be provided to determine whether the displacement is anterior or posterior relative to the femoral head. The bony pelvis is grossly intact. No pubic rami fractures. IMPRESSION: New superolateral displacement of the left femoral neck and shaft relative to the femoral head with the tips of 3 cannulated screws no longer embedded within the left femoral head. No bony pelvic fracture is seen. Electronically Signed   By: Tollie Ethavid  Kwon M.D.   On: 03/10/2017 19:00     LOS: 2 days   Jeoffrey MassedShanker Ghimire, MD  [facility] Pager:[phone]  If 7PM-7AM, please contact  night-coverage [email or web address] Password [email or web address] 03/12/2017, 2:37 PM

## 2017-03-12 NOTE — Progress Notes (Signed)
OT Cancellation Note  Patient Details Name: Jamie Carlson MRN: [id number] DOB: 06-01-1918   Cancelled Treatment:    Reason Eval/Treat Not Completed: Other (comment) (Defer to SNF) - Pt from SNF and planning to return for post-acute rehab stay. Will defer OT evaluation and further treatment to SNF. OT signing off. Please re-consult if needed.  Nils PyleJulia Wladyslawa Disbro, MS, OTR/L 03/12/2017, 9:42 AM

## 2017-03-13 ENCOUNTER — Encounter (HOSPITAL_COMMUNITY): Payer: Self-pay | Admitting: Orthopaedic Surgery

## 2017-03-13 DIAGNOSIS — M25559 Pain in unspecified hip: Secondary | ICD-10-CM | POA: Diagnosis not present

## 2017-03-13 DIAGNOSIS — E43 Unspecified severe protein-calorie malnutrition: Secondary | ICD-10-CM | POA: Diagnosis not present

## 2017-03-13 DIAGNOSIS — S728X9A Other fracture of unspecified femur, initial encounter for closed fracture: Secondary | ICD-10-CM | POA: Diagnosis not present

## 2017-03-13 DIAGNOSIS — D500 Iron deficiency anemia secondary to blood loss (chronic): Secondary | ICD-10-CM | POA: Diagnosis not present

## 2017-03-13 DIAGNOSIS — S72002S Fracture of unspecified part of neck of left femur, sequela: Secondary | ICD-10-CM | POA: Diagnosis not present

## 2017-03-13 DIAGNOSIS — M25551 Pain in right hip: Secondary | ICD-10-CM | POA: Diagnosis not present

## 2017-03-13 DIAGNOSIS — R2681 Unsteadiness on feet: Secondary | ICD-10-CM | POA: Diagnosis not present

## 2017-03-13 DIAGNOSIS — W19XXXA Unspecified fall, initial encounter: Secondary | ICD-10-CM | POA: Diagnosis not present

## 2017-03-13 DIAGNOSIS — R296 Repeated falls: Secondary | ICD-10-CM | POA: Diagnosis not present

## 2017-03-13 DIAGNOSIS — E876 Hypokalemia: Secondary | ICD-10-CM | POA: Diagnosis not present

## 2017-03-13 DIAGNOSIS — R278 Other lack of coordination: Secondary | ICD-10-CM | POA: Diagnosis not present

## 2017-03-13 DIAGNOSIS — S72092D Other fracture of head and neck of left femur, subsequent encounter for closed fracture with routine healing: Secondary | ICD-10-CM | POA: Diagnosis not present

## 2017-03-13 DIAGNOSIS — S92153A Displaced avulsion fracture (chip fracture) of unspecified talus, initial encounter for closed fracture: Secondary | ICD-10-CM | POA: Diagnosis not present

## 2017-03-13 DIAGNOSIS — M6281 Muscle weakness (generalized): Secondary | ICD-10-CM | POA: Diagnosis not present

## 2017-03-13 DIAGNOSIS — R1312 Dysphagia, oropharyngeal phase: Secondary | ICD-10-CM | POA: Diagnosis not present

## 2017-03-13 DIAGNOSIS — I10 Essential (primary) hypertension: Secondary | ICD-10-CM | POA: Diagnosis not present

## 2017-03-13 DIAGNOSIS — R262 Difficulty in walking, not elsewhere classified: Secondary | ICD-10-CM | POA: Diagnosis not present

## 2017-03-13 DIAGNOSIS — M25552 Pain in left hip: Secondary | ICD-10-CM | POA: Diagnosis not present

## 2017-03-13 DIAGNOSIS — F0390 Unspecified dementia without behavioral disturbance: Secondary | ICD-10-CM | POA: Diagnosis not present

## 2017-03-13 DIAGNOSIS — S72002A Fracture of unspecified part of neck of left femur, initial encounter for closed fracture: Secondary | ICD-10-CM | POA: Diagnosis not present

## 2017-03-13 DIAGNOSIS — K143 Hypertrophy of tongue papillae: Secondary | ICD-10-CM | POA: Diagnosis not present

## 2017-03-13 DIAGNOSIS — R1310 Dysphagia, unspecified: Secondary | ICD-10-CM | POA: Diagnosis not present

## 2017-03-13 DIAGNOSIS — Z471 Aftercare following joint replacement surgery: Secondary | ICD-10-CM | POA: Diagnosis not present

## 2017-03-13 DIAGNOSIS — D649 Anemia, unspecified: Secondary | ICD-10-CM | POA: Diagnosis not present

## 2017-03-13 DIAGNOSIS — Z96642 Presence of left artificial hip joint: Secondary | ICD-10-CM | POA: Diagnosis not present

## 2017-03-13 DIAGNOSIS — R630 Anorexia: Secondary | ICD-10-CM | POA: Diagnosis not present

## 2017-03-13 DIAGNOSIS — K5909 Other constipation: Secondary | ICD-10-CM | POA: Diagnosis not present

## 2017-03-13 LAB — BASIC METABOLIC PANEL
Anion gap: 6 (ref 5–15)
BUN: 23 mg/dL — AB (ref 6–20)
CO2: 28 mmol/L (ref 22–32)
CREATININE: 0.55 mg/dL (ref 0.44–1.00)
Calcium: 8.2 mg/dL — ABNORMAL LOW (ref 8.9–10.3)
Chloride: 104 mmol/L (ref 101–111)
GFR calc Af Amer: >60 mL/min (ref >60)
GLUCOSE: 101 mg/dL — AB (ref 65–99)
Potassium: 3.3 mmol/L — ABNORMAL LOW (ref 3.5–5.1)
SODIUM: 138 mmol/L (ref 135–145)

## 2017-03-13 LAB — CBC
HCT: 29.6 % — ABNORMAL LOW (ref 36.0–46.0)
Hemoglobin: 9.4 g/dL — ABNORMAL LOW (ref 12.0–15.0)
MCH: 29.4 pg (ref 26.0–34.0)
MCHC: 31.8 g/dL (ref 30.0–36.0)
MCV: 92.5 fL (ref 78.0–100.0)
PLATELETS: 236 10*3/uL (ref 150–400)
RBC: 3.2 MIL/uL — ABNORMAL LOW (ref 3.87–5.11)
RDW: 12.8 % (ref 11.5–15.5)
WBC: 9.2 10*3/uL (ref 4.0–10.5)

## 2017-03-13 MED ORDER — AMLODIPINE BESYLATE 5 MG PO TABS
5.0000 mg | ORAL_TABLET | Freq: Every day | ORAL | Status: AC
Start: 2017-03-13 — End: ?

## 2017-03-13 MED ORDER — ACETAMINOPHEN 500 MG PO TABS: 1000.0000 mg | ORAL_TABLET | Freq: Three times a day (TID) | ORAL | Status: DC

## 2017-03-13 MED ORDER — ORAL CARE MOUTH RINSE
15.0000 mL | Freq: Two times a day (BID) | OROMUCOSAL | Status: DC
  Administered 2017-03-13 (×2): 15 mL via OROMUCOSAL

## 2017-03-13 MED ORDER — ASPIRIN 81 MG PO TBEC: 81.0000 mg | DELAYED_RELEASE_TABLET | Freq: Two times a day (BID) | ORAL | Status: DC

## 2017-03-13 MED ORDER — POTASSIUM CHLORIDE CRYS ER 20 MEQ PO TBCR
40.0000 meq | EXTENDED_RELEASE_TABLET | Freq: Once | ORAL | Status: AC
Start: 2017-03-13 — End: 2017-03-13
  Administered 2017-03-13: 40 meq via ORAL
  Filled 2017-03-13 (×2): qty 2

## 2017-03-13 NOTE — Clinical Social Work Note (Signed)
Clinical Social Work Assessment  Patient Details  Name: Jamie Carlson MRN: [id number] Date of Birth: [birth year]  Date of referral:  03/13/17               Reason for consult:  Facility Placement                Permission sought to share information with:  Facility Industrial/product designerContact Representative Permission granted to share information::  Yes, Verbal Permission Granted  Name::     son Jamie HolmFred  Agency::  SNF  Relationship::     Contact Information:     Housing/Transportation Living arrangements for the past 2 months:  Single Family Home Source of Information:  Adult Children Patient Interpreter Needed:  None Criminal Activity/Legal Involvement Pertinent to Current Situation/Hospitalization:  No - Comment as needed Significant Relationships:  Adult Children Lives with:  Adult Children Do you feel safe going back to the place where you live?  No Need for family participation in patient care:  Yes (Comment)  Care giving concerns:  Patient resided with son at home. Patient had --a fall at home and is unsafe to return at this time.  Social Worker assessment / plan:  CSW spoke with son to discuss DC plan to SNF as patient oriented to self only.  Son advised that he [facility] she has experienced that facility in the past. CSW explained role and SNF options. Son gave permission to send to other SNF in [place] area if [facility] does not have an availability.    Employment status:  Retired Database administratornsurance information:  Managed Medicare PT Recommendations:  Skilled Nursing Facility Information / Referral to community resources:  Skilled Nursing Facility  Patient/Family's Response to care:  Family appreciative of assistance and hopes that CSW can get [facility]. No issues or concerns at this time.  Patient/Family's Understanding of and Emotional Response to Diagnosis, Current Treatment, and Prognosis:   Family has good understanding of the recommendations at DC and  understand the diagnosis, current treatment, and prognosis. Family hopeful that patient impairment will be addressed.  Emotional Assessment Appearance:  Appears stated age Attitude/Demeanor/Rapport:   (Cooperative) Affect (typically observed):  Accepting, Appropriate Orientation:  Oriented to Self Alcohol / Substance use:  Not Applicable Psych involvement (Current and /or in the community):  Yes (Comment)  Discharge Needs  Concerns to be addressed:  Care Coordination Readmission within the last 30 days:  No Current discharge risk:  Physical Impairment, Dependent with Mobility Barriers to Discharge:  No Barriers Identified   Jamie Mooreatricia V Jamie Macha, LCSW 03/13/2017, 10:40 AM

## 2017-03-13 NOTE — Progress Notes (Signed)
Subjective: 2 Days Post-Op Procedure(s) (LRB): ARTHROPLASTY BIPOLAR HIP (HEMIARTHROPLASTY) AND HARDWARE REMOVAL POSTERIOR APPROACH (Left) HARDWARE REMOVAL   Patient doing well with no complaints. She is not wearing knee immobilizer in bed but she is not agitated so we will see if she will become more receptive to it.   Objective: Vital signs in last 24 hours: Temp:  [97.8 F (36.6 C)-98.1 F (36.7 C)] 97.8 F (36.6 C) (05/07 0517) Pulse Rate:  [73-95] 73 (05/07 0517) Resp:  [16] 16 (05/07 0517) BP: (133-154)/(72-79) 133/76 (05/07 0517) SpO2:  [96 %-100 %] 100 % (05/07 0517)  Labs:  Recent Labs  03/10/17 1622 03/11/17 0620 03/12/17 0242 03/13/17 0318  HGB 12.4 11.2* 10.2* 9.4*    Recent Labs  03/12/17 0242 03/13/17 0318  WBC 12.2* 9.2  RBC 3.47* 3.20*  HCT 32.2* 29.6*  PLT 263 236    Recent Labs  03/12/17 0242 03/13/17 0318  NA 137 138  K 3.2* 3.3*  CL 102 104  CO2 28 28  BUN 18 23*  CREATININE 0.54 0.55  GLUCOSE 157* 101*  CALCIUM 8.4* 8.2*    Recent Labs  03/11/17 0620  INR 1.05    Physical Exam:  Neurologically intact ABD soft Neurovascular intact Sensation intact distally Intact pulses distally Dorsiflexion/Plantar flexion intact Incision: no drainage No cellulitis present Compartment soft  Assessment/Plan:  2 Days Post-Op Procedure(s) (LRB): ARTHROPLASTY BIPOLAR HIP (HEMIARTHROPLASTY) AND HARDWARE REMOVAL POSTERIOR APPROACH (Left) HARDWARE REMOVAL Advance diet Up with therapy Discharge to SNF hopefully today. Continue on ASA 81mg  BID x 4 weeks for DVT prevention. Continue on tylenol only for pain. Follow up in office 2 weeks post op. Continue WBAT with posterior hip precautions.  Aminta Sakurai, Ginger OrganNDREW PAUL 03/13/2017, 9:15 AM

## 2017-03-13 NOTE — Clinical Social Work Placement (Signed)
   CLINICAL SOCIAL WORK PLACEMENT  NOTE  Date:  03/13/2017  Patient Details  Name: Jamie Carlson MRN: [id number] Date of Birth: October 28, 1918  Clinical Social Work is seeking post-discharge placement for this patient at the Skilled  Nursing Facility level of care (*CSW will initial, date and re-position this form in  chart as items are completed):  Yes   Patient/family provided with [facility] Clinical Social Work Department's list of facilities offering this level of care within the geographic area requested by the patient (or if unable, by the patient's family).  Yes   Patient/family informed of their freedom to choose among providers that offer the needed level of care, that participate in Medicare, Medicaid or managed care program needed by the patient, have an available bed and are willing to accept the patient.  Yes   Patient/family informed of [facility]'s ownership interest in [facility] and [facility], as well as of the fact that they are under no obligation to receive care at these facilities.  PASRR submitted to EDS on       PASRR number received on 03/13/17     Existing PASRR number confirmed on 03/13/17     FL2 transmitted to all facilities in geographic area requested by pt/family on 03/13/17     FL2 transmitted to all facilities within larger geographic area on 03/13/17     Patient informed that his/her managed care company has contracts with or will negotiate with certain facilities, including the following:        Yes   Patient/family informed of bed offers received.  [facility]     Physician recommends and patient chooses bed at      [facility].  Patient to be transferred to facility by [facility]     Patient family notified on 03/13/17 of transfer.  Name of family member notified:  Georgiann MohsFred Prabhu     PHYSICIAN Please prepare priority discharge summary, including medications, Please  prepare prescriptions, Please sign FL2, Please sign DNR     Additional Comment:    _______________________________________________ Tresa MoorePatricia V Marqueze Ramcharan, LCSW 03/13/2017, 12:09 PM

## 2017-03-13 NOTE — Discharge Summary (Addendum)
PATIENT DETAILS Name: Jamie Carlson Age: 83 y.o. Sex: female Date of Birth: 05/29/1918 MRN: [id number]. Admitting Physician: Michael Litter, MD WUJ:[id number], No Pcp Per  Admit Date: 03/10/2017 Discharge date: 03/13/2017  Recommendations for Outpatient Follow-up:  1. Follow up with PCP in 1-2 weeks 2. Please obtain BMP/CBC in one week 3. Please ensure follow up with orthopedics in 2 weeks.   Admitted From:  SNF  Disposition: SNF   Home Health: No  Equipment/Devices: None  Discharge Condition: Stable  CODE STATUS: DNR  Diet recommendation:  Heart Healthy  Brief Summary: See H&P, Labs, Consult and Test reports for all details in brief, Patient is a 83 y.o. female with past medical history of hypertension, mild aortic stenosis, mild dementia/cognitive dysfunction admitted following a mechanical fall-found to have a left hip fracture. Underwent left hip hemiarthroplasty on 5/5. See below for further details   Brief Hospital Course: Left hip fracture: Following a mechanical fall, she underwent left hip hemiarthroplasty on 5/5. Orthopedic service followed closely, recommendations are for aspirin 81 mg twice a day for 4 weeks for DVT prophylaxis. Recommendations for weightbearing as tolerated with posterior hip precautions. Patient will need follow-up with orthopedics in 2 weeks.  Hypertension: Blood pressure better controlled, continue with amlodipine and follow closely while at SNF.   Constipation: Colace and MiraLAX-follow.  Mild aortic stenosis: Seen on echocardiogram done last admission-stable for outpatient follow-up.  Mild dysphagia: Continue dysphagia 3 diet.  Underweight status: Continue supplements  ? Dementia: Very hard of hearing-appears to be somewhat confused-very poor historian-minimize narcotics as much as possible-supportive measures.  Procedures/Studies: LEFT HIP HEMIARTHROPLASTY AND HARDWARE REMOVAL POSTERIOR 5/5  Discharge Diagnoses:    Principal Problem:   Hip fracture [facility]) Active Problems:   Essential hypertension   Cardiac murmur   Fall   Protein-calorie malnutrition, severe   Leukocytosis   Discharge Instructions:  Activity:  WBAT WBAT with posterior hip precautions  Discharge Instructions    Call MD for:  redness, tenderness, or signs of infection (pain, swelling, redness, odor or green/yellow discharge around incision site)    Complete by:  As directed    Diet - low sodium heart healthy    Complete by:  As directed    Dysphagia 3 diet   Discharge instructions    Complete by:  As directed    Weight Bearing As Tolerated with posterior hip precautions. She should have knee immobilizer on in bed if she will allow.   Increase activity slowly    Complete by:  As directed      Allergies as of 03/13/2017      Reactions   Sulfa Antibiotics Other (See Comments)   Unknown allergic reaction   Bee Venom Rash      Medication List    TAKE these medications   acetaminophen 500 MG tablet Commonly known as:  TYLENOL Take 2 tablets (1,000 mg total) by mouth every 8 (eight) hours. What changed:  medication strength  how much to take  when to take this  reasons to take this   amLODipine 5 MG tablet Commonly known as:  NORVASC Take 1 tablet (5 mg total) by mouth daily.   aspirin 81 MG EC tablet Take 1 tablet (81 mg total) by mouth 2 (two) times daily. For 4 weeks from 03/11/17 What changed:  additional instructions   docusate sodium 100 MG capsule Commonly known as:  COLACE Take 100 mg by mouth every other day.   METAMUCIL 28.3 % Powd Generic drug:  Psyllium Take 1 scoop by mouth daily. Mix in 8 oz liquid and drink   NUTRITIONAL SUPPLEMENT Liqd Take 240 mLs by mouth 2 (two) times daily. MedPass   polyethylene glycol packet Commonly known as:  MIRALAX / GLYCOLAX Take 17 g by mouth daily as needed for mild constipation.   sennosides-docusate sodium 8.6-50 MG tablet Commonly known as:   SENOKOT-S Take 1 tablet by mouth every other day.      Follow-up Information    Marcene Corning, MD. Schedule an appointment as soon as possible for a visit in 2 week(s).   Specialty:  Orthopedic Surgery Contact information: [address] [place] [address] [phone]        Primary MD. Schedule an appointment as soon as possible for a visit in 1 week(s).          Allergies  Allergen Reactions  . Sulfa Antibiotics Other (See Comments)    Unknown allergic reaction  . Bee Venom Rash    Consultations:   orthopedic surgery  Other Procedures/Studies: Dg Chest 1 View  Result Date: 03/10/2017 CLINICAL DATA:  Pain after fall.  Preop. EXAM: CHEST 1 VIEW COMPARISON:  01/21/2017 FINDINGS: Heart size is within normal limits. There is aortic atherosclerosis. There is atelectasis at the left lung base. No overt pulmonary edema, pneumothorax nor acute osseous abnormality of the bony thorax. Degenerative changes are seen along the thoracic spine and both shoulders. IMPRESSION: Aortic atherosclerosis. Left basilar atelectasis. No acute pneumonic consolidation. Electronically Signed   By: Tollie Eth M.D.   On: 03/10/2017 18:56   Dg Tibia/fibula Right  Result Date: 03/10/2017 CLINICAL DATA:  Right leg pain after fall EXAM: RIGHT TIBIA AND FIBULA - 2 VIEW COMPARISON:  None. FINDINGS: Bones appear osteopenic. No acute displaced fracture is noted. There is no joint dislocation. Soft tissue induration is seen along the anterior aspect of the proximal tibial shaft. IMPRESSION: Soft tissue abrasion along the anterior aspect of the proximal leg. No underlying fracture. Electronically Signed   By: Tollie Eth M.D.   On: 03/10/2017 19:02   Pelvis Portable  Result Date: 03/11/2017 CLINICAL DATA:  Status post left hip hemiarthroplasty. EXAM: PORTABLE PELVIS 1-2 VIEWS COMPARISON:  01/22/2017 FINDINGS: A left hip hemiarthroplasty device is identified. The hardware components are in anatomic alignment.  No periprosthetic fracture or subluxation. Gas noted within the surrounding soft tissues. IMPRESSION: 1. No complications status post left hip hemiarthroplasty. Electronically Signed   By: Signa Kell M.D.   On: 03/11/2017 10:38   Dg Hip Unilat W Or Wo Pelvis 1 View Left  Result Date: 03/10/2017 CLINICAL DATA:  Left hip pain after fall EXAM: DG HIP (WITH OR WITHOUT PELVIS) 1V*L* COMPARISON:  01/22/2017 FINDINGS: Acute superolateral displacement of the left femoral neck and shaft relative to the femoral head since the intraoperative left femoral neck repair on 01/22/2017. Three cannulated screws are no longer imbedded within the femoral head. A true lateral view was unable to be provided to determine whether the displacement is anterior or posterior relative to the femoral head. The bony pelvis is grossly intact. No pubic rami fractures. IMPRESSION: New superolateral displacement of the left femoral neck and shaft relative to the femoral head with the tips of 3 cannulated screws no longer embedded within the left femoral head. No bony pelvic fracture is seen. Electronically Signed   By: Tollie Eth M.D.   On: 03/10/2017 19:00     TODAY-DAY OF DISCHARGE:  Subjective:   Jamie Carlson today has no  headache,no chest abdominal pain,no new weakness tingling or numbness, feels much better wants to go home today.   Objective:   Blood pressure 133/76, pulse 73, temperature 97.8 F (36.6 C), temperature source Axillary, resp. rate 16, height 5' (1.524 m), weight 32.7 kg (72 lb), SpO2 100 %.  Intake/Output Summary (Last 24 hours) at 03/13/17 9604 Last data filed at 03/13/17 0000  Gross per 24 hour  Intake              560 ml  Output               40 ml  Net              520 ml   Filed Weights   03/10/17 1540  Weight: 32.7 kg (72 lb)    Exam: Awake Alert, No new F.N deficits, Normal affect [place].AT,PERRAL Supple Neck,No JVD, No cervical lymphadenopathy appriciated.  Symmetrical Chest wall  movement, Good air movement bilaterally, CTAB RRR,No Gallops,Rubs or new Murmurs, No Parasternal Heave +ve B.Sounds, Abd Soft, Non tender, No organomegaly appriciated, No rebound -guarding or rigidity. No Cyanosis, Clubbing or edema, No new Rash or bruise   PERTINENT RADIOLOGIC STUDIES: Dg Chest 1 View  Result Date: 03/10/2017 CLINICAL DATA:  Pain after fall.  Preop. EXAM: CHEST 1 VIEW COMPARISON:  01/21/2017 FINDINGS: Heart size is within normal limits. There is aortic atherosclerosis. There is atelectasis at the left lung base. No overt pulmonary edema, pneumothorax nor acute osseous abnormality of the bony thorax. Degenerative changes are seen along the thoracic spine and both shoulders. IMPRESSION: Aortic atherosclerosis. Left basilar atelectasis. No acute pneumonic consolidation. Electronically Signed   By: Tollie Eth M.D.   On: 03/10/2017 18:56   Dg Tibia/fibula Right  Result Date: 03/10/2017 CLINICAL DATA:  Right leg pain after fall EXAM: RIGHT TIBIA AND FIBULA - 2 VIEW COMPARISON:  None. FINDINGS: Bones appear osteopenic. No acute displaced fracture is noted. There is no joint dislocation. Soft tissue induration is seen along the anterior aspect of the proximal tibial shaft. IMPRESSION: Soft tissue abrasion along the anterior aspect of the proximal leg. No underlying fracture. Electronically Signed   By: Tollie Eth M.D.   On: 03/10/2017 19:02   Pelvis Portable  Result Date: 03/11/2017 CLINICAL DATA:  Status post left hip hemiarthroplasty. EXAM: PORTABLE PELVIS 1-2 VIEWS COMPARISON:  01/22/2017 FINDINGS: A left hip hemiarthroplasty device is identified. The hardware components are in anatomic alignment. No periprosthetic fracture or subluxation. Gas noted within the surrounding soft tissues. IMPRESSION: 1. No complications status post left hip hemiarthroplasty. Electronically Signed   By: Signa Kell M.D.   On: 03/11/2017 10:38   Dg Hip Unilat W Or Wo Pelvis 1 View Left  Result Date:  03/10/2017 CLINICAL DATA:  Left hip pain after fall EXAM: DG HIP (WITH OR WITHOUT PELVIS) 1V*L* COMPARISON:  01/22/2017 FINDINGS: Acute superolateral displacement of the left femoral neck and shaft relative to the femoral head since the intraoperative left femoral neck repair on 01/22/2017. Three cannulated screws are no longer imbedded within the femoral head. A true lateral view was unable to be provided to determine whether the displacement is anterior or posterior relative to the femoral head. The bony pelvis is grossly intact. No pubic rami fractures. IMPRESSION: New superolateral displacement of the left femoral neck and shaft relative to the femoral head with the tips of 3 cannulated screws no longer embedded within the left femoral head. No bony pelvic fracture is seen. Electronically Signed  By: Tollie Ethavid  Kwon M.D.   On: 03/10/2017 19:00     PERTINENT LAB RESULTS: CBC:  Recent Labs  03/12/17 0242 03/13/17 0318  WBC 12.2* 9.2  HGB 10.2* 9.4*  HCT 32.2* 29.6*  PLT 263 236   CMET CMP     Component Value Date/Time   NA 138 03/13/2017 0318   NA 141 02/02/2017   K 3.3 (L) 03/13/2017 0318   CL 104 03/13/2017 0318   CO2 28 03/13/2017 0318   GLUCOSE 101 (H) 03/13/2017 0318   BUN 23 (H) 03/13/2017 0318   BUN 15 02/02/2017   CREATININE 0.55 03/13/2017 0318   CALCIUM 8.2 (L) 03/13/2017 0318   PROT 6.7 03/10/2017 1622   ALBUMIN 3.5 03/10/2017 1622   AST 26 03/10/2017 1622   ALT 16 03/10/2017 1622   ALKPHOS 86 03/10/2017 1622   BILITOT 0.8 03/10/2017 1622   GFRNONAA >60 03/13/2017 0318   GFRAA >60 03/13/2017 0318    GFR Estimated Creatinine Clearance: 21.2 mL/min (by C-G formula based on SCr of 0.55 mg/dL). No results for input(s): LIPASE, AMYLASE in the last 72 hours. No results for input(s): CKTOTAL, CKMB, CKMBINDEX, TROPONINI in the last 72 hours. Invalid input(s): POCBNP No results for input(s): DDIMER in the last 72 hours. No results for input(s): HGBA1C in the last 72  hours. No results for input(s): CHOL, HDL, LDLCALC, TRIG, CHOLHDL, LDLDIRECT in the last 72 hours. No results for input(s): TSH, T4TOTAL, T3FREE, THYROIDAB in the last 72 hours.  Invalid input(s): FREET3 No results for input(s): VITAMINB12, FOLATE, FERRITIN, TIBC, IRON, RETICCTPCT in the last 72 hours. Coags:  Recent Labs  03/11/17 0620  INR 1.05   Microbiology: Recent Results (from the past 240 hour(s))  Surgical pcr screen     Status: Abnormal   Collection Time: 03/10/17 10:25 PM  Result Value Ref Range Status   MRSA, PCR POSITIVE (A) NEGATIVE Final    Comment: RESULT CALLED TO, READ BACK BY AND VERIFIED WITH: DULLA,R RN [phone] AT 0145 SKEEN,P    Staphylococcus aureus POSITIVE (A) NEGATIVE Final    Comment:        The Xpert SA Assay (FDA approved for NASAL specimens in patients over 81 years of age), is one component of a comprehensive surveillance program.  Test performance has been validated by [facility] for patients greater than or equal to 81 year old. It is not intended to diagnose infection nor to guide or monitor treatment.     FURTHER DISCHARGE INSTRUCTIONS:  Get Medicines reviewed and adjusted: Please take all your medications with you for your next visit with your Primary MD  Laboratory/radiological data: Please request your Primary MD to go over all hospital tests and procedure/radiological results at the follow up, please ask your Primary MD to get all Hospital records sent to his/her office.  In some cases, they will be blood work, cultures and biopsy results pending at the time of your discharge. Please request that your primary care M.D. goes through all the records of your hospital data and follows up on these results.  Also Note the following: If you experience worsening of your admission symptoms, develop shortness of breath, life threatening emergency, suicidal or homicidal thoughts you must seek medical attention immediately by calling 911 or  calling your MD immediately  if symptoms less severe.  You must read complete instructions/literature along with all the possible adverse reactions/side effects for all the Medicines you take and that have been prescribed to you. Take any  new Medicines after you have completely understood and accpet all the possible adverse reactions/side effects.   Do not drive when taking Pain medications or sleeping medications (Benzodaizepines)  Do not take more than prescribed Pain, Sleep and Anxiety Medications. It is not advisable to combine anxiety,sleep and pain medications without talking with your primary care practitioner  Special Instructions: If you have smoked or chewed Tobacco  in the last 2 yrs please stop smoking, stop any regular Alcohol  and or any Recreational drug use.  Wear Seat belts while driving.  Please note: You were cared for by a hospitalist during your hospital stay. Once you are discharged, your primary care physician will handle any further medical issues. Please note that NO REFILLS for any discharge medications will be authorized once you are discharged, as it is imperative that you return to your primary care physician (or establish a relationship with a primary care physician if you do not have one) for your post hospital discharge needs so that they can reassess your need for medications and monitor your lab values.  Total Time spent coordinating discharge including counseling, education and face to face time equals  45 minutes.  Signed: Vyom Brass 03/13/2017 8:22 AM

## 2017-03-13 NOTE — Social Work (Addendum)
Clinical Social Worker facilitated patient discharge including contacting patient family and facility to confirm patient discharge plans.  Clinical information faxed to facility and family agreeable with plan.  CSW arranged [facility].  RN to call [phone] report prior to discharge. [facility]  Clinical Social Worker will sign off for now as social work intervention is no longer needed. Please consult us again if new need arises.  Keene BreathPatricia Kasaundra Fahrney, LCSW Clinical Social Worker [phone]

## 2017-03-13 NOTE — Progress Notes (Signed)
Discharge order received, social worker notified RN that transport will be set up through [facility] around 1300 today. RN called report to Alcario Droughtanika, Charity fundraiserN [facility]. Alcario Droughtanika, RN verbalized understanding. Patient's IV previously removed on another shift. Patient's son previously removed patient's belongings from room, patient will be transported in hospital gown. Patient and her son that is at bedside notified that RN has called report to [facility] and that [facility] will come to patient's room to transport her to SNF.

## 2017-03-13 NOTE — [place] FL2 (Signed)
[place] MEDICAID FL2 LEVEL OF CARE SCREENING TOOL     IDENTIFICATION  Patient Name: Jamie Carlson Birthdate: August 24, 1918 Sex: female Admission Date (Current Location): 03/10/2017  [facility] [place]   Facility and Address:  [facility] [place], [address], [place], [place] [address]      Provider Number: [id number]  Attending Physician Name and Address:  Maretta BeesGhimire, Shanker M, MD  Relative Name and Phone Number:       Current Level of Care: Hospital Recommended Level of Care: Skilled Nursing Facility Prior Approval Number:    Date Approved/Denied: 03/13/17 PASRR Number: [id number]  Discharge Plan: SNF    Current Diagnoses: Patient Active Problem List   Diagnosis Date Noted  . Hip fracture (HCC) 03/10/2017  . Leukocytosis 03/10/2017  . Constipation 01/26/2017  . Protein-calorie malnutrition, severe 01/24/2017  . Essential hypertension 01/22/2017  . Hypertensive urgency 01/22/2017  . Hypokalemia 01/22/2017  . Closed subcapital fracture of femur, left, initial encounter (HCC) 01/22/2017  . Cardiac murmur 01/22/2017  . Cystitis   . Fall     Orientation RESPIRATION BLADDER Height & Weight     Self  Normal Incontinent Weight: 72 lb (32.7 kg) Height:  5' (152.4 cm)  BEHAVIORAL SYMPTOMS/MOOD NEUROLOGICAL BOWEL NUTRITION STATUS      Incontinent Diet (See Dc Summary)  AMBULATORY STATUS COMMUNICATION OF NEEDS Skin   Limited Assist Verbally Surgical wounds (Incision closed left hip with silver hydro fiber)                       Personal Care Assistance Level of Assistance  Bathing, Feeding, Dressing Bathing Assistance: Limited assistance Feeding assistance: Limited assistance Dressing Assistance: Limited assistance     Functional Limitations Info  Hearing, Sight, Speech Sight Info: Adequate Hearing Info: Impaired Speech Info: Adequate    SPECIAL CARE FACTORS FREQUENCY  PT (By licensed PT)     PT Frequency:  5xweek OT Frequency: 5xweek            Contractures      Additional Factors Info  Code Status, Allergies, Isolation Precautions Code Status Info: DNR Allergies Info: SULFA ANTIBIOTICS, BEE VENOM      Isolation Precautions Info: MRSA     Current Medications (03/13/2017):  This is the current hospital active medication list Current Facility-Administered Medications  Medication Dose Route Frequency Provider Last Rate Last Dose  . acetaminophen (TYLENOL) solution 1,000 mg  1,000 mg Oral Q8H Ghimire, Werner LeanShanker M, MD   1,000 mg at 03/13/17 0535  . amLODipine (NORVASC) tablet 5 mg  5 mg Oral Daily Maretta BeesGhimire, Shanker M, MD   5 mg at 03/13/17 [id number]  . aspirin EC tablet 81 mg  81 mg Oral BID Michael Litterarter, Nikki, MD   81 mg at 03/13/17 [id number]  . bupivacaine liposome (EXPAREL) 1.3 % injection 266 mg  20 mL Infiltration Once Marcene Corningalldorf, Peter, MD      . Chlorhexidine Gluconate Cloth 2 % PADS 6 each  6 each Topical Q0600 Magnus Ivanriadhosp, McAdmits, MD   6 each at 03/13/17 [id number]  . docusate sodium (COLACE) capsule 100 mg  100 mg Oral QHS Michael Litterarter, Nikki, MD   100 mg at 03/12/17 2153  . feeding supplement (ENSURE ENLIVE) (ENSURE ENLIVE) liquid 237 mL  237 mL Oral TID BM Ghimire, Werner LeanShanker M, MD   237 mL at 03/13/17 1000  . MEDLINE mouth rinse  15 mL Mouth Rinse BID Ghimire, Werner LeanShanker M, MD   15 mL at  03/13/17 1000  . menthol-cetylpyridinium (CEPACOL) lozenge 3 mg  1 lozenge Oral PRN Elodia Florence, PA-C       Or  . phenol (CHLORASEPTIC) mouth spray 1 spray  1 spray Mouth/Throat PRN Elodia Florence, PA-C      . metoCLOPramide (REGLAN) tablet 5-10 mg  5-10 mg Oral Q8H PRN Elodia Florence, PA-C       Or  . metoCLOPramide (REGLAN) injection 5-10 mg  5-10 mg Intravenous Q8H PRN Elodia Florence, PA-C      . mupirocin ointment (BACTROBAN) 2 % 1 application  1 application Nasal BID Magnus Ivan, MD   1 application at 03/13/17 [phone]  . ondansetron (ZOFRAN) tablet 4 mg  4 mg Oral Q6H PRN Elodia Florence, PA-C       Or  . ondansetron (ZOFRAN)  injection 4 mg  4 mg Intravenous Q6H PRN Elodia Florence, PA-C      . polyethylene glycol (MIRALAX / GLYCOLAX) packet 17 g  17 g Oral Daily Maretta Bees, MD   17 g at 03/13/17 1000  . psyllium (HYDROCIL/METAMUCIL) packet 1 packet  1 packet Oral Gigi Gin, MD   1 packet at 03/13/17 1000  . senna-docusate (Senokot-S) tablet 1 tablet  1 tablet Oral Gigi Gin, MD   1 tablet at 03/12/17 0013     Discharge Medications: Please see discharge summary for a list of discharge medications.  Relevant Imaging Results:  Relevant Lab Results:   Additional Information YN:[id number]  Tresa Moore, LCSW

## 2017-03-15 ENCOUNTER — Non-Acute Institutional Stay (SKILLED_NURSING_FACILITY): Payer: Medicare Other | Admitting: Internal Medicine

## 2017-03-15 ENCOUNTER — Encounter: Payer: Self-pay | Admitting: Internal Medicine

## 2017-03-15 DIAGNOSIS — E876 Hypokalemia: Secondary | ICD-10-CM

## 2017-03-15 DIAGNOSIS — E43 Unspecified severe protein-calorie malnutrition: Secondary | ICD-10-CM | POA: Diagnosis not present

## 2017-03-15 DIAGNOSIS — D500 Iron deficiency anemia secondary to blood loss (chronic): Secondary | ICD-10-CM

## 2017-03-15 DIAGNOSIS — R1310 Dysphagia, unspecified: Secondary | ICD-10-CM

## 2017-03-15 DIAGNOSIS — S72002S Fracture of unspecified part of neck of left femur, sequela: Secondary | ICD-10-CM

## 2017-03-15 DIAGNOSIS — K5909 Other constipation: Secondary | ICD-10-CM

## 2017-03-15 DIAGNOSIS — R2681 Unsteadiness on feet: Secondary | ICD-10-CM | POA: Diagnosis not present

## 2017-03-15 DIAGNOSIS — I10 Essential (primary) hypertension: Secondary | ICD-10-CM

## 2017-03-15 NOTE — Progress Notes (Signed)
LOCATION: [facility]  PCP: Patient, No Pcp Per   Code Status: DNR  Goals of care: Advanced Directive information Advanced Directives 03/15/2017  Does Patient Have a Medical Advance Directive? Yes  Type of Advance Directive Out of facility DNR (pink MOST or yellow form)  Does patient want to make changes to medical advance directive? No - Patient declined  Would patient like information on creating a medical advance directive? -       Extended Emergency Contact Information Primary Emergency Contact: Raffel,[name] [address]          [place] [id number] [place] Home Phone: [phone] Mobile Phone: [phone] Relation: Son   Allergies  Allergen Reactions  . Sulfa Antibiotics Other (See Comments)    Unknown allergic reaction  . Bee Venom Rash    Chief Complaint  Patient presents with  . New Admit To SNF    New Admission Visit      HPI:  Patient is a 83 y.o. female seen today for short term rehabilitation post hospital admission from 03/10/17-03/13/17 post fall with left hip fracture. She underwent left hip hemiarthroplasty. She has medical history of HTN,  Aortic stenosis, dementia among others and is seen in her room today.   Review of Systems:  Constitutional: Negative for fever, chills, diaphoresis.  HENT: Negative for headache, congestion sore throat. Positive for clear nasal discharge, dry mouth and difficulty swallowing food with feeling of it being stuck.   Eyes: Negative for eye pain, blurred vision, double vision and discharge.  Respiratory: Negative for shortness of breath and wheezing. Positive for cough with white phlegm.   Cardiovascular: Negative for chest pain, palpitations, leg swelling.  Gastrointestinal: Negative for heartburn, nausea, vomiting, abdominal pain, loss of appetite, melena, diarrhea and constipation. Last bowel movement was last night. Genitourinary: Negative for dysuria and flank pain. positive for  increased urinary frequency. Musculoskeletal: Negative for back pain, fall in the facility. Positive for pain to left leg with movement. Skin: Negative for itching, rash.  Neurological: Negative for dizziness. Psychiatric/Behavioral: Negative for depression.   Past Medical History:  Diagnosis Date  . Cardiac murmur   . Closed subcapital fracture of femur, left, initial encounter (HCC)   . Hypertension   . Hypertensive urgency   . Hypokalemia   . Protein-calorie malnutrition, severe (HCC)   . Urinary tract infection    Past Surgical History:  Procedure Laterality Date  . CESAREAN SECTION    . HARDWARE REMOVAL  03/11/2017   Procedure: HARDWARE REMOVAL;  Surgeon: Marcene Corningalldorf, Peter, MD;  [facility];  Service: Orthopedics;;  . HIP ARTHROPLASTY Left 03/11/2017   Procedure: ARTHROPLASTY BIPOLAR HIP (HEMIARTHROPLASTY) AND HARDWARE REMOVAL POSTERIOR APPROACH;  Surgeon: Marcene Corningalldorf, Peter, MD;  Location: [facility] OR;  Service: Orthopedics;  Laterality: Left;  . HIP PINNING,CANNULATED Left 01/22/2017   Procedure: CANNULATED HIP PINNING;  Surgeon: Jones BroomJustin Chandler, MD;  Location: [facility] OR;  Service: Orthopedics;  Laterality: Left;   Social History:   reports that she has never smoked. She has never used smokeless tobacco. She reports that she does not drink alcohol or use drugs.  Family History  Problem Relation Age of Onset  . Family history unknown: Yes    Medications: Allergies as of 03/15/2017      Reactions   Sulfa Antibiotics Other (See Comments)   Unknown allergic reaction   Bee Venom Rash      Medication List       Accurate as of 03/15/17 12:56 PM.  Always use your most recent med list.          acetaminophen 500 MG tablet Commonly known as:  TYLENOL Take 2 tablets (1,000 mg total) by mouth every 8 (eight) hours.   amLODipine 5 MG tablet Commonly known as:  NORVASC Take 1 tablet (5 mg total) by mouth daily.   aspirin 81 MG EC tablet Take 1 tablet (81 mg total) by mouth 2 (two)  times daily. For 4 weeks from 03/11/17   docusate sodium 100 MG capsule Commonly known as:  COLACE Take 100 mg by mouth every other day.   METAMUCIL 28.3 % Powd Generic drug:  Psyllium Take 1 scoop by mouth daily. Mix in 8 oz liquid and drink   NUTRITIONAL SUPPLEMENT Liqd Take 240 mLs by mouth 2 (two) times daily. MedPass   polyethylene glycol packet Commonly known as:  MIRALAX / GLYCOLAX Take 17 g by mouth daily as needed for mild constipation.   sennosides-docusate sodium 8.6-50 MG tablet Commonly known as:  SENOKOT-S Take 1 tablet by mouth every other day.       Immunizations: Immunization History  Administered Date(s) Administered  . PPD Test 01/25/2017     Physical Exam: Vitals:   03/15/17 1248  BP: 130/78  Pulse: 95  Resp: 18  Temp: 97.8 F (36.6 C)  TempSrc: Oral  SpO2: 95%  Weight: 70 lb 3.2 oz (31.8 kg)  Height: 4\' 9"  (1.448 m)   Body mass index is 15.19 kg/m.  General- elderly female, frail and thin built, in no acute distress Head- normocephalic, atraumatic Nose- no maxillary or frontal sinus tenderness, no nasal discharge Throat- moist mucus membrane, normal oropharynx, edentulous Eyes- PERRLA, EOMI, no pallor, no icterus, no discharge, normal conjunctiva, normal sclera Neck- no cervical lymphadenopathy Cardiovascular- normal s1,s2, + systolic murmur Respiratory- bilateral clear to auscultation, no wheeze, no rhonchi, no crackles, no use of accessory muscles Abdomen- bowel sounds present, soft, non tender, no guarding or rigidity Musculoskeletal- able to move all 4 extremities, limited left leg range of motion Neurological- alert and oriented to person, place and time Skin- warm and dry, left hip surgical incision with glue, appears clean and dry Psychiatry- normal mood and affect    Labs reviewed: Basic Metabolic Panel:  Recent Labs  96/04/54 0620 03/12/17 0242 03/13/17 0318  NA 140 137 138  K 3.8 3.2* 3.3*  CL 102 102 104  CO2 30  28 28   GLUCOSE 94 157* 101*  BUN 27* 18 23*  CREATININE 0.56 0.54 0.55  CALCIUM 8.6* 8.4* 8.2*   Liver Function Tests:  Recent Labs  01/21/17 2303 03/10/17 1622  AST 24 26  ALT 21 16  ALKPHOS 83 86  BILITOT 0.8 0.8  PROT 7.4 6.7  ALBUMIN 4.3 3.5   No results for input(s): LIPASE, AMYLASE in the last 8760 hours. No results for input(s): AMMONIA in the last 8760 hours. CBC:  Recent Labs  01/21/17 2303  03/11/17 0620 03/12/17 0242 03/13/17 0318  WBC 12.5*  < > 7.8 12.2* 9.2  NEUTROABS 10.7*  --   --   --   --   HGB 13.9  < > 11.2* 10.2* 9.4*  HCT 40.8  < > 36.4 32.2* 29.6*  MCV 89.7  < > 93.6 92.8 92.5  PLT 259  < > 252 263 236  < > = values in this interval not displayed. Cardiac Enzymes: No results for input(s): CKTOTAL, CKMB, CKMBINDEX, TROPONINI in the last 8760 hours. BNP: Invalid input(s): POCBNP  CBG: No results for input(s): GLUCAP in the last 8760 hours.  Radiological Exams: Dg Chest 1 View  Result Date: 03/10/2017 CLINICAL DATA:  Pain after fall.  Preop. EXAM: CHEST 1 VIEW COMPARISON:  01/21/2017 FINDINGS: Heart size is within normal limits. There is aortic atherosclerosis. There is atelectasis at the left lung base. No overt pulmonary edema, pneumothorax nor acute osseous abnormality of the bony thorax. Degenerative changes are seen along the thoracic spine and both shoulders. IMPRESSION: Aortic atherosclerosis. Left basilar atelectasis. No acute pneumonic consolidation. Electronically Signed   By: Tollie Eth M.D.   On: 03/10/2017 18:56   Dg Tibia/fibula Right  Result Date: 03/10/2017 CLINICAL DATA:  Right leg pain after fall EXAM: RIGHT TIBIA AND FIBULA - 2 VIEW COMPARISON:  None. FINDINGS: Bones appear osteopenic. No acute displaced fracture is noted. There is no joint dislocation. Soft tissue induration is seen along the anterior aspect of the proximal tibial shaft. IMPRESSION: Soft tissue abrasion along the anterior aspect of the proximal leg. No underlying  fracture. Electronically Signed   By: Tollie Eth M.D.   On: 03/10/2017 19:02   Pelvis Portable  Result Date: 03/11/2017 CLINICAL DATA:  Status post left hip hemiarthroplasty. EXAM: PORTABLE PELVIS 1-2 VIEWS COMPARISON:  01/22/2017 FINDINGS: A left hip hemiarthroplasty device is identified. The hardware components are in anatomic alignment. No periprosthetic fracture or subluxation. Gas noted within the surrounding soft tissues. IMPRESSION: 1. No complications status post left hip hemiarthroplasty. Electronically Signed   By: Signa Kell M.D.   On: 03/11/2017 10:38   Dg Hip Unilat W Or Wo Pelvis 1 View Left  Result Date: 03/10/2017 CLINICAL DATA:  Left hip pain after fall EXAM: DG HIP (WITH OR WITHOUT PELVIS) 1V*L* COMPARISON:  01/22/2017 FINDINGS: Acute superolateral displacement of the left femoral neck and shaft relative to the femoral head since the intraoperative left femoral neck repair on 01/22/2017. Three cannulated screws are no longer imbedded within the femoral head. A true lateral view was unable to be provided to determine whether the displacement is anterior or posterior relative to the femoral head. The bony pelvis is grossly intact. No pubic rami fractures. IMPRESSION: New superolateral displacement of the left femoral neck and shaft relative to the femoral head with the tips of 3 cannulated screws no longer embedded within the left femoral head. No bony pelvic fracture is seen. Electronically Signed   By: Tollie Eth M.D.   On: 03/10/2017 19:00    Assessment/Plan  Unsteady gait Will have her work with physical therapy and occupational therapy team to help with gait training and muscle strengthening exercises.fall precautions. Skin care. Encourage to be out of bed.   Left hip fracture s/p left hemiarthroplasty. Has orthopedic follow up. Will continue aspirin 81 mg bid x 4 weeks for dvt prophylaxis. Will have patient work with PT/OT as tolerated to regain strength and restore  function.  Fall precautions are in place. Continue tylenol 1000 mg tid for pain. PMR consult.   Blood loss anemia Post op, monitor cbc  Dysphagia Continue mechanical soft diet with thin liquids and SLP to follow, aspiration precautions  Hypokalemia Monitor BMP  Constipation Continue daily metamucil, colace 100 mg qod and senokot qod with miralax on need basis, encouraged hydration  HTN Continue norvasc and monitor BP  Severe protein calorie malnutrition Supportive care, continue medpass supplement, monitor weekly weight   Goals of care: short term rehabilitation   Labs/tests ordered: cbc, bmp 03/20/17  Family/ staff Communication: reviewed care plan with  patient and nursing supervisor    Oneal Grout, MD Internal Medicine [facility] [address] [place], [place] [address] Cell Phone (Monday-Friday 8 am - 5 pm): [phone] On Call: [phone] and follow prompts after 5 pm and on weekends Office Phone: [phone] Office Fax: [phone]

## 2017-03-17 ENCOUNTER — Non-Acute Institutional Stay (SKILLED_NURSING_FACILITY): Payer: Medicare Other | Admitting: Internal Medicine

## 2017-03-17 ENCOUNTER — Encounter: Payer: Self-pay | Admitting: Internal Medicine

## 2017-03-17 DIAGNOSIS — R1310 Dysphagia, unspecified: Secondary | ICD-10-CM | POA: Diagnosis not present

## 2017-03-17 DIAGNOSIS — K143 Hypertrophy of tongue papillae: Secondary | ICD-10-CM

## 2017-03-17 NOTE — Progress Notes (Signed)
LOCATION: [facility]  PCP: Patient, No Pcp Per   Code Status: DNR  Goals of care: Advanced Directive information Advanced Directives 03/15/2017  Does Patient Have a Medical Advance Directive? Yes  Type of Advance Directive Out of facility DNR (pink MOST or yellow form)  Does patient want to make changes to medical advance directive? No - Patient declined  Would patient like information on creating a medical advance directive? -       Extended Emergency Contact Information Primary Emergency Contact: Kachel,[name] Address: [address]          [place] [id number] [place] Home Phone: [phone] Mobile Phone: [phone] Relation: Son   Allergies  Allergen Reactions  . Sulfa Antibiotics Other (See Comments)    Unknown allergic reaction  . Bee Venom Rash    Chief Complaint  Patient presents with  . Acute Visit    Oral thrush     HPI:  Patient is a 83 y.o. female seen today for acute visit with concerns for oral thrush. She is seen in her room. she denies any mouth pain and sores to her mouth. Denies difficulty swallowing. She is here fir short term rehabilitation post hospital admission from 03/10/17-03/13/17 post fall with left hip fracture s/p hemiarthroplasty.   Review of Systems:  Constitutional: Negative for fever, chills HENT: Negative for headache, congestion, sore throat.  Respiratory: Negative for shortness of breath  Cardiovascular: Negative for chest pain  Gastrointestinal: Negative for heartburn, nausea, vomiting, abdominal pain Genitourinary: Negative for dysuria Skin: Negative for itching, rash.  Neurological: Negative for dizziness.   Past Medical History:  Diagnosis Date  . Cardiac murmur   . Closed subcapital fracture of femur, left, initial encounter (HCC)   . Hypertension   . Hypertensive urgency   . Hypokalemia   . Protein-calorie malnutrition, severe (HCC)   . Urinary tract infection    Past Surgical History:    Procedure Laterality Date  . CESAREAN SECTION    . HARDWARE REMOVAL  03/11/2017   Procedure: HARDWARE REMOVAL;  Surgeon: Marcene Corningalldorf, Peter, MD;  [facility];  Service: Orthopedics;;  . HIP ARTHROPLASTY Left 03/11/2017   Procedure: ARTHROPLASTY BIPOLAR HIP (HEMIARTHROPLASTY) AND HARDWARE REMOVAL POSTERIOR APPROACH;  Surgeon: Marcene Corningalldorf, Peter, MD;  Location: [facility] OR;  Service: Orthopedics;  Laterality: Left;  . HIP PINNING,CANNULATED Left 01/22/2017   Procedure: CANNULATED HIP PINNING;  Surgeon: Jones BroomJustin Chandler, MD;  Location: [facility] OR;  Service: Orthopedics;  Laterality: Left;   Social History:   reports that she has never smoked. She has never used smokeless tobacco. She reports that she does not drink alcohol or use drugs.  Family History  Problem Relation Age of Onset  . Family history unknown: Yes    Medications: Allergies as of 03/17/2017      Reactions   Sulfa Antibiotics Other (See Comments)   Unknown allergic reaction   Bee Venom Rash      Medication List       Accurate as of 03/17/17 10:45 AM. Always use your most recent med list.          acetaminophen 500 MG tablet Commonly known as:  TYLENOL Take 2 tablets (1,000 mg total) by mouth every 8 (eight) hours.   amLODipine 5 MG tablet Commonly known as:  NORVASC Take 1 tablet (5 mg total) by mouth daily.   aspirin 81 MG EC tablet Take 1 tablet (81 mg total) by mouth 2 (two) times daily. For 4 weeks from 03/11/17  docusate sodium 100 MG capsule Commonly known as:  COLACE Take 100 mg by mouth every other day.   METAMUCIL 28.3 % Powd Generic drug:  Psyllium Take 1 scoop by mouth daily. Mix in 8 oz liquid and drink   NUTRITIONAL SUPPLEMENT Liqd Take 240 mLs by mouth 2 (two) times daily. MedPass   polyethylene glycol packet Commonly known as:  MIRALAX / GLYCOLAX Take 17 g by mouth daily as needed for mild constipation.   sennosides-docusate sodium 8.6-50 MG tablet Commonly known as:  SENOKOT-S Take 1 tablet by mouth  every other day.       Immunizations: Immunization History  Administered Date(s) Administered  . PPD Test 01/25/2017     Physical Exam: Vitals:   03/17/17 1042  BP: 131/81  Pulse: 95  Resp: 18  Temp: 98.1 F (36.7 C)  TempSrc: Oral  SpO2: 96%  Weight: 77 lb (34.9 kg)  Height: 4\' 9"  (1.448 m)   Body mass index is 16.66 kg/m.  General- elderly female, frail and thin built Head- normocephalic, atraumatic Nose- no nasal discharge Throat- moist mucus membrane, thick coating to her tongue, no visible mouth sores or thrush, edentulous Eyes- PERRLA, EOMI, no pallor, no icterus, no discharge, normal conjunctiva, normal sclera Neck- no cervical lymphadenopathy Cardiovascular- normal s1,s2, + systolic murmur Respiratory- bilateral clear to auscultation Abdomen- bowel sounds present, soft, non tender Musculoskeletal- able to move all 4 extremities, limited left leg range of motion, using a walker Neurological- alert and oriented     Labs reviewed: Basic Metabolic Panel:  Recent Labs  16/10/96 0620 03/12/17 0242 03/13/17 0318  NA 140 137 138  K 3.8 3.2* 3.3*  CL 102 102 104  CO2 30 28 28   GLUCOSE 94 157* 101*  BUN 27* 18 23*  CREATININE 0.56 0.54 0.55  CALCIUM 8.6* 8.4* 8.2*   Liver Function Tests:  Recent Labs  01/21/17 2303 03/10/17 1622  AST 24 26  ALT 21 16  ALKPHOS 83 86  BILITOT 0.8 0.8  PROT 7.4 6.7  ALBUMIN 4.3 3.5   No results for input(s): LIPASE, AMYLASE in the last 8760 hours. No results for input(s): AMMONIA in the last 8760 hours. CBC:  Recent Labs  01/21/17 2303  03/11/17 0620 03/12/17 0242 03/13/17 0318  WBC 12.5*  < > 7.8 12.2* 9.2  NEUTROABS 10.7*  --   --   --   --   HGB 13.9  < > 11.2* 10.2* 9.4*  HCT 40.8  < > 36.4 32.2* 29.6*  MCV 89.7  < > 93.6 92.8 92.5  PLT 259  < > 252 263 236  < > = values in this interval not displayed. Cardiac Enzymes: No results for input(s): CKTOTAL, CKMB, CKMBINDEX, TROPONINI in the last 8760  hours. BNP: Invalid input(s): POCBNP CBG: No results for input(s): GLUCAP in the last 8760 hours.  Radiological Exams: Dg Chest 1 View  Result Date: 03/10/2017 CLINICAL DATA:  Pain after fall.  Preop. EXAM: CHEST 1 VIEW COMPARISON:  01/21/2017 FINDINGS: Heart size is within normal limits. There is aortic atherosclerosis. There is atelectasis at the left lung base. No overt pulmonary edema, pneumothorax nor acute osseous abnormality of the bony thorax. Degenerative changes are seen along the thoracic spine and both shoulders. IMPRESSION: Aortic atherosclerosis. Left basilar atelectasis. No acute pneumonic consolidation. Electronically Signed   By: Tollie Eth M.D.   On: 03/10/2017 18:56   Dg Tibia/fibula Right  Result Date: 03/10/2017 CLINICAL DATA:  Right leg pain after fall  EXAM: RIGHT TIBIA AND FIBULA - 2 VIEW COMPARISON:  None. FINDINGS: Bones appear osteopenic. No acute displaced fracture is noted. There is no joint dislocation. Soft tissue induration is seen along the anterior aspect of the proximal tibial shaft. IMPRESSION: Soft tissue abrasion along the anterior aspect of the proximal leg. No underlying fracture. Electronically Signed   By: Tollie Eth M.D.   On: 03/10/2017 19:02   Pelvis Portable  Result Date: 03/11/2017 CLINICAL DATA:  Status post left hip hemiarthroplasty. EXAM: PORTABLE PELVIS 1-2 VIEWS COMPARISON:  01/22/2017 FINDINGS: A left hip hemiarthroplasty device is identified. The hardware components are in anatomic alignment. No periprosthetic fracture or subluxation. Gas noted within the surrounding soft tissues. IMPRESSION: 1. No complications status post left hip hemiarthroplasty. Electronically Signed   By: Signa Kell M.D.   On: 03/11/2017 10:38   Dg Hip Unilat W Or Wo Pelvis 1 View Left  Result Date: 03/10/2017 CLINICAL DATA:  Left hip pain after fall EXAM: DG HIP (WITH OR WITHOUT PELVIS) 1V*L* COMPARISON:  01/22/2017 FINDINGS: Acute superolateral displacement of the  left femoral neck and shaft relative to the femoral head since the intraoperative left femoral neck repair on 01/22/2017. Three cannulated screws are no longer imbedded within the femoral head. A true lateral view was unable to be provided to determine whether the displacement is anterior or posterior relative to the femoral head. The bony pelvis is grossly intact. No pubic rami fractures. IMPRESSION: New superolateral displacement of the left femoral neck and shaft relative to the femoral head with the tips of 3 cannulated screws no longer embedded within the left femoral head. No bony pelvic fracture is seen. Electronically Signed   By: Tollie Eth M.D.   On: 03/10/2017 19:00    Assessment/Plan  Coated tongue To provide oral hygiene q shift. Will provide chlorhexidine mouth wash qid for now and monitor for signs of infection.  Dysphagia Continue mechanical soft diet with thin liquids and SLP to follow, aspiration precautions   Family/ staff Communication: reviewed care plan with patient and nursing supervisor    Oneal Grout, MD Internal Medicine [facility] [address] [place], [place] [address] Cell Phone (Monday-Friday 8 am - 5 pm): [phone] On Call: [phone] and follow prompts after 5 pm and on weekends Office Phone: [phone] Office Fax: [phone]

## 2017-03-20 LAB — CBC AND DIFFERENTIAL
HCT: 30 % — AB (ref 36–46)
Hemoglobin: 9.8 g/dL — AB (ref 12.0–16.0)
PLATELETS: 422 10*3/uL — AB (ref 150–399)
WBC: 7.9 10*3/mL

## 2017-03-20 LAB — BASIC METABOLIC PANEL
BUN: 18 mg/dL (ref 4–21)
CREATININE: 0.5 mg/dL (ref 0.5–1.1)
GLUCOSE: 80 mg/dL
Potassium: 4.3 mmol/L (ref 3.4–5.3)
Sodium: 142 mmol/L (ref 137–147)

## 2017-03-22 DIAGNOSIS — D649 Anemia, unspecified: Secondary | ICD-10-CM | POA: Diagnosis not present

## 2017-03-22 DIAGNOSIS — Z96642 Presence of left artificial hip joint: Secondary | ICD-10-CM | POA: Diagnosis not present

## 2017-03-22 DIAGNOSIS — R2681 Unsteadiness on feet: Secondary | ICD-10-CM | POA: Diagnosis not present

## 2017-03-22 DIAGNOSIS — M25552 Pain in left hip: Secondary | ICD-10-CM | POA: Diagnosis not present

## 2017-03-22 DIAGNOSIS — R1310 Dysphagia, unspecified: Secondary | ICD-10-CM | POA: Diagnosis not present

## 2017-03-23 DIAGNOSIS — R1310 Dysphagia, unspecified: Secondary | ICD-10-CM | POA: Diagnosis not present

## 2017-03-23 DIAGNOSIS — Z96642 Presence of left artificial hip joint: Secondary | ICD-10-CM | POA: Diagnosis not present

## 2017-03-23 DIAGNOSIS — R2681 Unsteadiness on feet: Secondary | ICD-10-CM | POA: Diagnosis not present

## 2017-03-23 DIAGNOSIS — D649 Anemia, unspecified: Secondary | ICD-10-CM | POA: Diagnosis not present

## 2017-03-23 DIAGNOSIS — M25552 Pain in left hip: Secondary | ICD-10-CM | POA: Diagnosis not present

## 2017-03-30 ENCOUNTER — Encounter: Payer: Self-pay | Admitting: Adult Health

## 2017-03-30 DIAGNOSIS — Z96642 Presence of left artificial hip joint: Secondary | ICD-10-CM | POA: Diagnosis not present

## 2017-03-30 DIAGNOSIS — R1310 Dysphagia, unspecified: Secondary | ICD-10-CM | POA: Diagnosis not present

## 2017-03-30 DIAGNOSIS — M25552 Pain in left hip: Secondary | ICD-10-CM | POA: Diagnosis not present

## 2017-03-30 DIAGNOSIS — D649 Anemia, unspecified: Secondary | ICD-10-CM | POA: Diagnosis not present

## 2017-03-30 DIAGNOSIS — R2681 Unsteadiness on feet: Secondary | ICD-10-CM | POA: Diagnosis not present

## 2017-03-30 NOTE — Progress Notes (Signed)
  DATE:  03/30/2017 MRN:  [id number]  BIRTHDAY: 02/18/1918  Facility:  Nursing Home Location:  [facility]  Nursing Home Room Number: 1002-A  LEVEL OF CARE:  SNF (31)  Contact Information    Name Relation Home Work Mobile   Demos,[name] Son [phone]  [phone]       Code Status History    Date Active Date Inactive Code Status Order ID Comments User Context   03/10/2017  7:43 PM 03/13/2017  4:56 PM DNR [id number]  Carter, Nikki, MD ED   01/22/2017  3:39 PM 01/25/2017  6:13 PM DNR [id number]  Elmahi, Mutaz, MD Inpatient   01/22/2017 12:35 AM 01/22/2017  3:39 PM Full Code [id number]  Opyd, Timothy S, MD ED    Questions for Most Recent Historical Code Status (Order [id number])    Question Answer Comment   In the event of cardiac or respiratory ARREST Do not call a "code blue"    In the event of cardiac or respiratory ARREST Do not perform Intubation, CPR, defibrillation or ACLS    In the event of cardiac or respiratory ARREST Use medication by any route, position, wound care, and other measures to relive pain and suffering. May use oxygen, suction and manual treatment of airway obstruction as needed for comfort.         Advance Directive Documentation     Most Recent Value  Type of Advance Directive  Out of facility DNR (pink MOST or yellow form)  Pre-existing out of facility DNR order (yellow form or pink MOST form)  -  "MOST" Form in Place?  -       Chief Complaint  Patient presents with  . Discharge Note    HISTORY OF PRESENT ILLNESS:  This is a 89-YO female seen for a discharge visit.  She will discharge home on 04/02/2017 with home health for PT, OT, and social work services.      PAST MEDICAL HISTORY:  Past Medical History:  Diagnosis Date  . Cardiac murmur   . Closed subcapital fracture of femur, left, initial encounter (HCC)   . Hypertension   . Hypertensive urgency   . Hypokalemia   . Protein-calorie malnutrition, severe (HCC)   . Urinary  tract infection      CURRENT MEDICATIONS: Reviewed  Patient's Medications  New Prescriptions   No medications on file  Previous Medications   ACETAMINOPHEN (TYLENOL) 500 MG TABLET    Take 2 tablets (1,000 mg total) by mouth every 8 (eight) hours.   AMLODIPINE (NORVASC) 5 MG TABLET    Take 1 tablet (5 mg total) by mouth daily.   ASPIRIN EC 81 MG EC TABLET    Take 1 tablet (81 mg total) by mouth 2 (two) times daily. For 4 weeks from 03/11/17   CHLORHEXIDINE GLUCONATE, MEDLINE KIT, (PERIDEX) 0.12 % SOLUTION    Use as directed 15 mLs in the mouth or throat 3 (three) times daily. Swish and spit x2 weeks   DOCUSATE SODIUM (COLACE) 100 MG CAPSULE    Take 100 mg by mouth every other day.    NUTRITIONAL SUPPLEMENT LIQD    Take 240 mLs by mouth 2 (two) times daily. Med[employer]   POLYETHYLENE GLYCOL (MIRALAX / GLYCOLAX) PACKET    Take 17 g by mouth daily as needed for mild constipation.   PSYLLIUM (METAMUCIL) 28.3 % POWD    Take 1 scoop by mouth daily. Mix in 8 oz liquid and drink   SENNOSIDES-DOCUSATE SODIUM (SENOKOT-S) 8.6-50   MG TABLET    Take 1 tablet by mouth every other day.  Modified Medications   No medications on file  Discontinued Medications   No medications on file     Allergies  Allergen Reactions  . Sulfa Antibiotics Other (See Comments)    Unknown allergic reaction  . Bee Venom Rash     REVIEW OF SYSTEMS:  GENERAL: no change in appetite, no fatigue, no weight changes, no fever, chills or weakness SKIN: Denies rash, itching, wounds, ulcer sores, or nail abnormality EYES: Denies change in vision, dry eyes, eye pain, itching or discharge EARS: Denies change in hearing, ringing in ears, or earache NOSE: Denies nasal congestion or epistaxis MOUTH and THROAT: Denies oral discomfort, gingival pain or bleeding, pain from teeth or hoarseness   RESPIRATORY: no cough, SOB, DOE, wheezing, hemoptysis CARDIAC: no chest pain, edema or palpitations GI: no abdominal pain, diarrhea,  constipation, heart burn, nausea or vomiting GU: Denies dysuria, frequency, hematuria, incontinence, or discharge MUSCULOSKELETAL: Denies joit pain, muscle pain, back pain, restricted movement, or unusual weakness CIRCULATION: Denies claudication, edema of legs, varicosities, or cold extremities NEUROLOGICAL: Denies dizziness, syncope, numbness, or headache PSYCHIATRIC: Denies feeling of depression or anxiety. No report of hallucinations, insomnia, paranoia, or agitation ENDOCRINE: Denies polyphagia, polyuria, polydipsia, heat or cold intolerance HEME/LYMPH: Denies excessive bruising, petechia, enlarged lymph nodes, or bleeding problems IMMUNOLOGIC: Denies history of frequent infections, AIDS, or use of immunosuppressive agents    PHYSICAL EXAMINATION  GENERAL APPEARANCE: Well nourished. In no acute distress. Normal body habitus SKIN:  Skin is warm and dry. There are no suspicious lesions or rash HEAD: Normal in size and contour. No evidence of trauma EYES: Lids open and close normally. No blepharitis, entropion or ectropion. PERRL. Conjunctivae are clear and sclerae are white. Lenses are without opacity EARS: Pinnae are normal. Patient hears normal voice tunes of the examiner MOUTH and THROAT: Lips are without lesions. Oral mucosa is moist and without lesions. Tongue is normal in shape, size, and color and without lesions NECK: supple, trachea midline, no neck masses, no thyroid tenderness, no thyromegaly LYMPHATICS: no LAN in the neck, no supraclavicular LAN RESPIRATORY: breathing is even & unlabored, BS CTAB CARDIAC: RRR, no murmur,no extra heart sounds, no edema GI: abdomen soft, normal BS, no masses, no tenderness, no hepatomegaly, no splenomegaly MUSCULOSKELETAL: No deformities. Movement at each extremity is full and painless. Strength is 5/5 at each extremity. Back is without kyphosis or scoliosis CIRCULATION: pedal pulses are 2+. There is no edema of the legs, ankles and  feet NEUROLOGICAL: There is no tremor. Speech is clear PSYCHIATRIC: Alert and oriented X 3. Affect and behavior are appropriate  LABS/RADIOLOGY: Labs reviewed: Basic Metabolic Panel:  Recent Labs  03/11/17 0620 03/12/17 0242 03/13/17 0318 03/20/17  NA 140 137 138 142  K 3.8 3.2* 3.3* 4.3  CL 102 102 104  --   CO2 30 28 28  --   GLUCOSE 94 157* 101*  --   BUN 27* 18 23* 18  CREATININE 0.56 0.54 0.55 0.5  CALCIUM 8.6* 8.4* 8.2*  --    Liver Function Tests:  Recent Labs  01/21/17 2303 03/10/17 1622  AST 24 26  ALT 21 16  ALKPHOS 83 86  BILITOT 0.8 0.8  PROT 7.4 6.7  ALBUMIN 4.3 3.5   CBC:  Recent Labs  01/21/17 2303  03/11/17 0620 03/12/17 0242 03/13/17 0318 03/20/17  WBC 12.5*  < > 7.8 12.2* 9.2 7.9  NEUTROABS 10.7*  --   --   --   --   --     HGB 13.9  < > 11.2* 10.2* 9.4* 9.8*  HCT 40.8  < > 36.4 32.2* 29.6* 30*  MCV 89.7  < > 93.6 92.8 92.5  --   PLT 259  < > 252 263 236 422*  < > = values in this interval not displayed.   Dg Chest 1 View  Result Date: 03/10/2017 CLINICAL DATA:  Pain after fall.  Preop. EXAM: CHEST 1 VIEW COMPARISON:  01/21/2017 FINDINGS: Heart size is within normal limits. There is aortic atherosclerosis. There is atelectasis at the left lung base. No overt pulmonary edema, pneumothorax nor acute osseous abnormality of the bony thorax. Degenerative changes are seen along the thoracic spine and both shoulders. IMPRESSION: Aortic atherosclerosis. Left basilar atelectasis. No acute pneumonic consolidation. Electronically Signed   By: David  Kwon M.D.   On: 03/10/2017 18:56   Dg Tibia/fibula Right  Result Date: 03/10/2017 CLINICAL DATA:  Right leg pain after fall EXAM: RIGHT TIBIA AND FIBULA - 2 VIEW COMPARISON:  None. FINDINGS: Bones appear osteopenic. No acute displaced fracture is noted. There is no joint dislocation. Soft tissue induration is seen along the anterior aspect of the proximal tibial shaft. IMPRESSION: Soft tissue abrasion along the  anterior aspect of the proximal leg. No underlying fracture. Electronically Signed   By: David  Kwon M.D.   On: 03/10/2017 19:02   Pelvis Portable  Result Date: 03/11/2017 CLINICAL DATA:  Status post left hip hemiarthroplasty. EXAM: PORTABLE PELVIS 1-2 VIEWS COMPARISON:  01/22/2017 FINDINGS: A left hip hemiarthroplasty device is identified. The hardware components are in anatomic alignment. No periprosthetic fracture or subluxation. Gas noted within the surrounding soft tissues. IMPRESSION: 1. No complications status post left hip hemiarthroplasty. Electronically Signed   By: Taylor  Stroud M.D.   On: 03/11/2017 10:38   Dg Hip Unilat W Or Wo Pelvis 1 View Left  Result Date: 03/10/2017 CLINICAL DATA:  Left hip pain after fall EXAM: DG HIP (WITH OR WITHOUT PELVIS) 1V*L* COMPARISON:  01/22/2017 FINDINGS: Acute superolateral displacement of the left femoral neck and shaft relative to the femoral head since the intraoperative left femoral neck repair on 01/22/2017. Three cannulated screws are no longer imbedded within the femoral head. A true lateral view was unable to be provided to determine whether the displacement is anterior or posterior relative to the femoral head. The bony pelvis is grossly intact. No pubic rami fractures. IMPRESSION: New superolateral displacement of the left femoral neck and shaft relative to the femoral head with the tips of 3 cannulated screws no longer embedded within the left femoral head. No bony pelvic fracture is seen. Electronically Signed   By: David  Kwon M.D.   On: 03/10/2017 19:00    ASSESSMENT/PLAN:  DME:  None required.      Jamie Carlson - NP    [facility] [phone]   This encounter was created in error - please disregard. 

## 2017-03-31 DIAGNOSIS — Z96642 Presence of left artificial hip joint: Secondary | ICD-10-CM | POA: Diagnosis not present

## 2017-03-31 DIAGNOSIS — R2681 Unsteadiness on feet: Secondary | ICD-10-CM | POA: Diagnosis not present

## 2017-03-31 DIAGNOSIS — R1310 Dysphagia, unspecified: Secondary | ICD-10-CM | POA: Diagnosis not present

## 2017-03-31 DIAGNOSIS — D649 Anemia, unspecified: Secondary | ICD-10-CM | POA: Diagnosis not present

## 2017-03-31 DIAGNOSIS — S72092D Other fracture of head and neck of left femur, subsequent encounter for closed fracture with routine healing: Secondary | ICD-10-CM | POA: Diagnosis not present

## 2017-03-31 DIAGNOSIS — M25552 Pain in left hip: Secondary | ICD-10-CM | POA: Diagnosis not present

## 2017-04-06 DIAGNOSIS — D649 Anemia, unspecified: Secondary | ICD-10-CM | POA: Diagnosis not present

## 2017-04-06 DIAGNOSIS — M25552 Pain in left hip: Secondary | ICD-10-CM | POA: Diagnosis not present

## 2017-04-06 DIAGNOSIS — R2681 Unsteadiness on feet: Secondary | ICD-10-CM | POA: Diagnosis not present

## 2017-04-06 DIAGNOSIS — Z96642 Presence of left artificial hip joint: Secondary | ICD-10-CM | POA: Diagnosis not present

## 2017-04-06 DIAGNOSIS — R1310 Dysphagia, unspecified: Secondary | ICD-10-CM | POA: Diagnosis not present

## 2017-04-07 DIAGNOSIS — Z96642 Presence of left artificial hip joint: Secondary | ICD-10-CM | POA: Diagnosis not present

## 2017-04-10 DIAGNOSIS — M25552 Pain in left hip: Secondary | ICD-10-CM | POA: Diagnosis not present

## 2017-04-10 DIAGNOSIS — R2681 Unsteadiness on feet: Secondary | ICD-10-CM | POA: Diagnosis not present

## 2017-04-10 DIAGNOSIS — Z96642 Presence of left artificial hip joint: Secondary | ICD-10-CM | POA: Diagnosis not present

## 2017-04-10 DIAGNOSIS — D649 Anemia, unspecified: Secondary | ICD-10-CM | POA: Diagnosis not present

## 2017-04-10 DIAGNOSIS — R1310 Dysphagia, unspecified: Secondary | ICD-10-CM | POA: Diagnosis not present

## 2017-04-10 DIAGNOSIS — M25559 Pain in unspecified hip: Secondary | ICD-10-CM | POA: Diagnosis not present

## 2017-04-12 DIAGNOSIS — R1310 Dysphagia, unspecified: Secondary | ICD-10-CM | POA: Diagnosis not present

## 2017-04-12 DIAGNOSIS — Z96642 Presence of left artificial hip joint: Secondary | ICD-10-CM | POA: Diagnosis not present

## 2017-04-12 DIAGNOSIS — D649 Anemia, unspecified: Secondary | ICD-10-CM | POA: Diagnosis not present

## 2017-04-12 DIAGNOSIS — M25552 Pain in left hip: Secondary | ICD-10-CM | POA: Diagnosis not present

## 2017-04-12 DIAGNOSIS — R2681 Unsteadiness on feet: Secondary | ICD-10-CM | POA: Diagnosis not present

## 2017-04-13 DIAGNOSIS — D649 Anemia, unspecified: Secondary | ICD-10-CM | POA: Diagnosis not present

## 2017-04-13 DIAGNOSIS — M25552 Pain in left hip: Secondary | ICD-10-CM | POA: Diagnosis not present

## 2017-04-13 DIAGNOSIS — R1310 Dysphagia, unspecified: Secondary | ICD-10-CM | POA: Diagnosis not present

## 2017-04-13 DIAGNOSIS — R2681 Unsteadiness on feet: Secondary | ICD-10-CM | POA: Diagnosis not present

## 2017-04-13 DIAGNOSIS — Z96642 Presence of left artificial hip joint: Secondary | ICD-10-CM | POA: Diagnosis not present

## 2017-04-14 DIAGNOSIS — R630 Anorexia: Secondary | ICD-10-CM | POA: Diagnosis not present

## 2017-04-14 DIAGNOSIS — R2681 Unsteadiness on feet: Secondary | ICD-10-CM | POA: Diagnosis not present

## 2017-04-14 DIAGNOSIS — M25559 Pain in unspecified hip: Secondary | ICD-10-CM | POA: Diagnosis not present

## 2017-04-14 DIAGNOSIS — D649 Anemia, unspecified: Secondary | ICD-10-CM | POA: Diagnosis not present

## 2017-04-14 DIAGNOSIS — R1310 Dysphagia, unspecified: Secondary | ICD-10-CM | POA: Diagnosis not present

## 2017-04-14 DIAGNOSIS — M25552 Pain in left hip: Secondary | ICD-10-CM | POA: Diagnosis not present

## 2017-04-14 DIAGNOSIS — Z96642 Presence of left artificial hip joint: Secondary | ICD-10-CM | POA: Diagnosis not present

## 2017-04-17 DIAGNOSIS — R1310 Dysphagia, unspecified: Secondary | ICD-10-CM | POA: Diagnosis not present

## 2017-04-17 DIAGNOSIS — Z96642 Presence of left artificial hip joint: Secondary | ICD-10-CM | POA: Diagnosis not present

## 2017-04-17 DIAGNOSIS — Z79899 Other long term (current) drug therapy: Secondary | ICD-10-CM | POA: Diagnosis not present

## 2017-04-17 DIAGNOSIS — R2681 Unsteadiness on feet: Secondary | ICD-10-CM | POA: Diagnosis not present

## 2017-04-17 DIAGNOSIS — R4182 Altered mental status, unspecified: Secondary | ICD-10-CM | POA: Diagnosis not present

## 2017-04-17 DIAGNOSIS — D649 Anemia, unspecified: Secondary | ICD-10-CM | POA: Diagnosis not present

## 2017-04-17 DIAGNOSIS — M25552 Pain in left hip: Secondary | ICD-10-CM | POA: Diagnosis not present

## 2017-04-19 DIAGNOSIS — R4182 Altered mental status, unspecified: Secondary | ICD-10-CM | POA: Diagnosis not present

## 2017-04-19 DIAGNOSIS — R899 Unspecified abnormal finding in specimens from other organs, systems and tissues: Secondary | ICD-10-CM | POA: Diagnosis not present

## 2017-04-21 DIAGNOSIS — D649 Anemia, unspecified: Secondary | ICD-10-CM | POA: Diagnosis not present

## 2017-04-21 DIAGNOSIS — R1310 Dysphagia, unspecified: Secondary | ICD-10-CM | POA: Diagnosis not present

## 2017-04-21 DIAGNOSIS — Z96642 Presence of left artificial hip joint: Secondary | ICD-10-CM | POA: Diagnosis not present

## 2017-04-21 DIAGNOSIS — R2681 Unsteadiness on feet: Secondary | ICD-10-CM | POA: Diagnosis not present

## 2017-04-21 DIAGNOSIS — M25552 Pain in left hip: Secondary | ICD-10-CM | POA: Diagnosis not present

## 2017-04-22 DIAGNOSIS — Z96642 Presence of left artificial hip joint: Secondary | ICD-10-CM | POA: Diagnosis not present

## 2017-04-22 DIAGNOSIS — Z471 Aftercare following joint replacement surgery: Secondary | ICD-10-CM | POA: Diagnosis not present

## 2017-04-22 DIAGNOSIS — M1612 Unilateral primary osteoarthritis, left hip: Secondary | ICD-10-CM | POA: Diagnosis not present

## 2017-04-24 DIAGNOSIS — D649 Anemia, unspecified: Secondary | ICD-10-CM | POA: Diagnosis not present

## 2017-04-24 DIAGNOSIS — M25552 Pain in left hip: Secondary | ICD-10-CM | POA: Diagnosis not present

## 2017-04-24 DIAGNOSIS — Z96642 Presence of left artificial hip joint: Secondary | ICD-10-CM | POA: Diagnosis not present

## 2017-04-24 DIAGNOSIS — Z4789 Encounter for other orthopedic aftercare: Secondary | ICD-10-CM | POA: Diagnosis not present

## 2017-04-24 DIAGNOSIS — M25559 Pain in unspecified hip: Secondary | ICD-10-CM | POA: Diagnosis not present

## 2017-04-24 DIAGNOSIS — R2681 Unsteadiness on feet: Secondary | ICD-10-CM | POA: Diagnosis not present

## 2017-04-24 DIAGNOSIS — Z79899 Other long term (current) drug therapy: Secondary | ICD-10-CM | POA: Diagnosis not present

## 2017-04-24 DIAGNOSIS — R1310 Dysphagia, unspecified: Secondary | ICD-10-CM | POA: Diagnosis not present

## 2017-04-25 ENCOUNTER — Emergency Department (HOSPITAL_COMMUNITY): Payer: Medicare Other

## 2017-04-25 ENCOUNTER — Inpatient Hospital Stay (HOSPITAL_COMMUNITY)
Admission: EM | Admit: 2017-04-25 | Discharge: 2017-05-01 | DRG: 371 | Disposition: A | Discharge status: Skilled Nursing Facility | Payer: Medicare Other | Attending: Internal Medicine | Admitting: Internal Medicine

## 2017-04-25 ENCOUNTER — Encounter (HOSPITAL_COMMUNITY): Payer: Self-pay | Admitting: *Deleted

## 2017-04-25 DIAGNOSIS — E43 Unspecified severe protein-calorie malnutrition: Secondary | ICD-10-CM | POA: Diagnosis not present

## 2017-04-25 DIAGNOSIS — R627 Adult failure to thrive: Secondary | ICD-10-CM | POA: Diagnosis not present

## 2017-04-25 DIAGNOSIS — L89153 Pressure ulcer of sacral region, stage 3: Secondary | ICD-10-CM | POA: Diagnosis present

## 2017-04-25 DIAGNOSIS — Z7189 Other specified counseling: Secondary | ICD-10-CM

## 2017-04-25 DIAGNOSIS — M7062 Trochanteric bursitis, left hip: Secondary | ICD-10-CM | POA: Diagnosis present

## 2017-04-25 DIAGNOSIS — R509 Fever, unspecified: Secondary | ICD-10-CM | POA: Diagnosis not present

## 2017-04-25 DIAGNOSIS — E876 Hypokalemia: Secondary | ICD-10-CM | POA: Diagnosis present

## 2017-04-25 DIAGNOSIS — E440 Moderate protein-calorie malnutrition: Secondary | ICD-10-CM | POA: Diagnosis not present

## 2017-04-25 DIAGNOSIS — Z79899 Other long term (current) drug therapy: Secondary | ICD-10-CM | POA: Diagnosis not present

## 2017-04-25 DIAGNOSIS — R4182 Altered mental status, unspecified: Secondary | ICD-10-CM | POA: Diagnosis not present

## 2017-04-25 DIAGNOSIS — E46 Unspecified protein-calorie malnutrition: Secondary | ICD-10-CM | POA: Diagnosis present

## 2017-04-25 DIAGNOSIS — R6510 Systemic inflammatory response syndrome (SIRS) of non-infectious origin without acute organ dysfunction: Secondary | ICD-10-CM | POA: Diagnosis present

## 2017-04-25 DIAGNOSIS — Z66 Do not resuscitate: Secondary | ICD-10-CM | POA: Diagnosis present

## 2017-04-25 DIAGNOSIS — Z681 Body mass index (BMI) 19 or less, adult: Secondary | ICD-10-CM | POA: Diagnosis not present

## 2017-04-25 DIAGNOSIS — A419 Sepsis, unspecified organism: Secondary | ICD-10-CM | POA: Diagnosis not present

## 2017-04-25 DIAGNOSIS — I10 Essential (primary) hypertension: Secondary | ICD-10-CM | POA: Diagnosis present

## 2017-04-25 DIAGNOSIS — I350 Nonrheumatic aortic (valve) stenosis: Secondary | ICD-10-CM | POA: Diagnosis present

## 2017-04-25 DIAGNOSIS — Z96642 Presence of left artificial hip joint: Secondary | ICD-10-CM | POA: Diagnosis present

## 2017-04-25 DIAGNOSIS — Z515 Encounter for palliative care: Secondary | ICD-10-CM | POA: Diagnosis present

## 2017-04-25 DIAGNOSIS — E860 Dehydration: Secondary | ICD-10-CM | POA: Diagnosis present

## 2017-04-25 DIAGNOSIS — M8580 Other specified disorders of bone density and structure, unspecified site: Secondary | ICD-10-CM | POA: Diagnosis present

## 2017-04-25 DIAGNOSIS — I272 Pulmonary hypertension, unspecified: Secondary | ICD-10-CM | POA: Diagnosis present

## 2017-04-25 DIAGNOSIS — A0472 Enterocolitis due to Clostridium difficile, not specified as recurrent: Principal | ICD-10-CM

## 2017-04-25 DIAGNOSIS — Z7982 Long term (current) use of aspirin: Secondary | ICD-10-CM

## 2017-04-25 DIAGNOSIS — R197 Diarrhea, unspecified: Secondary | ICD-10-CM | POA: Diagnosis not present

## 2017-04-25 DIAGNOSIS — E871 Hypo-osmolality and hyponatremia: Secondary | ICD-10-CM | POA: Diagnosis not present

## 2017-04-25 DIAGNOSIS — M7072 Other bursitis of hip, left hip: Secondary | ICD-10-CM | POA: Diagnosis present

## 2017-04-25 DIAGNOSIS — L8990 Pressure ulcer of unspecified site, unspecified stage: Secondary | ICD-10-CM | POA: Insufficient documentation

## 2017-04-25 DIAGNOSIS — D649 Anemia, unspecified: Secondary | ICD-10-CM | POA: Diagnosis present

## 2017-04-25 DIAGNOSIS — Z471 Aftercare following joint replacement surgery: Secondary | ICD-10-CM | POA: Diagnosis not present

## 2017-04-25 HISTORY — DX: Nonrheumatic aortic (valve) stenosis: I35.0

## 2017-04-25 LAB — URINALYSIS, ROUTINE W REFLEX MICROSCOPIC
BILIRUBIN URINE: NEGATIVE
Bacteria, UA: NONE SEEN
Glucose, UA: NEGATIVE mg/dL
Hgb urine dipstick: NEGATIVE
Ketones, ur: NEGATIVE mg/dL
LEUKOCYTES UA: NEGATIVE
Nitrite: NEGATIVE
PH: 5 (ref 5.0–8.0)
Protein, ur: 30 mg/dL — AB
SPECIFIC GRAVITY, URINE: 1.02 (ref 1.005–1.030)

## 2017-04-25 LAB — CBC WITH DIFFERENTIAL/PLATELET
Basophils Absolute: 0 10*3/uL (ref 0.0–0.1)
Basophils Relative: 0 %
EOS PCT: 2 %
Eosinophils Absolute: 0.5 10*3/uL (ref 0.0–0.7)
HCT: 32.1 % — ABNORMAL LOW (ref 36.0–46.0)
Hemoglobin: 10.5 g/dL — ABNORMAL LOW (ref 12.0–15.0)
LYMPHS ABS: 1.4 10*3/uL (ref 0.7–4.0)
LYMPHS PCT: 6 %
MCH: 28.8 pg (ref 26.0–34.0)
MCHC: 32.7 g/dL (ref 30.0–36.0)
MCV: 88.2 fL (ref 78.0–100.0)
MONOS PCT: 8 %
Monocytes Absolute: 1.7 10*3/uL — ABNORMAL HIGH (ref 0.1–1.0)
Neutro Abs: 18.8 10*3/uL — ABNORMAL HIGH (ref 1.7–7.7)
Neutrophils Relative %: 84 %
PLATELETS: 543 10*3/uL — AB (ref 150–400)
RBC: 3.64 MIL/uL — AB (ref 3.87–5.11)
RDW: 13.6 % (ref 11.5–15.5)
WBC: 22.4 10*3/uL — AB (ref 4.0–10.5)

## 2017-04-25 LAB — COMPREHENSIVE METABOLIC PANEL
ALT: 17 U/L (ref 14–54)
AST: 21 U/L (ref 15–41)
Albumin: 2.2 g/dL — ABNORMAL LOW (ref 3.5–5.0)
Alkaline Phosphatase: 71 U/L (ref 38–126)
Anion gap: 8 (ref 5–15)
BUN: 30 mg/dL — AB (ref 6–20)
CO2: 26 mmol/L (ref 22–32)
Calcium: 8.7 mg/dL — ABNORMAL LOW (ref 8.9–10.3)
Chloride: 98 mmol/L — ABNORMAL LOW (ref 101–111)
Creatinine, Ser: 0.82 mg/dL (ref 0.44–1.00)
GFR calc Af Amer: >60 mL/min (ref >60)
GFR calc non Af Amer: 59 mL/min — ABNORMAL LOW (ref >60)
Glucose, Bld: 114 mg/dL — ABNORMAL HIGH (ref 65–99)
POTASSIUM: 3.2 mmol/L — AB (ref 3.5–5.1)
SODIUM: 132 mmol/L — AB (ref 135–145)
Total Bilirubin: 0.5 mg/dL (ref 0.3–1.2)
Total Protein: 5.4 g/dL — ABNORMAL LOW (ref 6.5–8.1)

## 2017-04-25 LAB — I-STAT CG4 LACTIC ACID, ED
LACTIC ACID, VENOUS: 1.33 mmol/L (ref 0.5–1.9)
LACTIC ACID, VENOUS: 1.96 mmol/L — AB (ref 0.5–1.9)

## 2017-04-25 LAB — PROTIME-INR
INR: 1.22
Prothrombin Time: 15.4 seconds — ABNORMAL HIGH (ref 11.4–15.2)

## 2017-04-25 LAB — C DIFFICILE QUICK SCREEN W PCR REFLEX
C DIFFICLE (CDIFF) ANTIGEN: POSITIVE — AB
C Diff toxin: NEGATIVE

## 2017-04-25 LAB — CLOSTRIDIUM DIFFICILE BY PCR: CDIFFPCR: POSITIVE — AB

## 2017-04-25 LAB — C-REACTIVE PROTEIN: CRP: 12.5 mg/dL — ABNORMAL HIGH (ref <1.0)

## 2017-04-25 LAB — SEDIMENTATION RATE: Sed Rate: 27 mm/hr — ABNORMAL HIGH (ref 0–22)

## 2017-04-25 MED ORDER — MORPHINE SULFATE (PF) 4 MG/ML IV SOLN
1.0000 mg | INTRAVENOUS | Status: DC | PRN
  Administered 2017-04-26: 1 mg via INTRAVENOUS
  Administered 2017-04-27: 4 mg via INTRAVENOUS
  Administered 2017-04-28: 2 mg via INTRAVENOUS
  Filled 2017-04-25 (×4): qty 1

## 2017-04-25 MED ORDER — MIRTAZAPINE 7.5 MG PO TABS
7.5000 mg | ORAL_TABLET | Freq: Every day | ORAL | Status: DC
  Administered 2017-04-25 – 2017-04-28 (×4): 7.5 mg via ORAL
  Filled 2017-04-25 (×6): qty 1

## 2017-04-25 MED ORDER — ACETAMINOPHEN 325 MG PO TABS
650.0000 mg | ORAL_TABLET | Freq: Four times a day (QID) | ORAL | Status: DC
  Administered 2017-04-25 – 2017-04-30 (×12): 650 mg via ORAL
  Filled 2017-04-25 (×18): qty 2

## 2017-04-25 MED ORDER — ONDANSETRON HCL 4 MG PO TABS
4.0000 mg | ORAL_TABLET | Freq: Four times a day (QID) | ORAL | Status: DC | PRN
Start: 2017-04-25 — End: 2017-05-01

## 2017-04-25 MED ORDER — ASPIRIN 325 MG PO TABS
325.0000 mg | ORAL_TABLET | Freq: Every day | ORAL | Status: DC
  Administered 2017-04-26 – 2017-04-29 (×4): 325 mg via ORAL
  Filled 2017-04-25 (×6): qty 1

## 2017-04-25 MED ORDER — BOOST / RESOURCE BREEZE PO LIQD
1.0000 | Freq: Three times a day (TID) | ORAL | Status: DC
  Administered 2017-04-25 – 2017-04-26 (×2): 1 via ORAL

## 2017-04-25 MED ORDER — LORAZEPAM 0.5 MG PO TABS
0.5000 mg | ORAL_TABLET | Freq: Two times a day (BID) | ORAL | Status: DC | PRN
  Administered 2017-04-27 – 2017-04-28 (×2): 0.5 mg via ORAL
  Filled 2017-04-25 (×2): qty 1

## 2017-04-25 MED ORDER — ENOXAPARIN SODIUM 30 MG/0.3ML ~~LOC~~ SOLN
20.0000 mg | SUBCUTANEOUS | Status: DC
  Administered 2017-04-25 – 2017-04-30 (×4): 20 mg via SUBCUTANEOUS
  Filled 2017-04-25 (×6): qty 0.3

## 2017-04-25 MED ORDER — SODIUM CHLORIDE 0.9 % IV SOLN
INTRAVENOUS | Status: DC
  Administered 2017-04-25: 09:00:00 via INTRAVENOUS

## 2017-04-25 MED ORDER — VANCOMYCIN 50 MG/ML ORAL SOLUTION
125.0000 mg | Freq: Four times a day (QID) | ORAL | Status: DC
  Administered 2017-04-25 – 2017-05-01 (×22): 125 mg via ORAL
  Filled 2017-04-25 (×27): qty 2.5

## 2017-04-25 MED ORDER — METRONIDAZOLE IN NACL 5-0.79 MG/ML-% IV SOLN
500.0000 mg | Freq: Three times a day (TID) | INTRAVENOUS | Status: DC
Start: 2017-04-25 — End: 2017-04-29
  Administered 2017-04-25 – 2017-04-29 (×13): 500 mg via INTRAVENOUS
  Filled 2017-04-25 (×15): qty 100

## 2017-04-25 MED ORDER — DEXTROSE 5 % IV SOLN: 1.0000 g | Freq: Once | INTRAVENOUS | Status: DC

## 2017-04-25 MED ORDER — ONDANSETRON HCL 4 MG/2ML IJ SOLN: 4.0000 mg | Freq: Four times a day (QID) | INTRAMUSCULAR | Status: DC | PRN

## 2017-04-25 MED ORDER — KCL IN DEXTROSE-NACL 20-5-0.9 MEQ/L-%-% IV SOLN
INTRAVENOUS | Status: DC
  Administered 2017-04-25 – 2017-04-28 (×7): via INTRAVENOUS
  Administered 2017-04-29: 1 mL via INTRAVENOUS
  Administered 2017-04-30 – 2017-05-01 (×3): via INTRAVENOUS
  Filled 2017-04-25 (×15): qty 1000

## 2017-04-25 NOTE — ED Notes (Signed)
Attempted report x1. 

## 2017-04-25 NOTE — ED Notes (Signed)
Pt cleaned after bowel movement. 

## 2017-04-25 NOTE — ED Notes (Signed)
Pt found attempting to get out of bed by EMT. Pt put back in bed and explained why it is important to stay in bed

## 2017-04-25 NOTE — ED Notes (Signed)
Patient transported to MRI 

## 2017-04-25 NOTE — ED Notes (Signed)
Daughter at bedside. Educated on C. Diff precautions and importance of washing hands with soap and water.

## 2017-04-25 NOTE — ED Notes (Addendum)
Pt. In MRI. Will call portable xray when they return.

## 2017-04-25 NOTE — Care Management Note (Signed)
Case Management Note  Patient Details  Name: Jamie Carlson MRN: [id number] Date of Birth: 12-31-1917  Subjective/Objective:                    Action/Plan:   Expected Discharge Date:                  Expected Discharge Plan:  Skilled Nursing Facility  In-House Referral:  Clinical Social Work  Discharge planning Services     Post Acute Care Choice:    Choice offered to:     DME Arranged:    DME Agency:     HH Arranged:    HH Agency:     Status of Service:  In process, will continue to follow  If discussed at Long Length of Stay Meetings, dates discussed:    Additional Comments:  [name], [name], RN 04/25/2017, 2:01 PM

## 2017-04-25 NOTE — H&P (Signed)
History and Physical    Jamie Carlson ZOX:[id number] DOB: Dec 15, 1917 DOA: 04/25/2017   PCP: Oneal Grout, MD   Patient coming from/Resides with: [facility]  Chief Complaint: Fever and leukocytosis  HPI: Jamie Carlson is a 83 y.o. female with medical history significant for hypertension, mild aortic stenosis with associated moderate to severe pulmonary hypertension, protein calorie malnutrition (severe) and overall functional decline. He shouldn't had been living independently at home until March of this year when she tripped and fell and sustained a left femoral neck fracture. She underwent operative placement of screws and was discharged to a skilled nursing facility for rehabilitative therapies. She was discharged from the skilled nursing facility on April 2. She was readmitted to the hospital on May 4 after experiencing 2 falls at home. She was found to have a displaced femoral neck fracture upon evaluation in the ER and underwent a left hip hemiarthroplasty and was subsequently discharged back to the skilled nursing facility for additional short-term rehabilitation. On May 11 the nursing home physician evaluated her for possible oral thrush noting she has a history of mild dysphagia and is on a mechanical soft diet at the facility. It was felt that she had "coated tongue" and aggressive oral hygiene was instituted. Today she presents to the ER reports of fever near 102F and a leukocytosis in the 20,000 range. In the ER she was afebrile and normotensive and non-tachycardic. She appeared to be dehydrated with a sodium of 132 a potassium of 3.2 and a BUN of 30. Her white count was 22,400 with neutrophils 84% and absolute neutrophils 18.8%. Her platelets were elevated at 543,000. CRP was elevated at 12.5. Initial lactic acid was 1.33 with repeat slightly elevated at 1.96. Since arrival to the ER the patient has had several loose/diarrheal stools.  In review of documentation sent from the  nursing facility she was sent to the orthopedic physician's office (Dr. Jerl Santos) to be evaluated for possible left hip incision infection on 6/16. The previous week she had a stitch abscess and was given a course of Keflex by the SNF. Dr. Jerl Santos did not believe the patient had any signs/sx's of hip infection during his evaluation on 6/16. Patient reports multiple diarrheal and watery stools for at least 2-3 days with associated generalized abdominal soreness. She also reports burning with urination.  ED Course:  Vital Signs: BP 110/70   Pulse 72   Temp 97.4 F (36.3 C) (Oral)   Resp 18   Ht 4\' 11"  (1.499 m)   Wt 34 kg (75 lb)   SpO2 95%   BMI 15.15 kg/m  2 view CXR: Neg MR left hip: Pending at time of sign out to accepting hospitalist Lab data: Sodium 132, potassium 3.2, chloride 98, CO2 26, glucose 110, BUN 30, creatinine 0.82, calcium 8.7, albumin 2.2, CRP 12.5, lactic acid 1.33 >> 1.96, white count 22,400 with neutrophils 44% and absolute neutral is 18.8%, hemoglobin 10.5, platelets 543,000, PT 15.4 INR 1.22, urinalysis unremarkable except for hazy appearance, 30 protein, specific gravity 1.020; blood cultures and urine culture obtained in the ER Medications and treatments: None  Review of Systems:  In addition to the HPI above,  No Headache, changes with Vision or hearing, new weakness, tingling, numbness in any extremity, dizziness, dysarthria or word finding difficulty, gait disturbance or imbalance, tremors or seizure activity No problems swallowing food or Liquids, indigestion/reflux, choking or coughing while eating, abdominal pain with or after eating No Chest pain, Cough or Shortness of  Breath, palpitations, orthopnea or DOE No emesis, melena,hematochezia, dark tarry stools No hematuria or flank pain No new skin rashes, lesions, masses or bruises, No new joint pains, aches, swelling or redness No recent unintentional weight gain or loss No polyuria, polydypsia or  polyphagia   Past Medical History:  Diagnosis Date  . Cardiac murmur   . Closed subcapital fracture of femur, left, initial encounter (HCC)   . Hypertension   . Hypertensive urgency   . Hypokalemia   . Protein-calorie malnutrition, severe (HCC)   . Urinary tract infection     Past Surgical History:  Procedure Laterality Date  . CESAREAN SECTION    . HARDWARE REMOVAL  03/11/2017   Procedure: HARDWARE REMOVAL;  Surgeon: Marcene Corning, MD;  Location: [facility] OR;  Service: Orthopedics;;  . HIP ARTHROPLASTY Left 03/11/2017   Procedure: ARTHROPLASTY BIPOLAR HIP (HEMIARTHROPLASTY) AND HARDWARE REMOVAL POSTERIOR APPROACH;  Surgeon: Marcene Corning, MD;  Location: [facility] OR;  Service: Orthopedics;  Laterality: Left;  . HIP PINNING,CANNULATED Left 01/22/2017   Procedure: CANNULATED HIP PINNING;  Surgeon: Jones Broom, MD;  Location: [facility] OR;  Service: Orthopedics;  Laterality: Left;    Social History   Social History  . Marital status: Widowed    Spouse name: N/A  . Number of children: N/A  . Years of education: N/A   Occupational History  . Not on file.   Social History Main Topics  . Smoking status: Never Smoker  . Smokeless tobacco: Never Used  . Alcohol use No  . Drug use: No  . Sexual activity: Not on file   Other Topics Concern  . Not on file   Social History Narrative  . No narrative on file    Mobility: Wheelchair mobility-?? Is still requiring left knee immobilizer-rolling walker Work history: Not obtained   Allergies  Allergen Reactions  . Sulfa Antibiotics Other (See Comments)    Unknown allergic reaction  . Bee Venom Rash    Family History  Problem Relation Age of Onset  . Family history unknown: Yes     Prior to Admission medications   Medication Sig Start Date End Date Taking? Authorizing Provider  acetaminophen (TYLENOL) 325 MG tablet Take 650 mg by mouth every 6 (six) hours. For 14 days   Yes [provider]  acetaminophen (TYLENOL) 325 MG  tablet Take 650 mg by mouth every 4 (four) hours as needed for mild pain.   Yes [provider]  amLODipine (NORVASC) 5 MG tablet Take 1 tablet (5 mg total) by mouth daily. 03/13/17  Yes Ghimire, Werner Lean, MD  aspirin 325 MG tablet Take 325 mg by mouth daily.   Yes [provider]  cephALEXin (KEFLEX) 500 MG capsule Take 500 mg by mouth 3 (three) times daily. For 10 days   Yes [provider]  LORazepam (ATIVAN) 0.5 MG tablet Take 0.5 mg by mouth 2 (two) times daily as needed for anxiety.   Yes [provider]  NUTRITIONAL SUPPLEMENT LIQD Take 240 mLs by mouth 2 (two) times daily. MedPass   Yes [provider]  oxyCODONE (OXY IR/ROXICODONE) 5 MG immediate release tablet Take 2.5 mg by mouth every 6 (six) hours as needed for severe pain.   Yes [provider]  polyethylene glycol (MIRALAX / GLYCOLAX) packet Take 17 g by mouth daily as needed for mild constipation. 01/25/17  Yes Osvaldo Shipper, MD  Probiotic Product (PROBIOTIC PO) Take 1 capsule by mouth 2 (two) times daily. For 10 days  Yes [provider]  Psyllium (METAMUCIL) 28.3 % POWD Take 1 scoop by mouth daily. Mix in 8 oz liquid and drink   Yes [provider]  sennosides-docusate sodium (SENOKOT-S) 8.6-50 MG tablet Take 1 tablet by mouth every other day.   Yes [provider]  acetaminophen (TYLENOL) 500 MG tablet Take 2 tablets (1,000 mg total) by mouth every 8 (eight) hours. Patient not taking: Reported on 04/25/2017 03/13/17   Maretta Bees, MD  aspirin EC 81 MG EC tablet Take 1 tablet (81 mg total) by mouth 2 (two) times daily. For 4 weeks from 03/11/17 Patient not taking: Reported on 04/25/2017 03/13/17   Maretta Bees, MD  docusate sodium (COLACE) 100 MG capsule Take 100 mg by mouth every other day.     [provider]  mirtazapine (REMERON) 7.5 MG tablet Take 7.5 mg by mouth at bedtime.    [provider]  senna (SENOKOT) 8.6 MG TABS  tablet Take 2 tablets by mouth daily.    [provider]    Physical Exam: Vitals:   04/25/17 0600 04/25/17 0648 04/25/17 0654 04/25/17 0658  BP: 100/70 110/70    Pulse: 82  72   Resp: 18   18  Temp:      TempSrc:      SpO2: 96%  95%   Weight:      Height:          Constitutional: NAD, Anxious, uncomfortable 2/2 reports of sacral pain after MRI-appears very frail and cachectic-HOH Eyes: PERRL, lids and conjunctivae normal ENMT: Mucous membranes are dry. Posterior pharynx clear of any exudate or lesions..  Neck: normal, supple, no masses, no thyromegaly Respiratory: clear to auscultation bilaterally, no wheezing, no crackles. Normal respiratory effort. No accessory muscle use.  Cardiovascular: Regular rate and rhythm, no murmurs / rubs / gallops. No extremity edema. 2+ pedal pulses. No carotid bruits.  Abdomen: Diffuse tenderness without guarding or rebounding, no masses palpated. No hepatosplenomegaly. Bowel sounds positive. Scaphoid appearance to abdominal wall Musculoskeletal: no clubbing / cyanosis. No joint deformity upper and lower extremities. Good ROM, no contractures. Normal muscle tone.  Skin: no rashes, lesions, ulcers. No induration-poor skin turgor Neurologic: CN 2-12 grossly intact. Sensation intact, DTR normal. Strength 5/5 x all 4 extremities.  Psychiatric: Normal judgment and insight. Alert and oriented x 3. Anxious mood.    Labs on Admission: I have personally reviewed following labs and imaging studies  CBC:  Recent Labs Lab 04/25/17 0206  WBC 22.4*  NEUTROABS 18.8*  HGB 10.5*  HCT 32.1*  MCV 88.2  PLT 543*   Basic Metabolic Panel:  Recent Labs Lab 04/25/17 0206  NA 132*  K 3.2*  CL 98*  CO2 26  GLUCOSE 114*  BUN 30*  CREATININE 0.82  CALCIUM 8.7*   GFR: Estimated Creatinine Clearance: 21.5 mL/min (by C-G formula based on SCr of 0.82 mg/dL). Liver Function Tests:  Recent Labs Lab 04/25/17 0206  AST 21  ALT 17  ALKPHOS 71   BILITOT 0.5  PROT 5.4*  ALBUMIN 2.2*   No results for input(s): LIPASE, AMYLASE in the last 168 hours. No results for input(s): AMMONIA in the last 168 hours. Coagulation Profile:  Recent Labs Lab 04/25/17 0206  INR 1.22   Cardiac Enzymes: No results for input(s): CKTOTAL, CKMB, CKMBINDEX, TROPONINI in the last 168 hours. BNP (last 3 results) No results for input(s): PROBNP in the last 8760 hours. HbA1C: No results for input(s): HGBA1C in the last  72 hours. CBG: No results for input(s): GLUCAP in the last 168 hours. Lipid Profile: No results for input(s): CHOL, HDL, LDLCALC, TRIG, CHOLHDL, LDLDIRECT in the last 72 hours. Thyroid Function Tests: No results for input(s): TSH, T4TOTAL, FREET4, T3FREE, THYROIDAB in the last 72 hours. Anemia Panel: No results for input(s): VITAMINB12, FOLATE, FERRITIN, TIBC, IRON, RETICCTPCT in the last 72 hours. Urine analysis:    Component Value Date/Time   COLORURINE YELLOW 04/25/2017 0304   APPEARANCEUR HAZY (A) 04/25/2017 0304   LABSPEC 1.020 04/25/2017 0304   PHURINE 5.0 04/25/2017 0304   GLUCOSEU NEGATIVE 04/25/2017 0304   HGBUR NEGATIVE 04/25/2017 0304   BILIRUBINUR NEGATIVE 04/25/2017 0304   KETONESUR NEGATIVE 04/25/2017 0304   PROTEINUR 30 (A) 04/25/2017 0304   UROBILINOGEN 1.0 06/22/2013 1458   NITRITE NEGATIVE 04/25/2017 0304   LEUKOCYTESUR NEGATIVE 04/25/2017 0304   Sepsis Labs: @LABRCNTIP (procalcitonin:4,lacticidven:4) ) Recent Results (from the past 240 hour(s))  Blood Culture (routine x 2)     Status: None (Preliminary result)   Collection Time: 04/25/17  4:00 AM  Result Value Ref Range Status   Specimen Description BLOOD RIGHT ARM  Final   Special Requests IN PEDIATRIC BOTTLE Blood Culture adequate volume  Final   Culture PENDING  Incomplete   Report Status PENDING  Incomplete     Radiological Exams on Admission: Dg Chest 2 View  Result Date: 04/25/2017 CLINICAL DATA:  Initial evaluation for acute fever, hip  pain, recent surgery. EXAM: CHEST  2 VIEW COMPARISON:  The prior radiograph from 03/10/2017. FINDINGS: Mild cardiomegaly, stable. Mediastinal silhouette within normal limits. Aortic atherosclerosis noted. Lungs mildly hypoinflated. No focal infiltrates. No pulmonary edema or pleural effusion. No pneumothorax. No acute osseus abnormality. Diffuse osteopenia with exaggeration the normal thoracic kyphosis. IMPRESSION: 1. Shallow lung inflation with with no active cardiopulmonary disease identified. 2. Aortic atherosclerosis. Electronically Signed   By: Rise MuBenjamin  McClintock M.D.   On: 04/25/2017 02:56   Dg Hip Unilat W Or Wo Pelvis 2-3 Views Left  Result Date: 04/25/2017 CLINICAL DATA:  Initial evaluation for acute fever, hip pain, recent surgery. EXAM: DG HIP (WITH OR WITHOUT PELVIS) 2-3V LEFT COMPARISON:  Prior radiograph from 03/10/2017. FINDINGS: There has been interval placement of a left total hip arthroplasty. Previously seen left-sided cannulated lack fixation screws have been removed. Arthroplasty appears well seated without complication. No acute fracture or dislocation. Visualized bony pelvis intact. Osteopenia. Degenerative changes noted within the lower lumbar spine. No acute soft tissue abnormality. IMPRESSION: 1. Left total hip arthroplasty in place without complication. 2. No other acute osseous abnormality about the left hip. Electronically Signed   By: Rise MuBenjamin  McClintock M.D.   On: 04/25/2017 02:58     Assessment/Plan Principal Problem:   SIRS (systemic inflammatory response syndrome)  -Patient presents with fever and significant leukocytosis with stable hemodynamic status -source of infection yet to be identified -Pain with movement of left hip -EDP concerned re: possible infected prosthesis-MRI pending -Also reporting diarrhea--need to exclude C. difficile colitis-given Keflex at nursing facility last week -Appears quite dehydrated and has underlying protein calorie malnutrition and  reported dysphagia on a mechanical soft diet---volume resuscitate -Follow up on blood culture and urine culture -Repeat CBC in a.m.  Active Problems:   Diarrhea -Uncertain if related to infectious etiology such as viral gastroenteritis or C. difficile colitis versus marker of severe constipation (patient with history of) -Flagyl 500 mg IV every 8 hours -Follow up C. difficile PCR; obtain GI pathogen PCR panel -Obtain plain abdominal films to  rule out severe constipation -Continue supportive care -Hold preadmission Senokot, MiraLAX, Metamucil and Colace if proven to not have underlying constipation    Dehydration with hyponatremia -Normal saline IV fluids -Follow labs    Hypertension -Current blood pressure readings somewhat suboptimal in setting of dehydration and diarrhea -Hold preadmission Norvasc    Aortic stenosis/ Pulmonary HTN  -At risk for syncopal episodes and falls when volume depleted -Hydration as above    History of total left hip arthroplasty -Currently at a skilled nursing facility for the second time in 3 months after a fall requiring surgical intervention -Continue rehabilitative therapies -Inpatient PT/OT -IV morphine for pain until diarrhea and GI symptoms improved    Protein calorie malnutrition/mild dysphagia -On mechanical soft diet at nursing facility -Current BMI 15.2 -Nutrition consultation -Protein supplementation -Air mattress      DVT prophylaxis: Lovenox Code Status: DO NOT RESUSCITATE Family Communication: No family at bedside Disposition Plan: SNF Consults called: None     Va Broadwell L. ANP-BC [facility] Pager [phone]   If 7PM-7AM, please contact night-coverage [email or web address] Password [email or web address]  04/25/2017, 7:23 AM

## 2017-04-25 NOTE — ED Notes (Signed)
Pt. Returned from MRI at this time.  

## 2017-04-25 NOTE — Progress Notes (Addendum)
Spoke at length with daughter-patient has been having loose stools and crampy abdominal pain for 3 months i.e. since initial hospitalization in March. Over the past 3 days because of postprandial abdominal discomfort and worsening of diarrhea patient has refused to eat.  C. difficile antigen positive but toxin negative on quick scan PCR. Complete C. difficile panel as well as gastrointestinal panel pending. Due to duration and severity of diarrhea, associated significant leukocytosis and dehydration we will continue to treat empirically with oral vancomycin and IV Flagyl.  Subsequent toxigenic C. difficile PCR positive  MR hip reveals left hip bursitis- consider steroid injection to bursa on 6/20  Junious SilkAllison Ellis, ANP

## 2017-04-25 NOTE — ED Notes (Addendum)
Pt changed and cleaned after BM

## 2017-04-25 NOTE — ED Triage Notes (Signed)
[facility] fever of 100.0 and elevated WBCs. Per paperwork, WBC 20.4. Pt denies complaints at present

## 2017-04-26 DIAGNOSIS — R6510 Systemic inflammatory response syndrome (SIRS) of non-infectious origin without acute organ dysfunction: Secondary | ICD-10-CM

## 2017-04-26 DIAGNOSIS — I350 Nonrheumatic aortic (valve) stenosis: Secondary | ICD-10-CM

## 2017-04-26 DIAGNOSIS — A0472 Enterocolitis due to Clostridium difficile, not specified as recurrent: Principal | ICD-10-CM

## 2017-04-26 DIAGNOSIS — E871 Hypo-osmolality and hyponatremia: Secondary | ICD-10-CM

## 2017-04-26 DIAGNOSIS — R197 Diarrhea, unspecified: Secondary | ICD-10-CM

## 2017-04-26 LAB — GASTROINTESTINAL PANEL BY PCR, STOOL (REPLACES STOOL CULTURE)

## 2017-04-26 LAB — URINE CULTURE: CULTURE: NO GROWTH

## 2017-04-26 LAB — COMPREHENSIVE METABOLIC PANEL
ALBUMIN: 2 g/dL — AB (ref 3.5–5.0)
ALK PHOS: 72 U/L (ref 38–126)
ALT: 13 U/L — AB (ref 14–54)
ANION GAP: 7 (ref 5–15)
AST: 14 U/L — AB (ref 15–41)
BUN: 18 mg/dL (ref 6–20)
CALCIUM: 8.3 mg/dL — AB (ref 8.9–10.3)
CO2: 23 mmol/L (ref 22–32)
Chloride: 108 mmol/L (ref 101–111)
Creatinine, Ser: 0.57 mg/dL (ref 0.44–1.00)
GFR calc Af Amer: >60 mL/min (ref >60)
GFR calc non Af Amer: >60 mL/min (ref >60)
GLUCOSE: 158 mg/dL — AB (ref 65–99)
Potassium: 3.7 mmol/L (ref 3.5–5.1)
SODIUM: 138 mmol/L (ref 135–145)
Total Bilirubin: 0.3 mg/dL (ref 0.3–1.2)
Total Protein: 5.1 g/dL — ABNORMAL LOW (ref 6.5–8.1)

## 2017-04-26 LAB — CBC
HCT: 31.9 % — ABNORMAL LOW (ref 36.0–46.0)
HEMOGLOBIN: 10.1 g/dL — AB (ref 12.0–15.0)
MCH: 28.3 pg (ref 26.0–34.0)
MCHC: 31.7 g/dL (ref 30.0–36.0)
MCV: 89.4 fL (ref 78.0–100.0)
Platelets: 506 10*3/uL — ABNORMAL HIGH (ref 150–400)
RBC: 3.57 MIL/uL — ABNORMAL LOW (ref 3.87–5.11)
RDW: 13.3 % (ref 11.5–15.5)
WBC: 21.7 10*3/uL — ABNORMAL HIGH (ref 4.0–10.5)

## 2017-04-26 LAB — MRSA PCR SCREENING: MRSA BY PCR: POSITIVE — AB

## 2017-04-26 MED ORDER — CHLORHEXIDINE GLUCONATE CLOTH 2 % EX PADS
6.0000 | MEDICATED_PAD | Freq: Every day | CUTANEOUS | Status: DC
  Administered 2017-04-26 – 2017-05-01 (×5): 6 via TOPICAL

## 2017-04-26 MED ORDER — ADULT MULTIVITAMIN W/MINERALS CH
1.0000 | ORAL_TABLET | Freq: Every day | ORAL | Status: DC
  Administered 2017-04-27 – 2017-04-29 (×3): 1 via ORAL
  Filled 2017-04-26 (×4): qty 1

## 2017-04-26 MED ORDER — MUPIROCIN 2 % EX OINT
TOPICAL_OINTMENT | Freq: Two times a day (BID) | CUTANEOUS | Status: DC
  Administered 2017-04-26 – 2017-04-28 (×6): via NASAL
  Administered 2017-04-29: 1 via NASAL
  Administered 2017-04-30 – 2017-05-01 (×3): via NASAL
  Filled 2017-04-26: qty 22

## 2017-04-26 MED ORDER — ENSURE ENLIVE PO LIQD
237.0000 mL | Freq: Two times a day (BID) | ORAL | Status: DC
  Administered 2017-04-28 – 2017-05-01 (×7): 237 mL via ORAL

## 2017-04-26 MED ORDER — PRO-STAT SUGAR FREE PO LIQD
30.0000 mL | Freq: Two times a day (BID) | ORAL | Status: DC
  Administered 2017-04-26 – 2017-04-29 (×6): 30 mL via ORAL
  Filled 2017-04-26 (×10): qty 30

## 2017-04-26 MED ORDER — DRONABINOL 2.5 MG PO CAPS
2.5000 mg | ORAL_CAPSULE | Freq: Two times a day (BID) | ORAL | Status: DC
  Administered 2017-04-26 – 2017-05-01 (×9): 2.5 mg via ORAL
  Filled 2017-04-26 (×10): qty 1

## 2017-04-26 NOTE — Consult Note (Signed)
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  Wound type:pressure Pressure Injury POA: Yes Measurement: 1 cm x 1 cm erythema, with 0.5 cm x 0.3 cm nonintact opening in the center.   Wound JXB:JYNWbed:pale pink Drainage (amount, consistency, odor) minimal serosanguinous.  Tender to touch Periwound:Erytehma, nonblanchable Dressing procedure/placement/frequency:Cleanse coccyx wound with NS and pat gently dry.  Fill wound depth with Iodoform packing strip (Lwson # [id number]).  Cover with silicone border sacral foam.  Change packing strip daily and foam every 3 days and PRN soilage.  Encourage to turn and reposition.  Will not follow at this time.  Please re-consult if needed.  Maple HudsonKaren Rayme Bui RN BSN CWON Pager [phone]

## 2017-04-26 NOTE — Progress Notes (Signed)
OT Cancellation    04/26/17 1500  OT Visit Information  Last OT Received On 04/26/17  Reason Eval/Treat Not Completed Fatigue/lethargy limiting ability to participate (Upon arrival, pt getting back to bed after toileting. Pt requesting to stay in bed. Will return for evaluation as schedule allows.)   Tenishia Ekman MSOT, OTR/L Acute Rehab Pager: [phone] Office: [phone]

## 2017-04-26 NOTE — Progress Notes (Signed)
Stopped by to try to see daughter w/ pt, but she'd not yet returned. Nurse said [facility] not [facility], and I explained the process for calling a Catholic priest to administer the Sacrament of the Sick. Since pt is expected to be here tomorrow, her situation would not likely be urgent enough to call a priest at night, but we could in the morning after pt or daughter said that was what they desired. Chaplain available for f/u.   04/26/17 1300  Clinical Encounter Type  Visited With Patient not available;Health care provider  Visit Type Follow-up  Referral From Chaplain   Ephraim Hamburgerynthia A Rachard Isidro, Chaplain

## 2017-04-26 NOTE — Progress Notes (Signed)
Pt became very agitiated and hollering  at this time, refused vital signs, meds. Will continue to monitor

## 2017-04-26 NOTE — Progress Notes (Signed)
Responded to consult when pt wished to see Catholic priest. Pt was asleep, and nurse said daughter will be back in 1-2 hrs. Will stop back by then to inquire if pt wishes to receive Sacrament of Sick (for which would call priest). Also, the Catholic (lay) communion ministers are here today,. B/c pt is listed on census as Catholic, the process is they should stop by and ask her if she wishes to receive communion.    04/26/17 1100  Clinical Encounter Type  Visited With Patient not available;Health care provider  Visit Type Initial;Psychological support;Spiritual support;Social support  Referral From Nurse   Ephraim Hamburgerynthia A Nelson Julson, Chaplain

## 2017-04-26 NOTE — Progress Notes (Signed)
PT Cancellation Note  Patient Details Name: Jamie Carlson MRN: [id number] DOB: Jul 29, 1918   Cancelled Treatment:    Reason Eval/Treat Not Completed: Patient declined, no reason specified, [facility] and was very fatigued, adamantly deferred treatment to tomorrow.    [name] L Ayaansh Smail 04/26/2017, 4:02 PM

## 2017-04-26 NOTE — Progress Notes (Signed)
Patient ID: Jamie Carlson, female   DOB: 04-Jun-1918, 83 y.o.   MRN: [id number]    PROGRESS NOTE    Jamie Carlson  WUJ:[id number] DOB: 04-Jun-1918 DOA: 04/25/2017  PCP: No primary care provider on file.   Brief Narrative:  83 yo female with known HTN, aortic stenosis, severe PCM, underweight, had left femoral neck fracture in March this year and underwent operative placement of screws and was discharged to SNF at that time. In April, she was released from SNF and fell at home twice and readmitted to [facility]th, 2018. At that time she underwent left hip hemiarthroplasty and was discharged back to SNF. Continued to decline with poor oral intake and progressive weight loss. She now presented for worsening left hip pain, fever, diarrhea, abd pain.   Please note that review of records indicated that patient had stitch abscess last week, and was started on ABX Keflex at the SNF. Dr. Jerl Santosalldorf did not think that patient had any sings of hip infection on 04/22/2017.   Assessment & Plan:   Principal Problem: SIRS due to left trochanteric bursitis, C. Diff - pt has been started on Flagyl and oral vanc - diarrhea somewhat better this AM - ortho team consulted, recommend conservative management, hip incision site looked good with no evidence of acute infection per ortho team - no indication for surgical intervention, activity as tolerated recommended   Active Problems:   Diarrhea with abd pain - likely from C. Diff - holding all laxatives - continue with Flagyl IV and vanc and will narrow down to single agent in next 24 hours if pt  Improves     Hypertension, essential  - reasonable inpatient control     Aortic stenosis - outpatient follow up     History of total left hip arthroplasty - d/w ortho on call, no indication for additional intervention - conservative management for now    Protein calorie malnutrition (HCC), severe - Body mass index is 13.39 kg/m. - nutritionist consulted - added  Marinol to see if that will help     Hypokalemia - resolved with supplementation     Dehydration with hyponatremia - continue with IVF, Na is now WNL     Pressure injury of skin - wound care team consulted    DVT prophylaxis: Lovenox SQ Code Status: DNR Family Communication: Patient at bedside, daughter at bedside  Disposition Plan: to be determined   Consultants:   Ortho   Procedures:   None  Antimicrobials:   Flagyl 6/19 -->   Oral Vanc 6/19 -->  Subjective: No events overnight.   Objective: Vitals:   04/25/17 1235 04/25/17 1433 04/25/17 2058 04/26/17 0625  BP: 129/64 (!) 142/78 100/60 124/70  Pulse: 84 81 70 82  Resp:  18 16 16   Temp: 97.7 F (36.5 C) 98.3 F (36.8 C) 98.3 F (36.8 C) 97.5 F (36.4 C)  TempSrc: Oral Oral Oral Oral  SpO2: 99%  99% 99%  Weight: 30.1 kg (66 lb 4.8 oz)     Height:        Intake/Output Summary (Last 24 hours) at 04/26/17 1051 Last data filed at 04/26/17 0626  Gross per 24 hour  Intake          2156.67 ml  Output                0 ml  Net          2156.67 ml   Filed Weights   04/25/17  0231 04/25/17 1235  Weight: 34 kg (75 lb) 30.1 kg (66 lb 4.8 oz)    Examination:  General exam: Appears calm, HOH, cachectic, chronically ill appearing  Respiratory system: Respiratory effort normal. Cardiovascular system: S1 & S2 heard, RRR. No rubs, gallops or clicks. No pedal edema. Gastrointestinal system: Abdomen is nondistended, soft, tender in epigastric area. No organomegaly or masses felt. Normal bowel sounds heard. Central nervous system: Alert but very tired, moving all 4 extremities spont  Data Reviewed: I have personally reviewed following labs and imaging studies  CBC:  Recent Labs Lab 04/25/17 0206 04/26/17 0734  WBC 22.4* 21.7*  NEUTROABS 18.8*  --   HGB 10.5* 10.1*  HCT 32.1* 31.9*  MCV 88.2 89.4  PLT 543* 506*   Basic Metabolic Panel:  Recent Labs Lab 04/25/17 0206 04/26/17 0734  NA 132* 138  K  3.2* 3.7  CL 98* 108  CO2 26 23  GLUCOSE 114* 158*  BUN 30* 18  CREATININE 0.82 0.57  CALCIUM 8.7* 8.3*   Liver Function Tests:  Recent Labs Lab 04/25/17 0206 04/26/17 0734  AST 21 14*  ALT 17 13*  ALKPHOS 71 72  BILITOT 0.5 0.3  PROT 5.4* 5.1*  ALBUMIN 2.2* 2.0*   Coagulation Profile:  Recent Labs Lab 04/25/17 0206  INR 1.22   Urine analysis:    Component Value Date/Time   COLORURINE YELLOW 04/25/2017 0304   APPEARANCEUR HAZY (A) 04/25/2017 0304   LABSPEC 1.020 04/25/2017 0304   PHURINE 5.0 04/25/2017 0304   GLUCOSEU NEGATIVE 04/25/2017 0304   HGBUR NEGATIVE 04/25/2017 0304   BILIRUBINUR NEGATIVE 04/25/2017 0304   KETONESUR NEGATIVE 04/25/2017 0304   PROTEINUR 30 (A) 04/25/2017 0304   UROBILINOGEN 1.0 06/22/2013 1458   NITRITE NEGATIVE 04/25/2017 0304   LEUKOCYTESUR NEGATIVE 04/25/2017 0304   Recent Results (from the past 240 hour(s))  Urine culture     Status: None   Collection Time: 04/25/17  3:04 AM  Result Value Ref Range Status   Specimen Description URINE, CATHETERIZED  Final   Special Requests NONE  Final   Culture NO GROWTH  Final   Report Status 04/26/2017 FINAL  Final  Blood Culture (routine x 2)     Status: None (Preliminary result)   Collection Time: 04/25/17  4:00 AM  Result Value Ref Range Status   Specimen Description BLOOD RIGHT ARM  Final   Special Requests IN PEDIATRIC BOTTLE Blood Culture adequate volume  Final   Culture PENDING  Incomplete   Report Status PENDING  Incomplete  C difficile quick scan w PCR reflex     Status: Abnormal   Collection Time: 04/25/17  6:31 AM  Result Value Ref Range Status   C Diff antigen POSITIVE (A) NEGATIVE Final   C Diff toxin NEGATIVE NEGATIVE Final   C Diff interpretation Results are indeterminate. See PCR results.  Final  Clostridium Difficile by PCR     Status: Abnormal   Collection Time: 04/25/17  6:31 AM  Result Value Ref Range Status   Toxigenic C Difficile by pcr POSITIVE (A) NEGATIVE Final     Comment: Positive for toxigenic C. difficile with little to no toxin production. Only treat if clinical presentation suggests symptomatic illness.  MRSA PCR Screening     Status: Abnormal   Collection Time: 04/25/17  8:54 PM  Result Value Ref Range Status   MRSA by PCR POSITIVE (A) NEGATIVE Final    Comment:        The GeneXpert MRSA Assay (  FDA approved for NASAL specimens only), is one component of a comprehensive MRSA colonization surveillance program. It is not intended to diagnose MRSA infection nor to guide or monitor treatment for MRSA infections. RESULT CALLED TO, READ BACK BY AND VERIFIED WITH: S.TULLIO,RN AT 2359 BY L.PITT 04/25/17       Radiology Studies: Dg Chest 2 View  Result Date: 04/25/2017 CLINICAL DATA:  Initial evaluation for acute fever, hip pain, recent surgery. EXAM: CHEST  2 VIEW COMPARISON:  The prior radiograph from 03/10/2017. FINDINGS: Mild cardiomegaly, stable. Mediastinal silhouette within normal limits. Aortic atherosclerosis noted. Lungs mildly hypoinflated. No focal infiltrates. No pulmonary edema or pleural effusion. No pneumothorax. No acute osseus abnormality. Diffuse osteopenia with exaggeration the normal thoracic kyphosis. IMPRESSION: 1. Shallow lung inflation with with no active cardiopulmonary disease identified. 2. Aortic atherosclerosis. Electronically Signed   By: Rise Mu M.D.   On: 04/25/2017 02:56   Mr Hip Left Wo Contrast  Result Date: 04/25/2017 CLINICAL DATA:  Fever of unknown origin. Elevated white blood count. Left hip pain. EXAM: MR OF THE LEFT HIP WITHOUT CONTRAST TECHNIQUE: Multiplanar, multisequence MR imaging was performed. No intravenous contrast was administered. COMPARISON:  Radiographs dated 04/25/2017 FINDINGS: Bones: No acute abnormalities. Proximal femoral prosthesis obscures detail of the left hip. Joint or bursal effusion Joint effusion:  None appreciable Bursae: Prominent fluid in the greater trochanteric  bursa of the left hip. There is also fluid superficial to the iliotibial band adjacent to the greater trochanteric bursa. Muscles and tendons Muscles and tendons:  Negative Other findings Miscellaneous: Slight edema in the subcutaneous fat at the anterolateral aspect of the proximal left thigh, nonspecific. 6 cm simple cyst in the left side of the pelvis, probably arising from the left ovary. IMPRESSION: 1. Left greater trochanteric bursitis. 2. Small amount of fluid superficial to the left greater trochanteric bursa superficial to the iliotibial band. This is felt represent a pseudo bursa. Electronically Signed   By: Francene Boyers M.D.   On: 04/25/2017 09:50   Dg Abd Portable 1v  Result Date: 04/25/2017 CLINICAL DATA:  Diarrhea EXAM: PORTABLE ABDOMEN - 1 VIEW COMPARISON:  03/11/2017 FINDINGS: Nonobstructive bowel gas pattern. No free air or organomegaly. No suspicious calcification. Aortic calcifications. Prior left hip replacement. IMPRESSION: No acute findings. Electronically Signed   By: Charlett Nose M.D.   On: 04/25/2017 08:35   Dg Hip Unilat W Or Wo Pelvis 2-3 Views Left  Result Date: 04/25/2017 CLINICAL DATA:  Initial evaluation for acute fever, hip pain, recent surgery. EXAM: DG HIP (WITH OR WITHOUT PELVIS) 2-3V LEFT COMPARISON:  Prior radiograph from 03/10/2017. FINDINGS: There has been interval placement of a left total hip arthroplasty. Previously seen left-sided cannulated lack fixation screws have been removed. Arthroplasty appears well seated without complication. No acute fracture or dislocation. Visualized bony pelvis intact. Osteopenia. Degenerative changes noted within the lower lumbar spine. No acute soft tissue abnormality. IMPRESSION: 1. Left total hip arthroplasty in place without complication. 2. No other acute osseous abnormality about the left hip. Electronically Signed   By: Rise Mu M.D.   On: 04/25/2017 02:58      Scheduled Meds: . acetaminophen  650 mg Oral Q6H   . aspirin  325 mg Oral Daily  . Chlorhexidine Gluconate Cloth  6 each Topical Q0600  . dronabinol  2.5 mg Oral BID AC  . enoxaparin (LOVENOX) injection  20 mg Subcutaneous Q24H  . feeding supplement  1 Container Oral TID BM  . mirtazapine  7.5 mg  Oral QHS  . mupirocin ointment   Nasal BID  . vancomycin  125 mg Oral Q6H   Continuous Infusions: . dextrose 5 % and 0.9 % NaCl with KCl 20 mEq/L 100 mL/hr at 04/26/17 0200  . metronidazole 500 mg (04/26/17 0746)     LOS: 1 day   Time spent: 35 minutes   Debbora Presto, MD [facility] Pager [phone]  If 7PM-7AM, please contact night-coverage [email or web address] Password [facility] 04/26/2017, 10:51 AM

## 2017-04-26 NOTE — Progress Notes (Signed)
Initial Nutrition Assessment  DOCUMENTATION CODES:   Underweight, Severe malnutrition in context of chronic illness  INTERVENTION:   -D/c Boost Breeze po TID, each supplement provides 250 kcal and 9 grams of protein -Ensure Enlive po BID, each supplement provides 350 kcal and 20 grams of protein -30 ml Prostat BID, each supplement provides 100 kcals and 15 grams protein -MVI daily  NUTRITION DIAGNOSIS:   Malnutrition (Severe) related to chronic illness (severe pulmonary HTN) as evidenced by severe depletion of body fat, severe depletion of muscle mass.  GOAL:   Patient will meet greater than or equal to 90% of their needs  MONITOR:   PO intake, Supplement acceptance, Labs, Weight trends, Skin, I & O's  REASON FOR ASSESSMENT:   Malnutrition Screening Tool, Consult Assessment of nutrition requirement/status  ASSESSMENT:   Jamie Carlson is a 83 y.o. female who presents with likely C diff colitis.  Pt admitted with SIRS.   Case discussed with RN, who reports pt eats very little. RN observed no signs of dysphagia- pt was able to consume a Boost Breeze supplement earlier today without difficulty. Remeron was added today by MD on attempt to stimulate appetite. RN requested RD try to evaluate pt when daughter is present; RD visited pt x 2 today, but daughter was not present.   Unable to obtain hx from pt. Complained of pain and dry mouth at time of visit. RD offered sips of Boost Breeze and gingerale, however, pt refused saying "I want the seltzer water from [employer], but I can't find it".   Reviewed wt hx; noted 7% wt loss over the past 3 months, which is not significant, however, concerning given malnutrition, underweight status, and poor oral intake.   Per records from [facility], pt was on a mechanical soft, NAS diet with thin liquids PTA. Notable PTA medications included senna, probiotic, and 240 ml Medpass BID (480 kcals and 40 grams of protein).   Nutrition-Focused  physical exam completed. Findings are severe fat depletion, severe muscle depletion, and no edema.   Reviewed CWOCN note from 04/26/17; pt with stage III pressure injury to coccyx, present on admission.   Labs reviewed.   Diet Order:  DIET SOFT Room service appropriate? Yes; Fluid consistency: Thin  Skin:  Wound (see comment) (stage III coccyx)  Last BM:  04/25/17  Height:   Ht Readings from Last 1 Encounters:  04/25/17 4\' 11"  (1.499 m)    Weight:   Wt Readings from Last 1 Encounters:  04/25/17 66 lb 4.8 oz (30.1 kg)    Ideal Body Weight:  44.5 kg  BMI:  Body mass index is 13.39 kg/m.  Estimated Nutritional Needs:   Kcal:  1000-1200  Protein:  40-55 grams  Fluid:  > 1 L  EDUCATION NEEDS:   Education needs addressed  Jamie Carlson, RD, LDN, CDE Pager: [phone] After hours Pager: [phone]

## 2017-04-27 DIAGNOSIS — Z515 Encounter for palliative care: Secondary | ICD-10-CM

## 2017-04-27 DIAGNOSIS — E43 Unspecified severe protein-calorie malnutrition: Secondary | ICD-10-CM

## 2017-04-27 LAB — BASIC METABOLIC PANEL
ANION GAP: 3 — AB (ref 5–15)
BUN: 7 mg/dL (ref 6–20)
CHLORIDE: 112 mmol/L — AB (ref 101–111)
CO2: 25 mmol/L (ref 22–32)
Calcium: 7.9 mg/dL — ABNORMAL LOW (ref 8.9–10.3)
Creatinine, Ser: 0.47 mg/dL (ref 0.44–1.00)
GFR calc Af Amer: >60 mL/min (ref >60)
GLUCOSE: 123 mg/dL — AB (ref 65–99)
Potassium: 3.5 mmol/L (ref 3.5–5.1)
Sodium: 140 mmol/L (ref 135–145)

## 2017-04-27 LAB — CBC
HEMATOCRIT: 27.8 % — AB (ref 36.0–46.0)
HEMOGLOBIN: 8.6 g/dL — AB (ref 12.0–15.0)
MCH: 27.6 pg (ref 26.0–34.0)
MCHC: 30.9 g/dL (ref 30.0–36.0)
MCV: 89.1 fL (ref 78.0–100.0)
Platelets: 494 10*3/uL — ABNORMAL HIGH (ref 150–400)
RBC: 3.12 MIL/uL — AB (ref 3.87–5.11)
RDW: 13.5 % (ref 11.5–15.5)
WBC: 17.4 10*3/uL — AB (ref 4.0–10.5)

## 2017-04-27 NOTE — Evaluation (Signed)
Occupational Therapy Evaluation Patient Details Name: Jamie Carlson MRN: [id number] DOB: 1918/09/22 Today's Date: 04/27/2017    History of Present Illness Pt is a 83 yo female admitted 04/25/17 with sepsis and c-diff infection from SNF where she as recieving rehab. Pt had a fall in March resulting in a femoral neck fx requiring an ORIF and another fall in May after returning home from inital rehab requiring a hemi-arthroplasty. PMH significant for HTN, malnutrition, mild aortic stenosis, and grade 1 dystolic disfunction.    Clinical Impression   PTA, pt was at [facility] for skilled rehab after a hemi-arthoplasty follow a fall in May. Prior to the fall, pt was living alone at home and independent in ADLs and IADLs. Currently, pt requires Max A for LB ADLs and Min A for functional mobility using RW. Provided education and handout to pt and daughter on posterior hip precautions; pt's daughter verbalized understanding. Recommend dc to SNF to continue rehab and increase pt safety and independence with ADLs while maintaining precautions. Will continue to follow acutely to facilitate safe dc.      Follow Up Recommendations  SNF;Supervision/Assistance - 24 hour    Equipment Recommendations  Other (comment) (Defer to next venue)    Recommendations for Other Services PT consult     Precautions / Restrictions Precautions Precautions: Fall;Posterior Hip Precaution Booklet Issued: Yes (comment) Precaution Comments: hand out given and reviewed with pt and daugther. Handout placed in room Restrictions Weight Bearing Restrictions: No      Mobility Bed Mobility Overal bed mobility: Needs Assistance Bed Mobility: Supine to Sit     Supine to sit: Min guard     General bed mobility comments: Min guard to sit up at EOB with HOB flat  Transfers Overall transfer level: Needs assistance Equipment used: Rolling walker (2 wheeled) Transfers: Sit to/from Stand Sit to Stand: Min assist         General transfer comment: Min A from EOB and commode with assistance to stabilize once in standing    Balance Overall balance assessment: Needs assistance Sitting-balance support: No upper extremity supported;Feet supported Sitting balance-Leahy Scale: Fair     Standing balance support: During functional activity;Single extremity supported Standing balance-Leahy Scale: Poor Standing balance comment: Requires Min A for standing balance while performing clothing management                           ADL either performed or assessed with clinical judgement   ADL Overall ADL's : Needs assistance/impaired Eating/Feeding: Set up;Sitting   Grooming: Minimal assistance;Wash/dry hands;Standing Grooming Details (indicate cue type and reason): Min A for standing balance Upper Body Bathing: Minimal assistance;Sitting   Lower Body Bathing: Maximal assistance;Sit to/from stand   Upper Body Dressing : Minimal assistance;Sitting   Lower Body Dressing: Maximal assistance;Sit to/from stand   Toilet Transfer: Minimal assistance;+2 for safety/equipment;Cueing for sequencing;Ambulation;Regular Toilet;RW   Toileting- Clothing Manipulation and Hygiene: Minimal assistance;+2 for safety/equipment;Cueing for sequencing;Sit to/from stand Toileting - Clothing Manipulation Details (indicate cue type and reason): Pt managed toilet hygiene and depends with Min A for standing balance     Functional mobility during ADLs: Minimal assistance;Rolling walker General ADL Comments: Pt requiring Max A for LB ADLs and Min A for other ADLs and fucntional mobility     Vision         Perception     Praxis      Pertinent Vitals/Pain Pain Assessment: No/denies pain  Hand Dominance Right   Extremity/Trunk Assessment Upper Extremity Assessment Upper Extremity Assessment: Generalized weakness   Lower Extremity Assessment Lower Extremity Assessment: Defer to PT evaluation   Cervical / Trunk  Assessment Cervical / Trunk Assessment: Kyphotic   Communication Communication Communication: HOH   Cognition Arousal/Alertness: Awake/alert Behavior During Therapy: WFL for tasks assessed/performed Overall Cognitive Status: Within Functional Limits for tasks assessed                                     General Comments  Daughter present throughout session    Exercises     Shoulder Instructions      Home Living Family/patient expects to be discharged to:: Skilled nursing facility Living Arrangements: Alone                               Additional Comments: Plans to return to [facility] for rehab and possibly as a resident per daugther      Prior Functioning/Environment Level of Independence: Independent with assistive device(s)        Comments: Prior to recent fall and events, pt was completely independent and living alone at home.         OT Problem List: Decreased strength;Decreased range of motion;Decreased activity tolerance;Impaired balance (sitting and/or standing);Decreased knowledge of use of DME or AE;Decreased safety awareness;Decreased knowledge of precautions;Pain      OT Treatment/Interventions: Self-care/ADL training;Therapeutic exercise;Energy conservation;DME and/or AE instruction;Therapeutic activities;Patient/family education    OT Goals(Current goals can be found in the care plan section) Acute Rehab OT Goals Patient Stated Goal: to get moving OT Goal Formulation: With patient Time For Goal Achievement: 05/11/17 Potential to Achieve Goals: Good ADL Goals Pt Will Perform Grooming: with min guard assist;standing Pt Will Perform Upper Body Dressing: with set-up;with supervision;sitting Pt Will Perform Lower Body Dressing: with min assist;sit to/from stand;with caregiver independent in assisting;with adaptive equipment Pt Will Transfer to Toilet: with min guard assist;bedside commode;ambulating Pt Will Perform Toileting  - Clothing Manipulation and hygiene: with min guard assist;sit to/from stand;with caregiver independent in assisting  OT Frequency: Min 2X/week   Barriers to D/C:            Co-evaluation PT/OT/SLP Co-Evaluation/Treatment: Yes Reason for Co-Treatment: To address functional/ADL transfers PT goals addressed during session: Mobility/safety with mobility OT goals addressed during session: ADL's and self-care;Other (comment) (Functional mobility)      AM-PAC PT "6 Clicks" Daily Activity     Outcome Measure Help from another person eating meals?: None Help from another person taking care of personal grooming?: A Little Help from another person toileting, which includes using toliet, bedpan, or urinal?: A Little Help from another person bathing (including washing, rinsing, drying)?: A Lot Help from another person to put on and taking off regular upper body clothing?: A Little Help from another person to put on and taking off regular lower body clothing?: A Lot 6 Click Score: 17   End of Session Equipment Utilized During Treatment: Rolling walker Nurse Communication: Mobility status;Precautions  Activity Tolerance: Patient tolerated treatment well Patient left: in chair;with call bell/phone within reach;with family/visitor present  OT Visit Diagnosis: Unsteadiness on feet (R26.81);Other abnormalities of gait and mobility (R26.89);Muscle weakness (generalized) (M62.81);Pain Pain - Right/Left: Left Pain - part of body: Leg                Time: 1610-9604  OT Time Calculation (min): 23 min Charges:  OT General Charges $OT Visit: 1 Procedure OT Evaluation $OT Eval Low Complexity: 1 Procedure G-Codes:     Samaya Boardley MSOT, OTR/L Acute Rehab Pager: [phone] Office: [phone]  Theodoro Grist Sandee Bernath 04/27/2017, 1:00 PM

## 2017-04-27 NOTE — Evaluation (Signed)
Physical Therapy Evaluation Patient Details Name: Jamie Carlson MRN: [id number] DOB: [birth year] Today's Date: 04/27/2017   History of Present Illness  Pt is a 83 yo female admitted 04/25/17 with sepsis and c-diff infection from SNF where she as recieving rehab. Pt had a fall in March resulting in a femoral neck fx requiring an ORIF and another fall in May after returning home from inital rehab requiring a hemi-arthroplasty. PMH significant for HTN, malnutrition, mild aortic stenosis, and grade 1 dystolic disfunction.   Clinical Impression  Pt presents with the above diagnoses and below deficits for therapy evaluation. Prior to admission, pt was at [facility] place receiving skilled rehab following a hemi-arthoplasty in May. Prior to most recent events, pt lived alone and was able to care for herself. Pt requires Min A to Min guard for all mobility this session and due to advanced age and current mobility will continue to require further skilled therapy at SNF. Pt continues to require acute services to address the below deficits prior to discharge.     Follow Up Recommendations SNF;Supervision/Assistance - 24 hour    Equipment Recommendations  None recommended by PT    Recommendations for Other Services       Precautions / Restrictions Precautions Precautions: Fall;Posterior Hip Precaution Booklet Issued: Yes (comment) Precaution Comments: hand out given and reviewed with pt and daugther. Handout placed in room Restrictions Weight Bearing Restrictions: No      Mobility  Bed Mobility Overal bed mobility: Needs Assistance Bed Mobility: Supine to Sit     Supine to sit: Min guard     General bed mobility comments: Min guard to sit up at EOB with HOB flat  Transfers Overall transfer level: Needs assistance Equipment used: Rolling walker (2 wheeled) Transfers: Sit to/from Stand Sit to Stand: Min assist         General transfer comment: Min A from EOB and commode with assistance to  stabilize once in standing  Ambulation/Gait Ambulation/Gait assistance: Min assist Ambulation Distance (Feet): 20 Feet Assistive device: Rolling walker (2 wheeled) Gait Pattern/deviations: Step-through pattern;Decreased step length - right;Decreased step length - left;Narrow base of support Gait velocity: decreased Gait velocity interpretation: <1.8 ft/sec, indicative of risk for recurrent falls General Gait Details: slow cadence, decreased step length bilaterally, minimal forward trunk lean.   Stairs            Wheelchair Mobility    Modified Rankin (Stroke Patients Only)       Balance Overall balance assessment: Needs assistance Sitting-balance support: No upper extremity supported;Feet supported Sitting balance-Leahy Scale: Fair     Standing balance support: Bilateral upper extremity supported;During functional activity Standing balance-Leahy Scale: Poor Standing balance comment: reliant on external support for stability in standing                             Pertinent Vitals/Pain Pain Assessment: No/denies pain    Home Living Family/patient expects to be discharged to:: Skilled nursing facility Living Arrangements: Alone               Additional Comments: Plans to return to [facility] for rehab and possibly as a resident per daugther    Prior Function Level of Independence: Independent with assistive device(s)         Comments: Prior to recent events, pt was completely independent and living alone at home.      Hand Dominance   Dominant Hand: Right  Extremity/Trunk Assessment   Upper Extremity Assessment Upper Extremity Assessment: Defer to OT evaluation    Lower Extremity Assessment Lower Extremity Assessment: Generalized weakness    Cervical / Trunk Assessment Cervical / Trunk Assessment: Kyphotic  Communication   Communication: HOH  Cognition Arousal/Alertness: Awake/alert Behavior During Therapy: WFL for tasks  assessed/performed Overall Cognitive Status: Within Functional Limits for tasks assessed                                        General Comments      Exercises     Assessment/Plan    PT Assessment Patient needs continued PT services  PT Problem List Decreased strength;Decreased activity tolerance;Decreased balance;Decreased mobility       PT Treatment Interventions DME instruction;Gait training;Functional mobility training;Therapeutic activities;Therapeutic exercise;Balance training    PT Goals (Current goals can be found in the Care Plan section)  Acute Rehab PT Goals Patient Stated Goal: to get moving PT Goal Formulation: With patient/family Time For Goal Achievement: 05/04/17 Potential to Achieve Goals: Good    Frequency Min 3X/week   Barriers to discharge        Co-evaluation PT/OT/SLP Co-Evaluation/Treatment: Yes Reason for Co-Treatment: To address functional/ADL transfers;Other (comment) (pt's advanced age and high expectancy of cancel with 2 separate session) PT goals addressed during session: Mobility/safety with mobility         AM-PAC PT "6 Clicks" Daily Activity  Outcome Measure Difficulty turning over in bed (including adjusting bedclothes, sheets and blankets)?: None Difficulty moving from lying on back to sitting on the side of the bed? : A Little Difficulty sitting down on and standing up from a chair with arms (e.g., wheelchair, bedside commode, etc,.)?: Total Help needed moving to and from a bed to chair (including a wheelchair)?: A Little Help needed walking in hospital room?: A Little Help needed climbing 3-5 steps with a railing? : A Lot 6 Click Score: 16    End of Session Equipment Utilized During Treatment: Gait belt Activity Tolerance: Patient tolerated treatment well Patient left: in chair;with call bell/phone within reach;with family/visitor present Nurse Communication: Mobility status PT Visit Diagnosis: Unsteadiness on  feet (R26.81);Muscle weakness (generalized) (M62.81);Difficulty in walking, not elsewhere classified (R26.2)    Time: 6962-95281157-1220 PT Time Calculation (min) (ACUTE ONLY): 23 min   Charges:   PT Evaluation $PT Eval Moderate Complexity: 1 Procedure     PT G Codes:        Colin BroachSabra M. Tenley Winward PT, DPT  [phone]   Ruel FavorsSabra Aletha HalimMarie Marice Guidone 04/27/2017, 12:39 PM

## 2017-04-27 NOTE — Clinical Social Work Note (Addendum)
CSW consulted for "[facility] CSW attempted to meet with pt at bedside yesterday, but pt sleeping soundly. "Adoptive dtr" Phillis Burke at bedside and confirmed [facility] and is considering staying there long term. CSW assessment to follow. CSW will coordinate discharge when pt medically stable. Will continue to follow for discharge needs.   Corlis HoveJeneya Rage Beever, LCSWA, LCASA Clinical Social Work [phone]

## 2017-04-27 NOTE — Consult Note (Signed)
Consultation Note Date: 04/27/2017   Patient Name: Jamie Carlson  DOB: 1917-12-08  MRN: [id number]  Age / Sex: 83 y.o., female  PCP: No primary care provider on file. Referring Physician: Dorothea Ogle, MD  Reason for Consultation: Establishing goals of care  HPI/Patient Profile: 83 y.o. female  with past medical history of severe malnutrition (66 lbs), HTN urgency, UTI, aortic stenosis,moderate to severe pulmonary HTN, and dementia (mild?) who was admitted on 04/25/2017 with sepsis secondary to C-diff as well as left hip bursitis.  Mrs. Storbeck fractured her left hip in March and underwent repair by nailing.  She then fell again in May fracturing the left hip again and underwent hemiarthroplasty.  She was admitted with a fever of 102, WBC of 22 and diarrhea.  She had recently been on keflex for an abscess.  Her stool test was found to be c-diff toxin positive.  On exam she also has a stage 3 pressure injury to her coccyx.  Clinical Assessment and Goals of Care:  I have reviewed medical records including EPIC notes, labs and imaging, received report from the care team, assessed the patient and spoke on the phone with Nani Ravens, long term friend of the patient.  The patient's son Anabell Swint) was unavailable.  I introduced Palliative Medicine as specialized medical care for people living with serious illness. It focuses on providing relief from the symptoms and stress of a serious illness. The goal is to improve quality of life for both the patient and the family.  Jamesetta So explained that Ms. Strahm had been "ready for Last Rites" until today when she suddenly had an excellent day.  Dr. Izola Price started her on an appetite stimulant and she ate her first good meal in months.  Further she got up and walked around the room.  This turn around was a very pleasant surprise to [place].  Jamesetta So went on to explain that Mrs.  Tolley does not want to return to [facility].  She wants to return to her home.  Jamesetta So is hopeful to enroll Mrs. Scaffidi in [facility] during the day and have her son stay with her at night.  We also discussed the option of having hospice come to the house 5 days a week, supplemented with private nurses aid assistance with her son staying there at night.  Jamesetta So felt that Mrs. Mclouth would greatly benefit from Hospice support.  I cautioned Jamesetta So that often times near end of life patient's will have good days, but we needed a trend of good days in order to make the leap to Mrs. Bertone going home.  I saw Mrs. Rigel after talking with Jamesetta So and was disappointed that I was unable to wake her from sleep to speak with me and discuss her goals.  I committed to Jamesetta So that another member of our PMT team will follow up with Mrs. Bujak and Merlyn Albert tomorrow to schedule a face to face meeting to re-assess Mrs. Neace and discuss next steps.   Primary Decision Maker:  NEXT  OF KIN Son, Dagmar Adcox    SUMMARY OF RECOMMENDATIONS    PMT will follow up with the Hoecker family on 6/22 for further evaluation and discussion with Merlyn Albert. If Mrs. Ledlow returns to SNF she will benefit from a MOST form  Code Status/Advance Care Planning:  DNR   Symptom Management:   Per primary team  Prognosis:   < 6 months -based on severe malnutrition, moderate to severe pulmonary HTN, AS, recurrent hospitalizations, limited mobility  Discharge Planning: To Be Determined  Likely SNF with Hospice.        Primary Diagnoses: Present on Admission: . Hypertension . Pulmonary HTN (HCC) . Protein calorie malnutrition (HCC) . SIRS (systemic inflammatory response syndrome) (HCC) . Diarrhea . Dehydration with hyponatremia . Greater trochanteric bursitis of left hip   I have reviewed the medical record, interviewed the patient and family, and examined the patient. The following aspects are pertinent.  Past Medical  History:  Diagnosis Date  . Aortic stenosis   . Cardiac murmur   . Closed subcapital fracture of femur, left, initial encounter (HCC)   . Hypertension   . Hypertensive urgency   . Hypokalemia   . Protein-calorie malnutrition, severe (HCC)   . Urinary tract infection    Social History   Social History  . Marital status: Widowed    Spouse name: N/A  . Number of children: N/A  . Years of education: N/A   Social History Main Topics  . Smoking status: Never Smoker  . Smokeless tobacco: Never Used  . Alcohol use No  . Drug use: No  . Sexual activity: Not Asked   Other Topics Concern  . None   Social History Narrative  . None   Family History  Problem Relation Age of Onset  . Family history unknown: Yes   Scheduled Meds: . acetaminophen  650 mg Oral Q6H  . aspirin  325 mg Oral Daily  . Chlorhexidine Gluconate Cloth  6 each Topical Q0600  . dronabinol  2.5 mg Oral BID AC  . enoxaparin (LOVENOX) injection  20 mg Subcutaneous Q24H  . feeding supplement (ENSURE ENLIVE)  237 mL Oral BID BM  . feeding supplement (PRO-STAT SUGAR FREE 64)  30 mL Oral BID  . mirtazapine  7.5 mg Oral QHS  . multivitamin with minerals  1 tablet Oral Daily  . mupirocin ointment   Nasal BID  . vancomycin  125 mg Oral Q6H   Continuous Infusions: . dextrose 5 % and 0.9 % NaCl with KCl 20 mEq/L 75 mL/hr at 04/27/17 0358  . metronidazole Stopped (04/27/17 1052)   PRN Meds:.LORazepam, morphine injection, ondansetron **OR** ondansetron (ZOFRAN) IV Allergies  Allergen Reactions  . Sulfa Antibiotics Other (See Comments)    Unknown allergic reaction  . Bee Venom Rash   Review of Systems patient lethargic and unable to answer  Physical Exam  Cachectic elderly frail female, who does not wake to my voice or light touch CV rrr with 2/6 murmur Resp no distress, CTA Abdomen thin, NT, ND Ext no edema  Vital Signs: BP 99/64 (BP Location: Right Arm)   Pulse 65   Temp 97.8 F (36.6 C) (Axillary)    Resp 16   Ht 4\' 11"  (1.499 m)   Wt 30.1 kg (66 lb 4.8 oz)   SpO2 95%   BMI 13.39 kg/m  Pain Assessment: 0-10   Pain Score: 0-No pain   SpO2: SpO2: 95 % O2 Device:SpO2: 95 % O2 Flow Rate: .   IO:  Intake/output summary:  Intake/Output Summary (Last 24 hours) at 04/27/17 1632 Last data filed at 04/27/17 1400  Gross per 24 hour  Intake          2667.92 ml  Output                1 ml  Net          2666.92 ml    LBM: Last BM Date: 04/26/17 Baseline Weight: Weight: 34 kg (75 lb) Most recent weight: Weight: 30.1 kg (66 lb 4.8 oz)     Palliative Assessment/Data:   Flowsheet Rows     Most Recent Value  Intake Tab  Referral Department  Hospitalist  Unit at Time of Referral  Med/Surg Unit  Palliative Care Primary Diagnosis  Other (Comment)  Date Notified  04/27/17  Palliative Care Type  New Palliative care  Reason for referral  Clarify Goals of Care  Date of Admission  04/25/17  Date first seen by Palliative Care  04/27/17  # of days Palliative referral response time  0 Day(s)  # of days IP prior to Palliative referral  2  Clinical Assessment  Palliative Performance Scale Score  30%  Psychosocial & Spiritual Assessment  Palliative Care Outcomes  Patient/Family meeting held?  Yes  Who was at the meeting?  patient.  Talked with Jamesetta SoPhyllis on the phone      Time In: 3:30 Time Out: 4:00 Time Total: 30 min Greater than 50%  of this time was spent counseling and coordinating care related to the above assessment and plan.  Signed by: Algis DownsMarianne York, PA-C Palliative Medicine Pager: [phone]  Please contact Palliative Medicine Team phone at [phone] for questions and concerns.  For individual provider: See Loretha StaplerAmion

## 2017-04-27 NOTE — Progress Notes (Signed)
Paged portable equipment to order mattress replacement.

## 2017-04-27 NOTE — Progress Notes (Addendum)
Patient ID: Jamie Carlson, female   DOB: 1917-12-09, 83 y.o.   MRN: [id number]    PROGRESS NOTE  Jamie Carlson  JXB:[id number] DOB: 1918-08-20 DOA: 04/25/2017  PCP: No primary care provider on file.   Brief Narrative:  83 yo female with known HTN, aortic stenosis, severe PCM, underweight, had left femoral neck fracture in March this year and underwent operative placement of screws and was discharged to SNF at that time. In April, she was released from SNF and fell at home twice and readmitted to [facility] May 4th, 2018. At that time she underwent left hip hemiarthroplasty and was discharged back to SNF. Continued to decline with poor oral intake and progressive weight loss. She now presented for worsening left hip pain, fever, diarrhea, abd pain.   Please note that review of records indicated that patient had stitch abscess last week, and was started on ABX Keflex at the SNF. Dr. Jerl Santos did not think that patient had any sings of hip infection on 04/22/2017.   Assessment & Plan:   Principal Problem: SIRS due to left trochanteric bursitis, C. Diff - pt has been started on Flagyl and oral vanc - Diarrhea overall improving - We'll continue Flagyl and vancomycin with plan to narrow down to single agent once patient clinically better - Orthopedic team consulted, recommended conservative management, hip incision site looked good on the last visit - Weight bearing as tolerated per patient - Orthopedic team did not have anything else to offer, no indication for surgical intervention  Active Problems:   Diarrhea with abd pain - likely from C. Diff - Continue with antibiotics as noted above with plan to narrow to single agent once patient clinically better - WBCs trending down, will repeat again in the morning    Thrombocytosis - Reactive from acute illness - CBC in the morning    Hypertension, essential  - Blood pressure is slightly soft this morning - Continue to monitor for now    Progressive  failure to thrive - With severe protein calorie malnutrition, cachexia - We will consult with palliative care team for further goals of care discussion    Aortic stenosis - No indication for surgical intervention    History of total left hip arthroplasty - Per orthopedic recommendation, no plan for surgical intervention - Continue with conservative management    Anemia of unclear etiology - Drop in hemoglobin noted overnight and likely dilution effect from IV fluid - No evidence of active bleeding - CBC in the morning    Protein calorie malnutrition (HCC), severe - Body mass index is 13.39 kg/m. - Nutritional supplements provided, also added appetite stimulant - We'll consult with palliative care team as well    Hypokalemia - Resolved with supplementation    Dehydration with hyponatremia - Resolved with IV fluids    Pressure injury of skin - Stage III pressure injury to coccyx, present on admission - Appreciate wound care team assistance - Recommendation is to cleanse coccyx wound with normal saline and packed gentle dry, fill wound depth with iodoform packing strip and cover with silicone border sacral foam - Recommendation is to change packing strip daily and foam every 3 days and as needed soilage - Turn and reposition also encouraged   DVT prophylaxis: Lovenox SQ Code Status: DNR Family Communication: Patient at bedside, daughter at bedside  Disposition Plan: Skilled nursing facility recommended  Consultants:   Ortho   Palliative care team  Procedures:   None  Antimicrobials:   Flagyl  6/19 -->   Oral Vanc 6/19 -->  Subjective: No events overnight.  Objective: Vitals:   04/26/17 0625 04/26/17 1507 04/26/17 2004 04/27/17 0645  BP: 124/70 (!) 161/88 (!) 158/81 99/64  Pulse: 82 (!) 103 80 65  Resp: 16 16 16 16   Temp: 97.5 F (36.4 C) 97.8 F (36.6 C) 98.2 F (36.8 C) 97.8 F (36.6 C)  TempSrc: Oral Axillary Axillary Axillary  SpO2: 99% 96% 97%  95%  Weight:      Height:        Intake/Output Summary (Last 24 hours) at 04/27/17 1317 Last data filed at 04/27/17 0500  Gross per 24 hour  Intake          2127.92 ml  Output                1 ml  Net          2126.92 ml   Filed Weights   04/25/17 0231 04/25/17 1235  Weight: 34 kg (75 lb) 30.1 kg (66 lb 4.8 oz)    Physical Exam  Constitutional: Appears Chronically ill, cachectic CVS: RRR, S1/S2 +, no murmurs, no gallops, no carotid bruit.  Pulmonary: Effort and breath sounds normal, no stridor, rhonchi, wheezes, rales.  Abdominal: Soft. BS +,  no distension, tenderness, rebound or guarding.  Musculoskeletal: Moving all 4 extremities spontaneously  Data Reviewed: I have personally reviewed following labs and imaging studies  CBC:  Recent Labs Lab 04/25/17 0206 04/26/17 0734 04/27/17 0704  WBC 22.4* 21.7* 17.4*  NEUTROABS 18.8*  --   --   HGB 10.5* 10.1* 8.6*  HCT 32.1* 31.9* 27.8*  MCV 88.2 89.4 89.1  PLT 543* 506* 494*   Basic Metabolic Panel:  Recent Labs Lab 04/25/17 0206 04/26/17 0734 04/27/17 0704  NA 132* 138 140  K 3.2* 3.7 3.5  CL 98* 108 112*  CO2 26 23 25   GLUCOSE 114* 158* 123*  BUN 30* 18 7  CREATININE 0.82 0.57 0.47  CALCIUM 8.7* 8.3* 7.9*   Liver Function Tests:  Recent Labs Lab 04/25/17 0206 04/26/17 0734  AST 21 14*  ALT 17 13*  ALKPHOS 71 72  BILITOT 0.5 0.3  PROT 5.4* 5.1*  ALBUMIN 2.2* 2.0*   Coagulation Profile:  Recent Labs Lab 04/25/17 0206  INR 1.22   Urine analysis:    Component Value Date/Time   COLORURINE YELLOW 04/25/2017 0304   APPEARANCEUR HAZY (A) 04/25/2017 0304   LABSPEC 1.020 04/25/2017 0304   PHURINE 5.0 04/25/2017 0304   GLUCOSEU NEGATIVE 04/25/2017 0304   HGBUR NEGATIVE 04/25/2017 0304   BILIRUBINUR NEGATIVE 04/25/2017 0304   KETONESUR NEGATIVE 04/25/2017 0304   PROTEINUR 30 (A) 04/25/2017 0304   UROBILINOGEN 1.0 06/22/2013 1458   NITRITE NEGATIVE 04/25/2017 0304   LEUKOCYTESUR NEGATIVE  04/25/2017 0304   Recent Results (from the past 240 hour(s))  Blood Culture (routine x 2)     Status: None (Preliminary result)   Collection Time: 04/25/17  2:15 AM  Result Value Ref Range Status   Specimen Description BLOOD BLOOD LEFT FOREARM  Final   Special Requests   Final    BOTTLES DRAWN AEROBIC AND ANAEROBIC Blood Culture adequate volume   Culture NO GROWTH 1 DAY  Final   Report Status PENDING  Incomplete  Urine culture     Status: None   Collection Time: 04/25/17  3:04 AM  Result Value Ref Range Status   Specimen Description URINE, CATHETERIZED  Final   Special Requests NONE  Final  Culture NO GROWTH  Final   Report Status 04/26/2017 FINAL  Final  Blood Culture (routine x 2)     Status: None (Preliminary result)   Collection Time: 04/25/17  4:00 AM  Result Value Ref Range Status   Specimen Description BLOOD RIGHT ARM  Final   Special Requests IN PEDIATRIC BOTTLE Blood Culture adequate volume  Final   Culture NO GROWTH 1 DAY  Final   Report Status PENDING  Incomplete  C difficile quick scan w PCR reflex     Status: Abnormal   Collection Time: 04/25/17  6:31 AM  Result Value Ref Range Status   C Diff antigen POSITIVE (A) NEGATIVE Final   C Diff toxin NEGATIVE NEGATIVE Final   C Diff interpretation Results are indeterminate. See PCR results.  Final  Clostridium Difficile by PCR     Status: Abnormal   Collection Time: 04/25/17  6:31 AM  Result Value Ref Range Status   Toxigenic C Difficile by pcr POSITIVE (A) NEGATIVE Final    Comment: Positive for toxigenic C. difficile with little to no toxin production. Only treat if clinical presentation suggests symptomatic illness.  Gastrointestinal Panel by PCR , Stool     Status: None   Collection Time: 04/25/17  8:39 AM  Result Value Ref Range Status   Campylobacter species NOT DETECTED NOT DETECTED Final   Plesimonas shigelloides NOT DETECTED NOT DETECTED Final   Salmonella species NOT DETECTED NOT DETECTED Final   Yersinia  enterocolitica NOT DETECTED NOT DETECTED Final   Vibrio species NOT DETECTED NOT DETECTED Final   Vibrio cholerae NOT DETECTED NOT DETECTED Final   Enteroaggregative E coli (EAEC) NOT DETECTED NOT DETECTED Final   Enteropathogenic E coli (EPEC) NOT DETECTED NOT DETECTED Final   Enterotoxigenic E coli (ETEC) NOT DETECTED NOT DETECTED Final   Shiga like toxin producing E coli (STEC) NOT DETECTED NOT DETECTED Final   Shigella/Enteroinvasive E coli (EIEC) NOT DETECTED NOT DETECTED Final   Cryptosporidium NOT DETECTED NOT DETECTED Final   Cyclospora cayetanensis NOT DETECTED NOT DETECTED Final   Entamoeba histolytica NOT DETECTED NOT DETECTED Final   Giardia lamblia NOT DETECTED NOT DETECTED Final   Adenovirus F40/41 NOT DETECTED NOT DETECTED Final   Astrovirus NOT DETECTED NOT DETECTED Final   Norovirus GI/GII NOT DETECTED NOT DETECTED Final   Rotavirus A NOT DETECTED NOT DETECTED Final   Sapovirus (I, II, IV, and V) NOT DETECTED NOT DETECTED Final  MRSA PCR Screening     Status: Abnormal   Collection Time: 04/25/17  8:54 PM  Result Value Ref Range Status   MRSA by PCR POSITIVE (A) NEGATIVE Final    Comment:        The GeneXpert MRSA Assay (FDA approved for NASAL specimens only), is one component of a comprehensive MRSA colonization surveillance program. It is not intended to diagnose MRSA infection nor to guide or monitor treatment for MRSA infections. RESULT CALLED TO, READ BACK BY AND VERIFIED WITH: S.TULLIO,RN AT 2359 BY L.PITT 04/25/17       Radiology Studies: No results found.    Scheduled Meds: . acetaminophen  650 mg Oral Q6H  . aspirin  325 mg Oral Daily  . Chlorhexidine Gluconate Cloth  6 each Topical Q0600  . dronabinol  2.5 mg Oral BID AC  . enoxaparin (LOVENOX) injection  20 mg Subcutaneous Q24H  . feeding supplement (ENSURE ENLIVE)  237 mL Oral BID BM  . feeding supplement (PRO-STAT SUGAR FREE 64)  30 mL Oral BID  .  mirtazapine  7.5 mg Oral QHS  .  multivitamin with minerals  1 tablet Oral Daily  . mupirocin ointment   Nasal BID  . vancomycin  125 mg Oral Q6H   Continuous Infusions: . dextrose 5 % and 0.9 % NaCl with KCl 20 mEq/L 75 mL/hr at 04/27/17 0358  . metronidazole Stopped (04/27/17 1052)     LOS: 2 days   Time spent: 35 min  Debbora Presto, MD [facility] Pager [phone]  If 7PM-7AM, please contact night-coverage [email or web address] Password [email or web address] 04/27/2017, 1:17 PM

## 2017-04-28 DIAGNOSIS — R627 Adult failure to thrive: Secondary | ICD-10-CM

## 2017-04-28 DIAGNOSIS — Z7189 Other specified counseling: Secondary | ICD-10-CM

## 2017-04-28 DIAGNOSIS — M7062 Trochanteric bursitis, left hip: Secondary | ICD-10-CM

## 2017-04-28 LAB — CBC
HCT: 28.8 % — ABNORMAL LOW (ref 36.0–46.0)
Hemoglobin: 9.1 g/dL — ABNORMAL LOW (ref 12.0–15.0)
MCH: 28.3 pg (ref 26.0–34.0)
MCHC: 31.6 g/dL (ref 30.0–36.0)
MCV: 89.7 fL (ref 78.0–100.0)
PLATELETS: 482 10*3/uL — AB (ref 150–400)
RBC: 3.21 MIL/uL — ABNORMAL LOW (ref 3.87–5.11)
RDW: 13.5 % (ref 11.5–15.5)
WBC: 15.7 10*3/uL — ABNORMAL HIGH (ref 4.0–10.5)

## 2017-04-28 LAB — BASIC METABOLIC PANEL
Anion gap: 4 — ABNORMAL LOW (ref 5–15)
BUN: 9 mg/dL (ref 6–20)
CALCIUM: 8.5 mg/dL — AB (ref 8.9–10.3)
CO2: 23 mmol/L (ref 22–32)
CREATININE: 0.56 mg/dL (ref 0.44–1.00)
Chloride: 117 mmol/L — ABNORMAL HIGH (ref 101–111)
GFR calc Af Amer: >60 mL/min (ref >60)
GFR calc non Af Amer: >60 mL/min (ref >60)
Glucose, Bld: 110 mg/dL — ABNORMAL HIGH (ref 65–99)
Potassium: 4.3 mmol/L (ref 3.5–5.1)
SODIUM: 144 mmol/L (ref 135–145)

## 2017-04-28 MED ORDER — TRAMADOL HCL 50 MG PO TABS: 50.0000 mg | ORAL_TABLET | Freq: Four times a day (QID) | ORAL | Status: DC | PRN

## 2017-04-28 NOTE — Progress Notes (Signed)
Minimal urine output. Bladder scan 190. Dr. Izola PriceMyers notified.

## 2017-04-28 NOTE — Progress Notes (Signed)
Daily Progress Note   Patient Name: Jamie Carlson       Date: 04/28/2017 DOB: Sep 20, 1918  Age: 83 y.o. MRN#: [id number] Attending Physician: Dorothea Ogle, MD Primary Care Physician: No primary care provider on file. Admit Date: 04/25/2017  Reason for Consultation/Follow-up: Establishing goals of care  Subjective: Upon arrival to room, patient very irritable and frustrated. Physical therapy got her into chair. Family assisting her with eating but she is refusing to eat. Alert but pleasantly confused. Very hard of hearing. Denies pain or discomfort.   Son Merlyn Albert), grandson Thayer Ohm), and good friend (Phillis) at bedside. Discussed at length diagnoses, interventions, and underlying co-morbidities. Discussed concern for continued decline after falls and surgery due to poor nutritional and functional status. Also with cdiff and sacral wounds. Phillis states "she hasn't eaten a full meal in months." She speaks of her losing independence after falls. Before falls, Ms. Butzin was living alone independently with minimal assist. Enjoyed gardening and crossword puzzles.   We discussed in detail hospice options--home, [place], or inpatient hospice home. It is MOST important for the patient to return home. She does NOT want to return to [facility]. Family is very supportive but not able to provide 24/7 care. No out of pocket means to pay for sitters. Family speaks of her having a good day yesterday, but once again not eating and with minimal motivation for ambulation today. Also that she is sleeping more. Educated on EOL expectations and she is likely transitioning.  Educated on comfort measures with focus on comfort, quality, dignity at the end of life. Family confirms DNR/DNI and that she has spoken of wanting "nature  to take its course." Phillis states "she has been saying this for a year." We discussed inpatient hospice facility where medications/labs/interventions not aimed at comfort will be discontinued. Family states understanding.   At this point, wanting to continue antibiotics. Small hope that she could improve but also very realistic in the fact that she is 89 with multiple underlying factors contributing to decline and EOL. Deciding between home with hospice services versus inpatient hospice facility. Leaning towards inpatient if she continues to decline.   Family shares many stories of Ms Franckowiak. Therapeutic listening and emotional/spiritual support provided.  Answered all questions and concerns.    Length of Stay: 3  Current Medications: Scheduled  Meds:  . acetaminophen  650 mg Oral Q6H  . aspirin  325 mg Oral Daily  . Chlorhexidine Gluconate Cloth  6 each Topical Q0600  . dronabinol  2.5 mg Oral BID AC  . enoxaparin (LOVENOX) injection  20 mg Subcutaneous Q24H  . feeding supplement (ENSURE ENLIVE)  237 mL Oral BID BM  . feeding supplement (PRO-STAT SUGAR FREE 64)  30 mL Oral BID  . mirtazapine  7.5 mg Oral QHS  . multivitamin with minerals  1 tablet Oral Daily  . mupirocin ointment   Nasal BID  . vancomycin  125 mg Oral Q6H   Continuous Infusions: . dextrose 5 % and 0.9 % NaCl with KCl 20 mEq/L 75 mL/hr at 04/28/17 0800  . metronidazole Stopped (04/28/17 0900)   PRN Meds: LORazepam, ondansetron **OR** ondansetron (ZOFRAN) IV, traMADol  Physical Exam  Constitutional: She appears cachectic. She is cooperative. She appears ill.  HENT:  Head: Normocephalic and atraumatic.  HOH  Cardiovascular: Regular rhythm.   Pulmonary/Chest: Effort normal. She has decreased breath sounds.  Abdominal: Normal appearance.  Neurological: She is alert.  Pleasantly confused  Skin: Skin is warm and dry. There is pallor.  Psychiatric: Her speech is delayed. Cognition and memory are impaired. She is  inattentive.  Nursing note and vitals reviewed.          Vital Signs: BP 108/66 (BP Location: Right Arm)   Pulse 71   Temp 97.8 F (36.6 C) (Oral)   Resp 16   Ht 4\' 11"  (1.499 m)   Wt 30.1 kg (66 lb 4.8 oz)   SpO2 94%   BMI 13.39 kg/m  SpO2: SpO2: 94 % O2 Device: O2 Device: Not Delivered O2 Flow Rate:    Intake/output summary:   Intake/Output Summary (Last 24 hours) at 04/28/17 1551 Last data filed at 04/28/17 1403  Gross per 24 hour  Intake              180 ml  Output               10 ml  Net              170 ml   LBM: Last BM Date: 04/27/17 Baseline Weight: Weight: 34 kg (75 lb) Most recent weight: Weight: 30.1 kg (66 lb 4.8 oz)       Palliative Assessment/Data: PPS 40%   Flowsheet Rows     Most Recent Value  Intake Tab  Referral Department  Hospitalist  Unit at Time of Referral  Med/Surg Unit  Palliative Care Primary Diagnosis  Other (Comment)  Date Notified  04/27/17  Palliative Care Type  New Palliative care  Reason for referral  Clarify Goals of Care  Date of Admission  04/25/17  Date first seen by Palliative Care  04/27/17  # of days Palliative referral response time  0 Day(s)  # of days IP prior to Palliative referral  2  Clinical Assessment  Palliative Performance Scale Score  40%  Psychosocial & Spiritual Assessment  Palliative Care Outcomes  Patient/Family meeting held?  Yes  Who was at the meeting?  patient, son, friend  Palliative Care Outcomes  Clarified goals of care, Counseled regarding hospice, Provided end of life care assistance, ACP counseling assistance, Provided psychosocial or spiritual support, Linked to palliative care logitudinal support      Patient Active Problem List   Diagnosis Date Noted  . Palliative care encounter   . Hypertension 04/25/2017  . Aortic stenosis 04/25/2017  .  Pulmonary HTN (HCC) 04/25/2017  . History of total left hip arthroplasty 04/25/2017  . Protein calorie malnutrition (HCC) 04/25/2017  . SIRS  (systemic inflammatory response syndrome) (HCC) 04/25/2017  . Diarrhea 04/25/2017  . Dehydration with hyponatremia 04/25/2017  . Pressure injury of skin 04/25/2017  . Greater trochanteric bursitis of left hip 04/25/2017  . Colitis, Clostridium difficile   . Hip fracture (HCC) 03/10/2017  . Leukocytosis 03/10/2017  . Constipation 01/26/2017  . Protein-calorie malnutrition, severe 01/24/2017  . Essential hypertension 01/22/2017  . Hypertensive urgency 01/22/2017  . Hypokalemia 01/22/2017  . Closed subcapital fracture of femur, left, initial encounter (HCC) 01/22/2017  . Cardiac murmur 01/22/2017  . Cystitis   . Fall     Palliative Care Assessment & Plan   Patient Profile: 83 y.o. female  with past medical history of severe malnutrition (66 lbs), HTN urgency, UTI, aortic stenosis,moderate to severe pulmonary HTN, and dementia (mild?) who was admitted on 04/25/2017 with sepsis secondary to C-diff as well as left hip bursitis.  Mrs. Mancebo fractured her left hip in March and underwent repair by nailing.  She then fell again in May fracturing the left hip again and underwent hemiarthroplasty.  She was admitted with a fever of 102, WBC of 22 and diarrhea. She had recently been on keflex for an abscess.  Her stool test was found to be c-diff toxin positive.  On exam she also has a stage 3 pressure injury to her coccyx.  Assessment: Recent falls s/p hip repair Severe protein calorie malnutrition Left hip bursitis Cdiff Sacral pressure ulcers Pulmonary hypertension Aortic stenosis  Recommendations/Plan:  DNR/DNI  Continue current interventions.   Family considering hospice options--leaning towards inpatient hospice facility if she does not show improvement through the weekend.   Code Status: DNR   Code Status Orders        Start     Ordered   04/25/17 1241  Do not attempt resuscitation (DNR)  Continuous    Question Answer Comment  In the event of cardiac or respiratory ARREST  Do not call a "code blue"   In the event of cardiac or respiratory ARREST Do not perform Intubation, CPR, defibrillation or ACLS   In the event of cardiac or respiratory ARREST Use medication by any route, position, wound care, and other measures to relive pain and suffering. May use oxygen, suction and manual treatment of airway obstruction as needed for comfort.      04/25/17 1240    Code Status History    Date Active Date Inactive Code Status Order ID Comments User Context   04/25/2017  7:20 AM 04/25/2017 12:40 PM DNR [id number]  Russella Dar, NP ED   03/10/2017  7:43 PM 03/13/2017  4:56 PM DNR [id number]  Michael Litter, MD ED   01/22/2017  3:39 PM 01/25/2017  6:13 PM DNR [id number]  Clydia Llano, MD Inpatient   01/22/2017 12:35 AM 01/22/2017  3:39 PM Full Code [id number]  Briscoe Deutscher, MD ED       Prognosis:   Unable to determine: likely weeks (or days once life-prolonging interventions are discontinued) due to severe protein calorie malnutrition (patient is eating 5-10% of meals) and overall failure to thrive from falls s/p hip repair, cdiff, and sacral pressure ulcers.     Discharge Planning:  To Be Determined: Hospice on discharge  Care plan was discussed with patient, son, friend, RN, and Dr. Izola Price  Thank you for allowing the Palliative Medicine Team to assist in the care of  this patient.   Time In: 1400 Time Out: 1520 Total Time Prolonged Time Billed  yes      Greater than 50%  of this time was spent counseling and coordinating care related to the above assessment and plan.  Vennie Homans, FNP-C Palliative Medicine Team  Phone: [phone] Fax: [phone]  Please contact Palliative Medicine Team phone at [phone] for questions and concerns.

## 2017-04-28 NOTE — Progress Notes (Signed)
Patient ID: Jamie Carlson, female   DOB: 1918-01-30, 83 y.o.   MRN: [id number]    PROGRESS NOTE  Jamie Carlson  FAO:[id number] DOB: 04-Jun-1918 DOA: 04/25/2017  PCP: No primary care provider on file.   Brief Narrative:  83 yo female with known HTN, aortic stenosis, severe PCM, underweight, had left femoral neck fracture in March this year and underwent operative placement of screws and was discharged to SNF at that time. In April, she was released from SNF and fell at home twice and readmitted to [facility] May 4th, 2018. At that time she underwent left hip hemiarthroplasty and was discharged back to SNF. Continued to decline with poor oral intake and progressive weight loss. She now presented for worsening left hip pain, fever, diarrhea, abd pain.   Please note that review of records indicated that patient had stitch abscess last week, and was started on ABX Keflex at the SNF. Dr. Jerl Santos did not think that patient had any sings of hip infection on 04/22/2017.   Assessment & Plan:   Principal Problem: SIRS due to left trochanteric bursitis, C. Diff - pt has been started on Flagyl and oral vanc - no diarrhea overnight  - per RN, pt has a good day yesterday and was able to ear - currently still on Flagyl and Oral Vanc, I suspect if no diarrhea in next 24 hours, plan to narrow down to single agent  - per ortho, no surgical intervention needed, weight bearing as tolerated   Active Problems:   Diarrhea with abd pain - from C. Diff - WBC overall trending down, plan to narrow ABX to single agent in AM     Thrombocytosis - reactive from acute illness - CBC In AM    Hypertension, essential  - reasonable inpatient control     Progressive failure to thrive, severe PCM - with severe PCM, underweight with BMI 13 - placed on appetite stimulant and pt per RN reports, ate yesterday  - encouraged oral intake     Aortic stenosis - No indication for surgical intervention    History of total left hip  arthroplasty - Per orthopedic recommendation, no plan for surgical intervention - weight bearing as tolerated     Anemia of unclear etiology - no signs of active bleeding - Hg overall stable     Hypokalemia - supplemented and WNL     Dehydration with hyponatremia - stable overall     Pressure injury of skin - Stage III pressure injury to coccyx, present on admission - Recommendation is to cleanse coccyx wound with normal saline and packed gentle dry, fill wound depth with iodoform packing strip and cover with silicone border sacral foam - Recommendation is to change packing strip daily and foam every 3 days and as needed soilage - Turn and reposition also encouraged   DVT prophylaxis: Lovenox SQ Code Status: DNR Family Communication: no family at bedside this AM  Disposition Plan: to be determined   Consultants:   Ortho   Palliative care team  Procedures:   None  Antimicrobials:   Flagyl 6/19 -->   Oral Vanc 6/19 -->  Subjective: No events overnight.   Objective: Vitals:   04/27/17 1657 04/27/17 2209 04/28/17 0611 04/28/17 1249  BP: 109/63 (!) 105/57 111/70 108/66  Pulse:  60 70 71  Resp: 18 14 14 16   Temp: 97 F (36.1 C) 97.7 F (36.5 C) 97.5 F (36.4 C) 97.8 F (36.6 C)  TempSrc: Oral Oral Oral Oral  SpO2: 100% 100% 91% 94%  Weight:      Height:        Intake/Output Summary (Last 24 hours) at 04/28/17 1415 Last data filed at 04/28/17 1403  Gross per 24 hour  Intake              180 ml  Output               10 ml  Net              170 ml   Filed Weights   04/25/17 0231 04/25/17 1235  Weight: 34 kg (75 lb) 30.1 kg (66 lb 4.8 oz)    Physical Exam  Constitutional: Appears cachectic, NAD CVS: RRR, S1/S2 +, no gallops, no carotid bruit.  Pulmonary: Effort and breath sounds normal, no stridor, rhonchi, wheezes, rales.  Abdominal: Soft. BS +,  no distension, tenderness, rebound or guarding.   Data Reviewed: I have personally reviewed following  labs and imaging studies  CBC:  Recent Labs Lab 04/25/17 0206 04/26/17 0734 04/27/17 0704 04/28/17 0619  WBC 22.4* 21.7* 17.4* 15.7*  NEUTROABS 18.8*  --   --   --   HGB 10.5* 10.1* 8.6* 9.1*  HCT 32.1* 31.9* 27.8* 28.8*  MCV 88.2 89.4 89.1 89.7  PLT 543* 506* 494* 482*   Basic Metabolic Panel:  Recent Labs Lab 04/25/17 0206 04/26/17 0734 04/27/17 0704 04/28/17 0619  NA 132* 138 140 144  K 3.2* 3.7 3.5 4.3  CL 98* 108 112* 117*  CO2 26 23 25 23   GLUCOSE 114* 158* 123* 110*  BUN 30* 18 7 9   CREATININE 0.82 0.57 0.47 0.56  CALCIUM 8.7* 8.3* 7.9* 8.5*   Liver Function Tests:  Recent Labs Lab 04/25/17 0206 04/26/17 0734  AST 21 14*  ALT 17 13*  ALKPHOS 71 72  BILITOT 0.5 0.3  PROT 5.4* 5.1*  ALBUMIN 2.2* 2.0*   Coagulation Profile:  Recent Labs Lab 04/25/17 0206  INR 1.22   Urine analysis:    Component Value Date/Time   COLORURINE YELLOW 04/25/2017 0304   APPEARANCEUR HAZY (A) 04/25/2017 0304   LABSPEC 1.020 04/25/2017 0304   PHURINE 5.0 04/25/2017 0304   GLUCOSEU NEGATIVE 04/25/2017 0304   HGBUR NEGATIVE 04/25/2017 0304   BILIRUBINUR NEGATIVE 04/25/2017 0304   KETONESUR NEGATIVE 04/25/2017 0304   PROTEINUR 30 (A) 04/25/2017 0304   UROBILINOGEN 1.0 06/22/2013 1458   NITRITE NEGATIVE 04/25/2017 0304   LEUKOCYTESUR NEGATIVE 04/25/2017 0304   Recent Results (from the past 240 hour(s))  Blood Culture (routine x 2)     Status: None (Preliminary result)   Collection Time: 04/25/17  2:15 AM  Result Value Ref Range Status   Specimen Description BLOOD BLOOD LEFT FOREARM  Final   Special Requests   Final    BOTTLES DRAWN AEROBIC AND ANAEROBIC Blood Culture adequate volume   Culture NO GROWTH 3 DAYS  Final   Report Status PENDING  Incomplete  Urine culture     Status: None   Collection Time: 04/25/17  3:04 AM  Result Value Ref Range Status   Specimen Description URINE, CATHETERIZED  Final   Special Requests NONE  Final   Culture NO GROWTH  Final    Report Status 04/26/2017 FINAL  Final  Blood Culture (routine x 2)     Status: None (Preliminary result)   Collection Time: 04/25/17  4:00 AM  Result Value Ref Range Status   Specimen Description BLOOD RIGHT ARM  Final  Special Requests IN PEDIATRIC BOTTLE Blood Culture adequate volume  Final   Culture NO GROWTH 3 DAYS  Final   Report Status PENDING  Incomplete  C difficile quick scan w PCR reflex     Status: Abnormal   Collection Time: 04/25/17  6:31 AM  Result Value Ref Range Status   C Diff antigen POSITIVE (A) NEGATIVE Final   C Diff toxin NEGATIVE NEGATIVE Final   C Diff interpretation Results are indeterminate. See PCR results.  Final  Clostridium Difficile by PCR     Status: Abnormal   Collection Time: 04/25/17  6:31 AM  Result Value Ref Range Status   Toxigenic C Difficile by pcr POSITIVE (A) NEGATIVE Final    Comment: Positive for toxigenic C. difficile with little to no toxin production. Only treat if clinical presentation suggests symptomatic illness.  Gastrointestinal Panel by PCR , Stool     Status: None   Collection Time: 04/25/17  8:39 AM  Result Value Ref Range Status   Campylobacter species NOT DETECTED NOT DETECTED Final   Plesimonas shigelloides NOT DETECTED NOT DETECTED Final   Salmonella species NOT DETECTED NOT DETECTED Final   Yersinia enterocolitica NOT DETECTED NOT DETECTED Final   Vibrio species NOT DETECTED NOT DETECTED Final   Vibrio cholerae NOT DETECTED NOT DETECTED Final   Enteroaggregative E coli (EAEC) NOT DETECTED NOT DETECTED Final   Enteropathogenic E coli (EPEC) NOT DETECTED NOT DETECTED Final   Enterotoxigenic E coli (ETEC) NOT DETECTED NOT DETECTED Final   Shiga like toxin producing E coli (STEC) NOT DETECTED NOT DETECTED Final   Shigella/Enteroinvasive E coli (EIEC) NOT DETECTED NOT DETECTED Final   Cryptosporidium NOT DETECTED NOT DETECTED Final   Cyclospora cayetanensis NOT DETECTED NOT DETECTED Final   Entamoeba histolytica NOT DETECTED  NOT DETECTED Final   Giardia lamblia NOT DETECTED NOT DETECTED Final   Adenovirus F40/41 NOT DETECTED NOT DETECTED Final   Astrovirus NOT DETECTED NOT DETECTED Final   Norovirus GI/GII NOT DETECTED NOT DETECTED Final   Rotavirus A NOT DETECTED NOT DETECTED Final   Sapovirus (I, II, IV, and V) NOT DETECTED NOT DETECTED Final  MRSA PCR Screening     Status: Abnormal   Collection Time: 04/25/17  8:54 PM  Result Value Ref Range Status   MRSA by PCR POSITIVE (A) NEGATIVE Final    Comment:        The GeneXpert MRSA Assay (FDA approved for NASAL specimens only), is one component of a comprehensive MRSA colonization surveillance program. It is not intended to diagnose MRSA infection nor to guide or monitor treatment for MRSA infections. RESULT CALLED TO, READ BACK BY AND VERIFIED WITH: S.TULLIO,RN AT 2359 BY L.PITT 04/25/17      Radiology Studies: No results found.  Scheduled Meds: . acetaminophen  650 mg Oral Q6H  . aspirin  325 mg Oral Daily  . Chlorhexidine Gluconate Cloth  6 each Topical Q0600  . dronabinol  2.5 mg Oral BID AC  . enoxaparin (LOVENOX) injection  20 mg Subcutaneous Q24H  . feeding supplement (ENSURE ENLIVE)  237 mL Oral BID BM  . feeding supplement (PRO-STAT SUGAR FREE 64)  30 mL Oral BID  . mirtazapine  7.5 mg Oral QHS  . multivitamin with minerals  1 tablet Oral Daily  . mupirocin ointment   Nasal BID  . vancomycin  125 mg Oral Q6H   Continuous Infusions: . dextrose 5 % and 0.9 % NaCl with KCl 20 mEq/L 75 mL/hr at 04/28/17 0800  .  metronidazole Stopped (04/28/17 0900)     LOS: 3 days   Time spent: 25 minutes   Debbora Presto, MD [facility] Pager [phone]  If 7PM-7AM, please contact night-coverage [email or web address] Password [email or web address] 04/28/2017, 2:15 PM

## 2017-04-29 DIAGNOSIS — R627 Adult failure to thrive: Secondary | ICD-10-CM

## 2017-04-29 LAB — BASIC METABOLIC PANEL
ANION GAP: 5 (ref 5–15)
BUN: 11 mg/dL (ref 6–20)
CO2: 20 mmol/L — ABNORMAL LOW (ref 22–32)
Calcium: 8.1 mg/dL — ABNORMAL LOW (ref 8.9–10.3)
Chloride: 113 mmol/L — ABNORMAL HIGH (ref 101–111)
Creatinine, Ser: 0.55 mg/dL (ref 0.44–1.00)
GLUCOSE: 136 mg/dL — AB (ref 65–99)
POTASSIUM: 4.3 mmol/L (ref 3.5–5.1)
Sodium: 138 mmol/L (ref 135–145)

## 2017-04-29 LAB — CBC
HCT: 32.8 % — ABNORMAL LOW (ref 36.0–46.0)
Hemoglobin: 10.1 g/dL — ABNORMAL LOW (ref 12.0–15.0)
MCH: 27.8 pg (ref 26.0–34.0)
MCHC: 30.8 g/dL (ref 30.0–36.0)
MCV: 90.4 fL (ref 78.0–100.0)
PLATELETS: 480 10*3/uL — AB (ref 150–400)
RBC: 3.63 MIL/uL — AB (ref 3.87–5.11)
RDW: 13.8 % (ref 11.5–15.5)
WBC: 15.6 10*3/uL — ABNORMAL HIGH (ref 4.0–10.5)

## 2017-04-29 NOTE — Progress Notes (Signed)
Pt alert and confused at times, even the family member at the bedside the pt refused to take the meds, refused to eat nor drink she did not eat breakfast, lunch and dinner, wasted my meds with witness, will continue to monitor.

## 2017-04-29 NOTE — Progress Notes (Signed)
Patient ID: Jamie Carlson, female   DOB: 05-19-1918, 83 y.o.   MRN: [id number]    PROGRESS NOTE  Jamie Carlson  NWG:[id number] DOB: 07-15-1918 DOA: 04/25/2017  PCP: Shirlean Mylar, MD   Brief Narrative:  83 yo female with known HTN, aortic stenosis, severe PCM, underweight, had left femoral neck fracture in March this year and underwent operative placement of screws and was discharged to SNF at that time. In April, she was released from SNF and fell at home twice and readmitted to [facility] May 4th, 2018. At that time she underwent left hip hemiarthroplasty and was discharged back to SNF. Continued to decline with poor oral intake and progressive weight loss. She now presented for worsening left hip pain, fever, diarrhea, abd pain.   Please note that review of records indicated that patient had stitch abscess last week, and was started on ABX Keflex at the SNF. Dr. Jerl Santos did not think that patient had any sings of hip infection on 04/22/2017.   Assessment & Plan:   Principal Problem: SIRS due to left trochanteric bursitis, C. Diff - pt has been started on Flagyl and oral vanc - no more diarrhea - plan to stop Flagyl but continue oral Vanc - pt still with poor oral intake but per family appears to be somewhat better   Active Problems:   Diarrhea with abd pain - from C. Diff - WBC trending down, 15 K this AM - stop Flagyl and continue only oral Vanc     Thrombocytosis - reactive     Hypertension, essential  - BP soft - keep on IVF    Progressive failure to thrive, severe PCM - with severe PCM, underweight with BMI 13 - placed on appetite stimulant but per family not much better - able to eat little more but still not what family expected  - family is agree[facility]     Aortic stenosis - No indication for surgical intervention    History of total left hip arthroplasty - Per orthopedic recommendation, no plan for surgical intervention -  weight bearing as tolerated  - has not been up much, wants to be left alone     Anemia of unclear etiology - no signs of active bleeding    Hypokalemia - supplemented and WNL    Dehydration with hyponatremia - stable overall     Pressure injury of skin - Stage III pressure injury to coccyx, present on admission - Recommendation is to cleanse coccyx wound with normal saline and packed gentle dry, fill wound depth with iodoform packing strip and cover with silicone border sacral foam - Recommendation is to change packing strip daily and foam every 3 days and as needed soilage - Turn and reposition also encouraged   DVT prophylaxis: Lovenox SQ Code Status: DNR Family Communication: family at bedside this AM  Disposition Plan: to be determined   Consultants:   Ortho   Palliative care team  Procedures:   None  Antimicrobials:   Flagyl 6/19 --> 6/23  Oral Vanc 6/19 -->  Subjective: Pt more alert this am, wants to be left alone.  Objective: Vitals:   04/28/17 0611 04/28/17 1249 04/28/17 2009 04/29/17 0406  BP: 111/70 108/66 (!) 100/59 (!) 98/49  Pulse: 70 71 81 86  Resp: 14 16 17 19   Temp: 97.5 F (36.4 C) 97.8 F (36.6 C) 98 F (36.7 C) 98 F (36.7 C)  TempSrc: Oral Oral Oral Oral  SpO2: 91% 94%  94% 94%  Weight:      Height:        Intake/Output Summary (Last 24 hours) at 04/29/17 1425 Last data filed at 04/29/17 0900  Gross per 24 hour  Intake             1341 ml  Output                0 ml  Net             1341 ml   Filed Weights   04/25/17 0231 04/25/17 1235  Weight: 34 kg (75 lb) 30.1 kg (66 lb 4.8 oz)    No exam this am per pt's request   Data Reviewed: I have personally reviewed following labs and imaging studies  CBC:  Recent Labs Lab 04/25/17 0206 04/26/17 0734 04/27/17 0704 04/28/17 0619 04/29/17 0523  WBC 22.4* 21.7* 17.4* 15.7* 15.6*  NEUTROABS 18.8*  --   --   --   --   HGB 10.5* 10.1* 8.6* 9.1* 10.1*  HCT 32.1* 31.9* 27.8*  28.8* 32.8*  MCV 88.2 89.4 89.1 89.7 90.4  PLT 543* 506* 494* 482* 480*   Basic Metabolic Panel:  Recent Labs Lab 04/25/17 0206 04/26/17 0734 04/27/17 0704 04/28/17 0619 04/29/17 0523  NA 132* 138 140 144 138  K 3.2* 3.7 3.5 4.3 4.3  CL 98* 108 112* 117* 113*  CO2 26 23 25 23  20*  GLUCOSE 114* 158* 123* 110* 136*  BUN 30* 18 7 9 11   CREATININE 0.82 0.57 0.47 0.56 0.55  CALCIUM 8.7* 8.3* 7.9* 8.5* 8.1*   Liver Function Tests:  Recent Labs Lab 04/25/17 0206 04/26/17 0734  AST 21 14*  ALT 17 13*  ALKPHOS 71 72  BILITOT 0.5 0.3  PROT 5.4* 5.1*  ALBUMIN 2.2* 2.0*   Coagulation Profile:  Recent Labs Lab 04/25/17 0206  INR 1.22   Urine analysis:    Component Value Date/Time   COLORURINE YELLOW 04/25/2017 0304   APPEARANCEUR HAZY (A) 04/25/2017 0304   LABSPEC 1.020 04/25/2017 0304   PHURINE 5.0 04/25/2017 0304   GLUCOSEU NEGATIVE 04/25/2017 0304   HGBUR NEGATIVE 04/25/2017 0304   BILIRUBINUR NEGATIVE 04/25/2017 0304   KETONESUR NEGATIVE 04/25/2017 0304   PROTEINUR 30 (A) 04/25/2017 0304   UROBILINOGEN 1.0 06/22/2013 1458   NITRITE NEGATIVE 04/25/2017 0304   LEUKOCYTESUR NEGATIVE 04/25/2017 0304   Recent Results (from the past 240 hour(s))  Blood Culture (routine x 2)     Status: None (Preliminary result)   Collection Time: 04/25/17  2:15 AM  Result Value Ref Range Status   Specimen Description BLOOD BLOOD LEFT FOREARM  Final   Special Requests   Final    BOTTLES DRAWN AEROBIC AND ANAEROBIC Blood Culture adequate volume   Culture NO GROWTH 4 DAYS  Final   Report Status PENDING  Incomplete  Urine culture     Status: None   Collection Time: 04/25/17  3:04 AM  Result Value Ref Range Status   Specimen Description URINE, CATHETERIZED  Final   Special Requests NONE  Final   Culture NO GROWTH  Final   Report Status 04/26/2017 FINAL  Final  Blood Culture (routine x 2)     Status: None (Preliminary result)   Collection Time: 04/25/17  4:00 AM  Result Value  Ref Range Status   Specimen Description BLOOD RIGHT ARM  Final   Special Requests IN PEDIATRIC BOTTLE Blood Culture adequate volume  Final   Culture NO GROWTH 4  DAYS  Final   Report Status PENDING  Incomplete  C difficile quick scan w PCR reflex     Status: Abnormal   Collection Time: 04/25/17  6:31 AM  Result Value Ref Range Status   C Diff antigen POSITIVE (A) NEGATIVE Final   C Diff toxin NEGATIVE NEGATIVE Final   C Diff interpretation Results are indeterminate. See PCR results.  Final  Clostridium Difficile by PCR     Status: Abnormal   Collection Time: 04/25/17  6:31 AM  Result Value Ref Range Status   Toxigenic C Difficile by pcr POSITIVE (A) NEGATIVE Final    Comment: Positive for toxigenic C. difficile with little to no toxin production. Only treat if clinical presentation suggests symptomatic illness.  Gastrointestinal Panel by PCR , Stool     Status: None   Collection Time: 04/25/17  8:39 AM  Result Value Ref Range Status   Campylobacter species NOT DETECTED NOT DETECTED Final   Plesimonas shigelloides NOT DETECTED NOT DETECTED Final   Salmonella species NOT DETECTED NOT DETECTED Final   Yersinia enterocolitica NOT DETECTED NOT DETECTED Final   Vibrio species NOT DETECTED NOT DETECTED Final   Vibrio cholerae NOT DETECTED NOT DETECTED Final   Enteroaggregative E coli (EAEC) NOT DETECTED NOT DETECTED Final   Enteropathogenic E coli (EPEC) NOT DETECTED NOT DETECTED Final   Enterotoxigenic E coli (ETEC) NOT DETECTED NOT DETECTED Final   Shiga like toxin producing E coli (STEC) NOT DETECTED NOT DETECTED Final   Shigella/Enteroinvasive E coli (EIEC) NOT DETECTED NOT DETECTED Final   Cryptosporidium NOT DETECTED NOT DETECTED Final   Cyclospora cayetanensis NOT DETECTED NOT DETECTED Final   Entamoeba histolytica NOT DETECTED NOT DETECTED Final   Giardia lamblia NOT DETECTED NOT DETECTED Final   Adenovirus F40/41 NOT DETECTED NOT DETECTED Final   Astrovirus NOT DETECTED NOT  DETECTED Final   Norovirus GI/GII NOT DETECTED NOT DETECTED Final   Rotavirus A NOT DETECTED NOT DETECTED Final   Sapovirus (I, II, IV, and V) NOT DETECTED NOT DETECTED Final  MRSA PCR Screening     Status: Abnormal   Collection Time: 04/25/17  8:54 PM  Result Value Ref Range Status   MRSA by PCR POSITIVE (A) NEGATIVE Final    Comment:        The GeneXpert MRSA Assay (FDA approved for NASAL specimens only), is one component of a comprehensive MRSA colonization surveillance program. It is not intended to diagnose MRSA infection nor to guide or monitor treatment for MRSA infections. RESULT CALLED TO, READ BACK BY AND VERIFIED WITH: S.TULLIO,RN AT 2359 BY L.PITT 04/25/17      Radiology Studies: No results found.  Scheduled Meds: . acetaminophen  650 mg Oral Q6H  . aspirin  325 mg Oral Daily  . Chlorhexidine Gluconate Cloth  6 each Topical Q0600  . dronabinol  2.5 mg Oral BID AC  . enoxaparin (LOVENOX) injection  20 mg Subcutaneous Q24H  . feeding supplement (ENSURE ENLIVE)  237 mL Oral BID BM  . feeding supplement (PRO-STAT SUGAR FREE 64)  30 mL Oral BID  . mirtazapine  7.5 mg Oral QHS  . multivitamin with minerals  1 tablet Oral Daily  . mupirocin ointment   Nasal BID  . vancomycin  125 mg Oral Q6H   Continuous Infusions: . dextrose 5 % and 0.9 % NaCl with KCl 20 mEq/L 75 mL/hr at 04/28/17 2340     LOS: 4 days   Time spent: 25 minutes   Walt Disneyskra Magick-Shervin Cypert,  MD [facility] Pager [phone]  If 7PM-7AM, please contact night-coverage [email or web address] Password [email or web address] 04/29/2017, 2:25 PM

## 2017-04-29 NOTE — Clinical Social Work Note (Signed)
Clinical Social Work Assessment  Patient Details  Name: Jamie Carlson MRN: [id number] Date of Birth: [birth year]  Date of referral:  04/26/17               Reason for consult:  Discharge Planning                Permission sought to share information with:  Family Supports Permission granted to share information::     Name::     [place]::  [place]  Relationship::  Daughter (Adoptive)  Contact Information:  [phone]  Housing/Transportation Living arrangements for the past 2 months:  [place] of Information:  Adult Children Patient Interpreter Needed:  None Criminal Activity/Legal Involvement Pertinent to Current Situation/Hospitalization:  No - Comment as needed Significant Relationships:  Adult Children Lives with:  Facility Resident Do you feel safe going back to the place where you live?  Yes Need for family participation in patient care:  Yes (Comment)  Care giving concerns:  No care giving concerns identified.    Social Worker assessment / plan:  CSW consulted for pt from [employer]. CSW met with adoptive dtr-Jamie Carlson at bedside. Pt sleeping soundly. CSW introduced self and explained CSW role. Dtr states pt plans to return to [place] when medically ready.   CSW continuing to follow for d/c needs when pt medically stable.   Employment status:  Retired Forensic scientist:  Medicare PT Recommendations:  [place] / Referral to community resources:  [place]  Patient/Family's Response to care:  Unable to assess-pt asleep. Dtr- appreciative of CSW support.   Patient/Family's Understanding of and Emotional Response to Diagnosis, Current Treatment, and Prognosis:  Unable to assess. Pt sleeping. Dtr aware of pt's current medical state and is hopeful pt's medical condition will improve.   Emotional Assessment Appearance:  Appears stated age Attitude/Demeanor/Rapport:  Unable to Assess (Pt  sleeping) Affect (typically observed):  Unable to Assess (Pt sleeping) Orientation:  Oriented to Self, Oriented to Place, Oriented to Situation Alcohol / Substance use:  Other Psych involvement (Current and /or in the community):  No (Comment)  Discharge Needs  Concerns to be addressed:  Care Coordination Readmission within the last 30 days:  No Current discharge risk:  Dependent with Mobility Barriers to Discharge:  Continued Medical Work up   Truitt Merle, LCSW 04/29/2017, 3:44 PM

## 2017-04-30 DIAGNOSIS — Z515 Encounter for palliative care: Secondary | ICD-10-CM

## 2017-04-30 LAB — CBC
HEMATOCRIT: 33.7 % — AB (ref 36.0–46.0)
HEMOGLOBIN: 10.5 g/dL — AB (ref 12.0–15.0)
MCH: 27.3 pg (ref 26.0–34.0)
MCHC: 31.2 g/dL (ref 30.0–36.0)
MCV: 87.8 fL (ref 78.0–100.0)
Platelets: 459 10*3/uL — ABNORMAL HIGH (ref 150–400)
RBC: 3.84 MIL/uL — ABNORMAL LOW (ref 3.87–5.11)
RDW: 13.9 % (ref 11.5–15.5)
WBC: 17.3 10*3/uL — ABNORMAL HIGH (ref 4.0–10.5)

## 2017-04-30 LAB — CULTURE, BLOOD (ROUTINE X 2)
Culture: NO GROWTH
Culture: NO GROWTH
Special Requests: ADEQUATE
Special Requests: ADEQUATE

## 2017-04-30 LAB — BASIC METABOLIC PANEL
Anion gap: 5 (ref 5–15)
BUN: 7 mg/dL (ref 6–20)
CO2: 22 mmol/L (ref 22–32)
CREATININE: 0.49 mg/dL (ref 0.44–1.00)
Calcium: 8.2 mg/dL — ABNORMAL LOW (ref 8.9–10.3)
Chloride: 113 mmol/L — ABNORMAL HIGH (ref 101–111)
GFR calc Af Amer: >60 mL/min (ref >60)
GFR calc non Af Amer: >60 mL/min (ref >60)
GLUCOSE: 148 mg/dL — AB (ref 65–99)
Potassium: 4 mmol/L (ref 3.5–5.1)
Sodium: 140 mmol/L (ref 135–145)

## 2017-04-30 MED ORDER — MORPHINE SULFATE 10 MG/5ML PO SOLN: 2.5000 mg | ORAL | Status: DC | PRN

## 2017-04-30 NOTE — [place] FL2 (Signed)
[place] MEDICAID FL2 LEVEL OF CARE SCREENING TOOL     IDENTIFICATION  Patient Name: Jamie Carlson Birthdate: 24-Jul-1918 Sex: female Admission Date (Current Location): 04/25/2017  [facility] and [place] Number:  Producer, television/film/video and Address:  [facility] [place], [address], [place], [place] [address]      Provider Number: [id number]  Attending Physician Name and Address:  Dorothea Ogle, MD  Relative Name and Phone Number:  Karalyne Nusser, [phone]    Current Level of Care: Hospital Recommended Level of Care: Skilled Nursing Facility Prior Approval Number:    Date Approved/Denied:   PASRR Number: [id number]  Discharge Plan: SNF    Current Diagnoses: Patient Active Problem List   Diagnosis Date Noted  . Adult failure to thrive   . Goals of care, counseling/discussion   . Palliative care encounter   . Hypertension 04/25/2017  . Aortic stenosis 04/25/2017  . Pulmonary HTN (HCC) 04/25/2017  . History of total left hip arthroplasty 04/25/2017  . Protein calorie malnutrition (HCC) 04/25/2017  . SIRS (systemic inflammatory response syndrome) (HCC) 04/25/2017  . Diarrhea 04/25/2017  . Dehydration with hyponatremia 04/25/2017  . Pressure injury of skin 04/25/2017  . Greater trochanteric bursitis of left hip 04/25/2017  . Colitis, Clostridium difficile   . Hip fracture (HCC) 03/10/2017  . Leukocytosis 03/10/2017  . Constipation 01/26/2017  . Protein-calorie malnutrition, severe 01/24/2017  . Essential hypertension 01/22/2017  . Hypertensive urgency 01/22/2017  . Hypokalemia 01/22/2017  . Closed subcapital fracture of femur, left, initial encounter (HCC) 01/22/2017  . Cardiac murmur 01/22/2017  . Cystitis   . Fall     Orientation RESPIRATION BLADDER Height & Weight     Self, Situation, Place  Normal Incontinent Weight: 66 lb 4.8 oz (30.1 kg) Height:  4\' 11"  (149.9 cm)  BEHAVIORAL SYMPTOMS/MOOD NEUROLOGICAL BOWEL NUTRITION STATUS   Incontinent Diet (SOFT)  AMBULATORY STATUS COMMUNICATION OF NEEDS Skin   Limited Assist Verbally Normal                       Personal Care Assistance Level of Assistance  Bathing, Feeding, Dressing Bathing Assistance: Limited assistance Feeding assistance: Limited assistance Dressing Assistance: Limited assistance     Functional Limitations Info  Sight, Hearing, Speech Sight Info: Adequate Hearing Info: Adequate Speech Info: Adequate    SPECIAL CARE FACTORS FREQUENCY  PT (By licensed PT), OT (By licensed OT)     PT Frequency: 5x wk OT Frequency: 5x wk            Contractures Contractures Info: Not present    Additional Factors Info  Code Status, Allergies Code Status Info: DNR Allergies Info: SULFA ANTIBIOTICS, BEE VENOM           Current Medications (04/30/2017):  This is the current hospital active medication list Current Facility-Administered Medications  Medication Dose Route Frequency Provider Last Rate Last Dose  . acetaminophen (TYLENOL) tablet 650 mg  650 mg Oral Q6H Russella Dar, NP   650 mg at 04/29/17 1900  . aspirin tablet 325 mg  325 mg Oral Daily Russella Dar, NP   325 mg at 04/29/17 0954  . Chlorhexidine Gluconate Cloth 2 % PADS 6 each  6 each Topical Q0600 Dorothea Ogle, MD   6 each at 04/30/17 [id number]  . dextrose 5 % and 0.9 % NaCl with KCl 20 mEq/L infusion   Intravenous Continuous Dorothea Ogle, MD 75 mL/hr at 04/30/17 0356    .  dronabinol (MARINOL) capsule 2.5 mg  2.5 mg Oral BID AC Dorothea OgleMyers, Iskra M, MD   2.5 mg at 04/29/17 1257  . enoxaparin (LOVENOX) injection 20 mg  20 mg Subcutaneous Q24H Russella DarEllis, Allison L, NP   20 mg at 04/28/17 1652  . feeding supplement (ENSURE ENLIVE) (ENSURE ENLIVE) liquid 237 mL  237 mL Oral BID BM Dorothea OgleMyers, Iskra M, MD   237 mL at 04/29/17 1309  . feeding supplement (PRO-STAT SUGAR FREE 64) liquid 30 mL  30 mL Oral BID Dorothea OgleMyers, Iskra M, MD   30 mL at 04/29/17 0954  . LORazepam (ATIVAN) tablet 0.5 mg  0.5 mg Oral  BID PRN Russella DarEllis, Allison L, NP   0.5 mg at 04/28/17 0033  . mirtazapine (REMERON) tablet 7.5 mg  7.5 mg Oral QHS Russella DarEllis, Allison L, NP   7.5 mg at 04/28/17 2120  . multivitamin with minerals tablet 1 tablet  1 tablet Oral Daily Dorothea OgleMyers, Iskra M, MD   1 tablet at 04/29/17 [id number]  . mupirocin ointment (BACTROBAN) 2 %   Nasal BID Dorothea OgleMyers, Iskra M, MD      . ondansetron [facility](ZOFRAN) tablet 4 mg  4 mg Oral Q6H PRN Russella DarEllis, Allison L, NP       Or  . ondansetron [facility](ZOFRAN) injection 4 mg  4 mg Intravenous Q6H PRN Russella DarEllis, Allison L, NP      . traMADol Janean Sark(ULTRAM) tablet 50 mg  50 mg Oral Q6H PRN Dorothea OgleMyers, Iskra M, MD      . vancomycin (VANCOCIN) 50 mg/mL oral solution 125 mg  125 mg Oral Q6H Russella DarEllis, Allison L, NP   125 mg at 04/30/17 [id number]     Discharge Medications: Please see discharge summary for a list of discharge medications.  Relevant Imaging Results:  Relevant Lab Results:   Additional Information SS#[id number]  Althea CharonAshley C Chandni Gagan, LCSW

## 2017-04-30 NOTE — Progress Notes (Addendum)
Patient refusing some of her medications, and has very poor appetite.

## 2017-04-30 NOTE — Progress Notes (Signed)
Patient ID: Jamie Carlson, female   DOB: February 28, 1918, 83 y.o.   MRN: [id number]    PROGRESS NOTE  Jamie Reilnne S Ferrebee  WUJ:[id number] DOB: February 28, 1918 DOA: 04/25/2017  PCP: Shirlean MylarWebb, Carol, MD   Brief Narrative:  83 yo female with known HTN, aortic stenosis, severe PCM, underweight, had left femoral neck fracture in March this year and underwent operative placement of screws and was discharged to SNF at that time. In April, she was released from SNF and fell at home twice and readmitted to [facility] May [facility]th, [facility]. At that time she underwent left hip hemiarthroplasty and was discharged back to SNF. Continued to decline with poor oral intake and progressive weight loss. She now presented for worsening left hip pain, fever, diarrhea, abd pain.   Please note that review of records indicated that patient had stitch abscess last week, and was started on ABX Keflex at the SNF. Dr. Jerl Santosalldorf did not think that patient had any sings of hip infection on 04/22/2017.   Assessment & Plan:   Principal Problem: SIRS due to left trochanteric bursitis, C. Diff - pt has been started on Flagyl and oral vanc - no more diarrhea but WBC up again this AM  - slow clinical progression - flagyl stopped, only on oral vanc for now day #6/14  Active Problems:   Diarrhea with abd pain - from C. Diff - WBC up again this am, unclear why, no further escalation of care per family request - oral vanc to be continued     Thrombocytosis - reactive  - improving    Hypertension, essential  - BP soft, likely from poor oral intake - monitor - supportive care     Progressive failure to thrive, severe PCM - with severe PCM, underweight with BMI 13 - placed on appetite stimulant but per family not much better - ? If candidate for residential hospice     Aortic stenosis - No indication for surgical intervention    History of total left hip arthroplasty - Per orthopedic recommendation, no plan for surgical intervention - weight bearing  as tolerated  - has not been up much     Anemia of unclear etiology - no signs of active bleeding    Hypokalemia - WNL this AM     Dehydration with hyponatremia - Na has remained stable     Pressure injury of skin - Stage III pressure injury to coccyx, present on admission - Recommendation is to cleanse coccyx wound with normal saline and packed gentle dry, fill wound depth with iodoform packing strip and cover with silicone border sacral foam - Recommendation is to change packing strip daily and foam every 3 days and as needed soilage - Turn and reposition also encouraged   DVT prophylaxis: Lovenox SQ Code Status: DNR Family Communication: no family at bedside, they have my contact number to reach me at their convenience  Disposition Plan: to be determined   Consultants:   Ortho   Palliative care team  Procedures:   None  Antimicrobials:   Flagyl 6/19 --> 6/23  Oral Vanc 6/19 -->  Subjective: Pt sleepy, wants to be left alone.   Objective: Vitals:   04/28/17 2009 04/29/17 0406 04/29/17 1546 04/30/17 0700  BP: (!) 100/59 (!) 98/49 107/65 126/62  Pulse: 81 86 85 89  Resp: 17 19 19 16   Temp:  98 F (36.7 C) 98 F (36.7 C) 98.2 F (36.8 C)  TempSrc: Oral Oral Oral Oral  SpO2: 94% 94% 94%  93%  Weight:      Height:        Intake/Output Summary (Last 24 hours) at 04/30/17 1450 Last data filed at 04/29/17 2300  Gross per 24 hour  Intake             1395 ml  Output                0 ml  Net             1395 ml   Filed Weights   04/25/17 0231 04/25/17 1235  Weight: 34 kg (75 lb) 30.1 kg (66 lb 4.8 oz)    Pt declined physical exam  Data Reviewed: I have personally reviewed following labs and imaging studies  CBC:  Recent Labs Lab 04/25/17 0206 04/26/17 0734 04/27/17 0704 04/28/17 0619 04/29/17 0523 04/30/17 0647  WBC 22.4* 21.7* 17.4* 15.7* 15.6* 17.3*  NEUTROABS 18.8*  --   --   --   --   --   HGB 10.5* 10.1* 8.6* 9.1* 10.1* 10.5*  HCT  32.1* 31.9* 27.8* 28.8* 32.8* 33.7*  MCV 88.2 89.4 89.1 89.7 90.4 87.8  PLT 543* 506* 494* 482* 480* 459*   Basic Metabolic Panel:  Recent Labs Lab 04/26/17 0734 04/27/17 0704 04/28/17 0619 04/29/17 0523 04/30/17 0647  NA 138 140 144 138 140  K 3.7 3.5 4.3 4.3 4.0  CL 108 112* 117* 113* 113*  CO2 23 25 23  20* 22  GLUCOSE 158* 123* 110* 136* 148*  BUN 18 7 9 11 7   CREATININE 0.57 0.47 0.56 0.55 0.49  CALCIUM 8.3* 7.9* 8.5* 8.1* 8.2*   Liver Function Tests:  Recent Labs Lab 04/25/17 0206 04/26/17 0734  AST 21 14*  ALT 17 13*  ALKPHOS 71 72  BILITOT 0.5 0.3  PROT 5.4* 5.1*  ALBUMIN 2.2* 2.0*   Coagulation Profile:  Recent Labs Lab 04/25/17 0206  INR 1.22   Urine analysis:    Component Value Date/Time   COLORURINE YELLOW 04/25/2017 0304   APPEARANCEUR HAZY (A) 04/25/2017 0304   LABSPEC 1.020 04/25/2017 0304   PHURINE 5.0 04/25/2017 0304   GLUCOSEU NEGATIVE 04/25/2017 0304   HGBUR NEGATIVE 04/25/2017 0304   BILIRUBINUR NEGATIVE 04/25/2017 0304   KETONESUR NEGATIVE 04/25/2017 0304   PROTEINUR 30 (A) 04/25/2017 0304   UROBILINOGEN 1.0 06/22/2013 1458   NITRITE NEGATIVE 04/25/2017 0304   LEUKOCYTESUR NEGATIVE 04/25/2017 0304   Recent Results (from the past 240 hour(s))  Blood Culture (routine x 2)     Status: None   Collection Time: 04/25/17  2:15 AM  Result Value Ref Range Status   Specimen Description BLOOD BLOOD LEFT FOREARM  Final   Special Requests   Final    BOTTLES DRAWN AEROBIC AND ANAEROBIC Blood Culture adequate volume   Culture NO GROWTH 5 DAYS  Final   Report Status 04/30/2017 FINAL  Final  Urine culture     Status: None   Collection Time: 04/25/17  3:04 AM  Result Value Ref Range Status   Specimen Description URINE, CATHETERIZED  Final   Special Requests NONE  Final   Culture NO GROWTH  Final   Report Status 04/26/2017 FINAL  Final  Blood Culture (routine x 2)     Status: None   Collection Time: 04/25/17  4:00 AM  Result Value Ref Range  Status   Specimen Description BLOOD RIGHT ARM  Final   Special Requests IN PEDIATRIC BOTTLE Blood Culture adequate volume  Final   Culture NO  GROWTH 5 DAYS  Final   Report Status 04/30/2017 FINAL  Final  C difficile quick scan w PCR reflex     Status: Abnormal   Collection Time: 04/25/17  6:31 AM  Result Value Ref Range Status   C Diff antigen POSITIVE (A) NEGATIVE Final   C Diff toxin NEGATIVE NEGATIVE Final   C Diff interpretation Results are indeterminate. See PCR results.  Final  Clostridium Difficile by PCR     Status: Abnormal   Collection Time: 04/25/17  6:31 AM  Result Value Ref Range Status   Toxigenic C Difficile by pcr POSITIVE (A) NEGATIVE Final    Comment: Positive for toxigenic C. difficile with little to no toxin production. Only treat if clinical presentation suggests symptomatic illness.  Gastrointestinal Panel by PCR , Stool     Status: None   Collection Time: 04/25/17  8:39 AM  Result Value Ref Range Status   Campylobacter species NOT DETECTED NOT DETECTED Final   Plesimonas shigelloides NOT DETECTED NOT DETECTED Final   Salmonella species NOT DETECTED NOT DETECTED Final   Yersinia enterocolitica NOT DETECTED NOT DETECTED Final   Vibrio species NOT DETECTED NOT DETECTED Final   Vibrio cholerae NOT DETECTED NOT DETECTED Final   Enteroaggregative E coli (EAEC) NOT DETECTED NOT DETECTED Final   Enteropathogenic E coli (EPEC) NOT DETECTED NOT DETECTED Final   Enterotoxigenic E coli (ETEC) NOT DETECTED NOT DETECTED Final   Shiga like toxin producing E coli (STEC) NOT DETECTED NOT DETECTED Final   Shigella/Enteroinvasive E coli (EIEC) NOT DETECTED NOT DETECTED Final   Cryptosporidium NOT DETECTED NOT DETECTED Final   Cyclospora cayetanensis NOT DETECTED NOT DETECTED Final   Entamoeba histolytica NOT DETECTED NOT DETECTED Final   Giardia lamblia NOT DETECTED NOT DETECTED Final   Adenovirus F40/41 NOT DETECTED NOT DETECTED Final   Astrovirus NOT DETECTED NOT DETECTED  Final   Norovirus GI/GII NOT DETECTED NOT DETECTED Final   Rotavirus A NOT DETECTED NOT DETECTED Final   Sapovirus (I, II, IV, and V) NOT DETECTED NOT DETECTED Final  MRSA PCR Screening     Status: Abnormal   Collection Time: 04/25/17  8:54 PM  Result Value Ref Range Status   MRSA by PCR POSITIVE (A) NEGATIVE Final    Comment:        The GeneXpert MRSA Assay (FDA approved for NASAL specimens only), is one component of a comprehensive MRSA colonization surveillance program. It is not intended to diagnose MRSA infection nor to guide or monitor treatment for MRSA infections. RESULT CALLED TO, READ BACK BY AND VERIFIED WITH: S.TULLIO,RN AT 2359 BY L.PITT 04/25/17      Radiology Studies: No results found.  Scheduled Meds: . acetaminophen  650 mg Oral Q6H  . aspirin  325 mg Oral Daily  . Chlorhexidine Gluconate Cloth  6 each Topical Q0600  . dronabinol  2.5 mg Oral BID AC  . enoxaparin (LOVENOX) injection  20 mg Subcutaneous Q24H  . feeding supplement (ENSURE ENLIVE)  237 mL Oral BID BM  . feeding supplement (PRO-STAT SUGAR FREE 64)  30 mL Oral BID  . mirtazapine  7.5 mg Oral QHS  . multivitamin with minerals  1 tablet Oral Daily  . mupirocin ointment   Nasal BID  . vancomycin  125 mg Oral Q6H   Continuous Infusions: . dextrose 5 % and 0.9 % NaCl with KCl 20 mEq/L 75 mL/hr at 04/30/17 0356     LOS: 5 days   Time spent: 15 minutes  Debbora Presto, MD [facility] Pager [phone]  If 7PM-7AM, please contact night-coverage [email or web address] Password [email or web address] 04/30/2017, 2:50 PM

## 2017-04-30 NOTE — Progress Notes (Addendum)
Daily Progress Note   Patient Name: Jamie Carlson       Date: 04/30/2017 DOB: [birth year]  Age: 83 y.o. MRN#: [id number] Attending Physician: Dorothea Ogle, MD Primary Care Physician: Shirlean Mylar, MD Admit Date: 04/25/2017  Reason for Consultation/Follow-up: Establishing goals of care, Hospice Evaluation and Psychosocial/spiritual support  Subjective: Per chart review patient refused all medications yesterday as well as any oral intake. This morning she is anxious and agitated when awake. She is requesting water when she is awake but otherwise is refusing any oral intake. , [name], at the bedside  Length of Stay: 5  Current Medications: Scheduled Meds:  . acetaminophen  650 mg Oral Q6H  . aspirin  325 mg Oral Daily  . Chlorhexidine Gluconate Cloth  6 each Topical Q0600  . dronabinol  2.5 mg Oral BID AC  . enoxaparin (LOVENOX) injection  20 mg Subcutaneous Q24H  . feeding supplement (ENSURE ENLIVE)  237 mL Oral BID BM  . feeding supplement (PRO-STAT SUGAR FREE 64)  30 mL Oral BID  . mirtazapine  7.5 mg Oral QHS  . multivitamin with minerals  1 tablet Oral Daily  . mupirocin ointment   Nasal BID  . vancomycin  125 mg Oral Q6H    Continuous Infusions: . dextrose 5 % and 0.9 % NaCl with KCl 20 mEq/L 75 mL/hr at 04/30/17 0356    PRN Meds: LORazepam, ondansetron **OR** ondansetron (ZOFRAN) IV, traMADol  Physical Exam  Constitutional:  Acutely ill appearing elderly female; when awake and anxious, agitated  HENT:  Head: Normocephalic and atraumatic.  Temporal wasting  Cardiovascular:  Irregular  Pulmonary/Chest: Effort normal.  Neurological:  Fluctuating level of consciousness. When she is awake she appears uncomfortable, anxious  Skin: Skin is warm and dry.  Psychiatric:    Anxious, frightened  Nursing note and vitals reviewed.           Vital Signs: BP 126/62 (BP Location: Right Arm)   Pulse 89   Temp 98.2 F (36.8 C) (Oral)   Resp 16   Ht 4\' 11"  (1.499 m)   Wt 30.1 kg (66 lb 4.8 oz)   SpO2 93%   BMI 13.39 kg/m  SpO2: SpO2: 93 % O2 Device: O2 Device: Not Delivered O2 Flow Rate:    Intake/output summary:  Intake/Output Summary (Last 24  hours) at 04/30/17 1218 Last data filed at 04/29/17 2300  Gross per 24 hour  Intake             1425 ml  Output                0 ml  Net             1425 ml   LBM: Last BM Date: 04/29/17 Baseline Weight: Weight: 34 kg (75 lb) Most recent weight: Weight: 30.1 kg (66 lb 4.8 oz)       Palliative Assessment/Data:    Flowsheet Rows     Most Recent Value  Intake Tab  Referral Department  Hospitalist  Unit at Time of Referral  Med/Surg Unit  Palliative Care Primary Diagnosis  Other (Comment)  Date Notified  04/27/17  Palliative Care Type  New Palliative care  Reason for referral  Clarify Goals of Care  Date of Admission  04/25/17  Date first seen by Palliative Care  04/27/17  # of days Palliative referral response time  0 Day(s)  # of days IP prior to Palliative referral  2  Clinical Assessment  Palliative Performance Scale Score  40%  Psychosocial & Spiritual Assessment  Palliative Care Outcomes  Patient/Family meeting held?  Yes  Who was at the meeting?  patient, son, friend  Palliative Care Outcomes  Clarified goals of care, Counseled regarding hospice, Provided end of life care assistance, ACP counseling assistance, Provided psychosocial or spiritual support, Linked to palliative care logitudinal support      Patient Active Problem List   Diagnosis Date Noted  . Adult failure to thrive   . Goals of care, counseling/discussion   . Palliative care encounter   . Hypertension 04/25/2017  . Aortic stenosis 04/25/2017  . Pulmonary HTN (HCC) 04/25/2017  . History of total left hip arthroplasty  04/25/2017  . Protein calorie malnutrition (HCC) 04/25/2017  . SIRS (systemic inflammatory response syndrome) (HCC) 04/25/2017  . Diarrhea 04/25/2017  . Dehydration with hyponatremia 04/25/2017  . Pressure injury of skin 04/25/2017  . Greater trochanteric bursitis of left hip 04/25/2017  . Colitis, Clostridium difficile   . Hip fracture (HCC) 03/10/2017  . Leukocytosis 03/10/2017  . Constipation 01/26/2017  . Protein-calorie malnutrition, severe 01/24/2017  . Essential hypertension 01/22/2017  . Hypertensive urgency 01/22/2017  . Hypokalemia 01/22/2017  . Closed subcapital fracture of femur, left, initial encounter (HCC) 01/22/2017  . Cardiac murmur 01/22/2017  . Cystitis   . Fall     Palliative Care Assessment & Plan   Patient Profile: 83 y.o. female  with past medical history of severe malnutrition (66 lbs), HTN urgency, UTI, aortic stenosis,moderate to severe pulmonary HTN, and dementia (mild?) who was admitted on 04/25/2017 with sepsis secondary to C-diff as well as left hip bursitis.  Mrs. Gheen fractured her left hip in March and underwent repair by nailing.  She then fell again in May fracturing the left hip again and underwent hemiarthroplasty.  She was admitted with a fever of 102, WBC of 22 and diarrhea.  She had recently been on keflex for an abscess.  Her stool test was found to be c-diff toxin positive.  She also has a stage 3 pressure injury to her coccyx. At this point she is no longer taking anything by mouth but sips of water. She is also refusing medications. She had been started on Marinol for appetite stimulation but this is been ineffective except for providing 1 day where she felt like eating.  Assessment: Patient is mostly withdrawn and noncommunicative except to ask for water. She is confused, anxious and agitated when awake. Her son is at the bedside. Emotional support offered as well as further discussions regarding residential hospice. Her son, Merlyn AlbertFred, is quite  tearful. He recognizes that she is at end-of-life. He is receptive to residential hospice,  but he stops short of making a final decision; "I kind of had it in my mind that I had until tomorrow".  Recommendations/Plan:  Continue supportive care for now including treatment for C. difficile if patient will take her medications  She is a DO NOT RESUSCITATE DO NOT INTUBATE  No temporary or permanent feeding tubes  Allowed to eat and drink for comfort  We'll add low-dose morphine concentrate as patient is not taking pills consistently for more adequate pain control  [facility] of potential admission for 05/01/2017. But further conversation will need to take place with patient's son to finalize this. He does appear to be leaning in this direction. He asked me to ascertain whether they had a bed today, but then he left the room before I could convey information to him  Palliative medicine to stay involved and will follow-up with patient's son to finalize disposition   Goals of Care and Additional Recommendations:  Limitations on Scope of Treatment: Minimize Medications, Initiate Comfort Feeding, No Artificial Feeding, No Chemotherapy, No Diagnostics, No Glucose Monitoring, No Hemodialysis, No Radiation, No Surgical Procedures and No Tracheostomy  Code Status:    Code Status Orders        Start     Ordered   04/25/17 1241  Do not attempt resuscitation (DNR)  Continuous    Question Answer Comment  In the event of cardiac or respiratory ARREST Do not call a "code blue"   In the event of cardiac or respiratory ARREST Do not perform Intubation, CPR, defibrillation or ACLS   In the event of cardiac or respiratory ARREST Use medication by any route, position, wound care, and other measures to relive pain and suffering. May use oxygen, suction and manual treatment of airway obstruction as needed for comfort.      04/25/17 1240    Code  Status History    Date Active Date Inactive Code Status Order ID Comments User Context   04/25/2017  7:20 AM 04/25/2017 12:40 PM DNR [id number]  Russella DarEllis, Allison L, NP ED   03/10/2017  7:43 PM 03/13/2017  4:56 PM DNR [id number]  Michael Litterarter, Nikki, MD ED   01/22/2017  3:39 PM 01/25/2017  6:13 PM DNR [id number]  Clydia LlanoElmahi, Mutaz, MD Inpatient   01/22/2017 12:35 AM 01/22/2017  3:39 PM Full Code [id number]  Briscoe Deutscherpyd, Timothy S, MD ED       Prognosis:   < 2 weeks in the setting of sepsis secondary to C. difficile colitis that is not responding to treatment, protein calorie malnutrition with weight of 66 pounds and albumin 2.0, moderate pulmonary hypertension. Patient is no longer taking anything by mouth except sips of water and is now refusing medications. Patient has abdominal pain and is developing potentially  terminal agitation  Discharge Planning:  Likely residential hospice but son has not definitely committed to this discharge plan  Care plan was discussed with Dr. Izola PriceMyers  Thank you for allowing the Palliative Medicine Team to assist in the care of this patient.   Time In: 0800 Time Out: 0835 Total Time 35 min Prolonged Time Billed  no  Greater than 50%  of this time was spent counseling and coordinating care related to the above assessment and plan.  Dory Horn, NP  Please contact Palliative Medicine Team phone at [phone] for questions and concerns.

## 2017-05-01 DIAGNOSIS — I272 Pulmonary hypertension, unspecified: Secondary | ICD-10-CM

## 2017-05-01 LAB — BASIC METABOLIC PANEL
Anion gap: 5 (ref 5–15)
BUN: 10 mg/dL (ref 6–20)
CALCIUM: 8.2 mg/dL — AB (ref 8.9–10.3)
CO2: 22 mmol/L (ref 22–32)
Chloride: 117 mmol/L — ABNORMAL HIGH (ref 101–111)
Creatinine, Ser: 0.5 mg/dL (ref 0.44–1.00)
GFR calc Af Amer: >60 mL/min (ref >60)
GLUCOSE: 128 mg/dL — AB (ref 65–99)
Potassium: 4 mmol/L (ref 3.5–5.1)
Sodium: 144 mmol/L (ref 135–145)

## 2017-05-01 LAB — CBC
HEMATOCRIT: 31.8 % — AB (ref 36.0–46.0)
Hemoglobin: 10.1 g/dL — ABNORMAL LOW (ref 12.0–15.0)
MCH: 27.9 pg (ref 26.0–34.0)
MCHC: 31.8 g/dL (ref 30.0–36.0)
MCV: 87.8 fL (ref 78.0–100.0)
PLATELETS: 503 10*3/uL — AB (ref 150–400)
RBC: 3.62 MIL/uL — ABNORMAL LOW (ref 3.87–5.11)
RDW: 14.1 % (ref 11.5–15.5)
WBC: 15.9 10*3/uL — ABNORMAL HIGH (ref 4.0–10.5)

## 2017-05-01 MED ORDER — MORPHINE SULFATE 10 MG/5ML PO SOLN: 2.5000 mg | ORAL | 0 refills | Status: AC | PRN

## 2017-05-01 MED ORDER — DRONABINOL 2.5 MG PO CAPS: 2.5000 mg | ORAL_CAPSULE | Freq: Two times a day (BID) | ORAL | Status: AC

## 2017-05-01 MED ORDER — VANCOMYCIN 50 MG/ML ORAL SOLUTION: 125.0000 mg | Freq: Four times a day (QID) | ORAL | 0 refills | Status: AC

## 2017-05-01 NOTE — Discharge Instructions (Signed)
Clostridium Difficile Infection   Clostridium difficile (C. difficile or C. diff) infection causes inflammation of the large intestine (colon). This condition can result in damage to the lining of your colon and may lead to another condition called colitis. This infection can be passed from person to person (is contagious).  Follow these instructions at home:  Eating and drinking   · Drink enough fluid to keep your pee (urine) clear or pale yellow.  · Avoid drinking:  ? Milk.  ? Caffeine.  ? Alcohol.  · Follow exact instructions from your doctor about how to get enough fluid in your body (rehydrate).  · Eat small meals often instead of large meals.  Medicines   · Take your antibiotic medicine as told by your doctor. Do not stop taking the antibiotic even if you start to feel better unless your doctor told you to do that.  · Take over-the-counter and prescription medicines only as told by your doctor.  · Do not use medicines to help with watery poop (diarrhea).  General instructions   · Wash your hands fully before you prepare food and after you use the bathroom. Make sure people who live with you also wash their  · hands often.  · Clean the surfaces that you touch. Use a product that contains chlorine bleach.  · Keep all follow-up visits as told by your doctor. This is important.  Contact a doctor if:  · Your symptoms do not get better with treatment.  · Your symptoms get worse with treatment.  · Your symptoms go away and then come back.  · You have a fever.  · You have new symptoms.  Get help right away if:  · You have more pain or tenderness in your belly (abdomen).  · Your poop (stool) is mostly bloody.  · Your poop looks dark black and tarry.  · You cannot eat or drink without throwing up (vomiting).  · You have signs of dehydration, such as:  ? Dark pee, very little pee, or no pee.  ? Cracked lips.  ? Not making tears when you cry.  ? Dry mouth.  ? Sunken eyes.  ? Feeling sleepy.  ? Feeling weak.  ? Feeling  dizzy.  This information is not intended to replace advice given to you by your health care provider. Make sure you discuss any questions you have with your health care provider.  Document Released: 08/21/2009 Document Revised: 03/31/2016 Document Reviewed: 04/27/2015  Elsevier Interactive Patient Education © 2017 [employer]

## 2017-05-01 NOTE — Progress Notes (Signed)
[facility] Anticipated discharge date: 6/25 Family notified: at bedside Transportation by [facility]- called at 2:20pm  CSW signing off.  Burna SisJenna H. Alayna Mabe, LCSW Clinical Social Worker [phone]

## 2017-05-01 NOTE — Progress Notes (Signed)
Daily Progress Note   Patient Name: Jamie Carlson       Date: 05/01/2017 DOB: [birth year]  Age: 83 y.o. MRN#: [id number] Attending Physician: Dorothea Ogle, MD Primary Care Physician: Shirlean Mylar, MD Admit Date: 04/25/2017  Reason for Consultation/Follow-up: Establishing goals of care, Hospice Evaluation and Psychosocial/spiritual support  Subjective: Makayela is very lethargic today. She will briefly open her eyes, but quickly falls back asleep. No eating and only a few sips of water. She has also refused all medication. Friend, Phillis is at her bedside.   Length of Stay: 6  Current Medications: Scheduled Meds:  . acetaminophen  650 mg Oral Q6H  . aspirin  325 mg Oral Daily  . Chlorhexidine Gluconate Cloth  6 each Topical Q0600  . dronabinol  2.5 mg Oral BID AC  . enoxaparin (LOVENOX) injection  20 mg Subcutaneous Q24H  . feeding supplement (ENSURE ENLIVE)  237 mL Oral BID BM  . feeding supplement (PRO-STAT SUGAR FREE 64)  30 mL Oral BID  . mirtazapine  7.5 mg Oral QHS  . multivitamin with minerals  1 tablet Oral Daily  . mupirocin ointment   Nasal BID  . vancomycin  125 mg Oral Q6H    Continuous Infusions: . dextrose 5 % and 0.9 % NaCl with KCl 20 mEq/L 75 mL/hr at 05/01/17 0503    PRN Meds: LORazepam, morphine, ondansetron **OR** ondansetron (ZOFRAN) IV, traMADol  Physical Exam  Constitutional:  Acutely ill appearing elderly female  HENT:  Head: Normocephalic and atraumatic.  Temporal wasting  Cardiovascular:  Irregular  Pulmonary/Chest: Effort normal.  Neurological:  Lethargic. Will wake to physical stimuli, but quickly falls back asleep.  Skin: Skin is warm and dry. There is pallor.  Psychiatric:  Asleep  Nursing note and vitals reviewed.           Vital Signs:  BP (!) 143/93 (BP Location: Left Arm)   Pulse 94   Temp 97.5 F (36.4 C) (Axillary)   Resp 16   Ht 4\' 11"  (1.499 m)   Wt 30.1 kg (66 lb 4.8 oz)   SpO2 93%   BMI 13.39 kg/m  SpO2: SpO2: 93 % O2 Device: O2 Device: Not Delivered O2 Flow Rate:    Intake/output summary:   Intake/Output Summary (Last 24 hours) at 05/01/17 1115 Last data filed  at 05/01/17 0100  Gross per 24 hour  Intake             2070 ml  Output                0 ml  Net             2070 ml   LBM: Last BM Date: 04/30/17 Baseline Weight: Weight: 34 kg (75 lb) Most recent weight: Weight: 30.1 kg (66 lb 4.8 oz)       Palliative Assessment/Data: PPS 20%   Flowsheet Rows     Most Recent Value  Intake Tab  Referral Department  Hospitalist  Unit at Time of Referral  Med/Surg Unit  Palliative Care Primary Diagnosis  Other (Comment)  Date Notified  04/27/17  Palliative Care Type  New Palliative care  Reason for referral  Clarify Goals of Care  Date of Admission  04/25/17  Date first seen by Palliative Care  04/27/17  # of days Palliative referral response time  0 Day(s)  # of days IP prior to Palliative referral  2  Clinical Assessment  Palliative Performance Scale Score  40%  Psychosocial & Spiritual Assessment  Palliative Care Outcomes  Patient/Family meeting held?  Yes  Who was at the meeting?  patient, son, friend  Palliative Care Outcomes  Clarified goals of care, Counseled regarding hospice, Provided end of life care assistance, ACP counseling assistance, Provided psychosocial or spiritual support, Linked to palliative care logitudinal support      Patient Active Problem List   Diagnosis Date Noted  . Palliative care by specialist   . Adult failure to thrive   . Goals of care, counseling/discussion   . Palliative care encounter   . Hypertension 04/25/2017  . Aortic stenosis 04/25/2017  . Pulmonary HTN (HCC) 04/25/2017  . History of total left hip arthroplasty 04/25/2017  . Protein calorie  malnutrition (HCC) 04/25/2017  . SIRS (systemic inflammatory response syndrome) (HCC) 04/25/2017  . Diarrhea 04/25/2017  . Dehydration with hyponatremia 04/25/2017  . Pressure injury of skin 04/25/2017  . Greater trochanteric bursitis of left hip 04/25/2017  . Colitis, Clostridium difficile   . Hip fracture (HCC) 03/10/2017  . Leukocytosis 03/10/2017  . Constipation 01/26/2017  . Protein-calorie malnutrition, severe 01/24/2017  . Essential hypertension 01/22/2017  . Hypertensive urgency 01/22/2017  . Hypokalemia 01/22/2017  . Closed subcapital fracture of femur, left, initial encounter (HCC) 01/22/2017  . Cardiac murmur 01/22/2017  . Cystitis   . Fall     Palliative Care Assessment & Plan   Patient Profile: 83 y.o. female  with past medical history of severe malnutrition (66 lbs), HTN urgency, UTI, aortic stenosis,moderate to severe pulmonary HTN, and dementia (mild?) who was admitted on 04/25/2017 with sepsis secondary to C-diff as well as left hip bursitis.  Mrs. Garald BraverGehrke fractured her left hip in March and underwent repair by nailing.  She then fell again in May fracturing the left hip again and underwent hemiarthroplasty.  She was admitted with a fever of 102, WBC of 22 and diarrhea.  She had recently been on keflex for an abscess.  Her stool test was found to be c-diff toxin positive. She also has a stage 3 pressure injury to her coccyx.  At this point she is no longer taking anything by mouth but sips of water. She is also refusing medications. She had been started on Marinol for appetite stimulation but this is been ineffective except for providing 1 day where she felt like  eating.  Assessment: [place] is now lethargic and predominantly sleeping. She has only had a few sips of water, and is otherwise refusing food and medication. After extensive discussions, her family did decided to proceed with residential hospice. They prefer [facility].   Recommendations/Plan:  DNR, transition  to residential hospice when bed available  Plan to keep fluids running until time of discharge  Goals of Care and Additional Recommendations:  Limitations on Scope of Treatment: Minimize Medications, Initiate Comfort Feeding, No Artificial Feeding, No Chemotherapy, No Diagnostics, No Glucose Monitoring, No Hemodialysis, No Radiation, No Surgical Procedures and No Tracheostomy  Code Status:    Code Status Orders        Start     Ordered   04/25/17 1241  Do not attempt resuscitation (DNR)  Continuous    Question Answer Comment  In the event of cardiac or respiratory ARREST Do not call a "code blue"   In the event of cardiac or respiratory ARREST Do not perform Intubation, CPR, defibrillation or ACLS   In the event of cardiac or respiratory ARREST Use medication by any route, position, wound care, and other measures to relive pain and suffering. May use oxygen, suction and manual treatment of airway obstruction as needed for comfort.      04/25/17 1240    Code Status History    Date Active Date Inactive Code Status Order ID Comments User Context   04/25/2017  7:20 AM 04/25/2017 12:40 PM DNR [id number]  Russella Dar, NP ED   03/10/2017  7:43 PM 03/13/2017  4:56 PM DNR [id number]  Michael Litter, MD ED   01/22/2017  3:39 PM 01/25/2017  6:13 PM DNR [id number]  Clydia Llano, MD Inpatient   01/22/2017 12:35 AM 01/22/2017  3:39 PM Full Code [id number]  Briscoe Deutscher, MD ED       Prognosis:   < 2 weeks in the setting of sepsis secondary to C. difficile colitis, protein calorie malnutrition with weight of 66 pounds and albumin 2.0, moderate pulmonary hypertension. Patient is no longer taking anything by mouth except sips of water and is now refusing medications.   Discharge Planning:  Residential hospice  Care plan was discussed with pt's friend, Phillis  Thank you for allowing the Palliative Medicine Team to assist in the care of this patient.  Total time: 15 minutes    Greater than 50%   of this time was spent counseling and coordinating care related to the above assessment and plan.  Ranae Palms, NP  Please contact Palliative Medicine Team phone at [phone] for questions and concerns.

## 2017-05-01 NOTE — Consult Note (Signed)
[facility] [employer] Received request from [place] for family interest in [facility]. Chart reviewed and met with patient's son Josph Macho and two grandsons also present. [name] completed paper work and is agreeable to transfer today. Dr. Orpah Melter to assume care per family request.   Discharge summary has been faxed to [phone].  RN please call report to [phone].  Thank you,  Erling Conte, LCSW [phone]

## 2017-05-01 NOTE — Progress Notes (Signed)
[facility] awaiting [facility], reported to nurse Okey Regalarol.

## 2017-05-01 NOTE — Progress Notes (Addendum)
CSW informed by MD that patient family in agreement to [facility]- CSW spoke with son and confirmed he is agreeable to this plan.  [facility] who will follow up on referral  CSW will continue to follow  Burna SisJenna H. Zuri Bradway, LCSW Clinical Social Worker [phone]

## 2017-05-01 NOTE — Progress Notes (Signed)
[facility] [place], family was at bedside.

## 2017-05-01 NOTE — Progress Notes (Signed)
PT Cancellation Note  Patient Details Name: Jamie Carlson MRN: [id number] DOB: 10/13/1918   Cancelled Treatment:    Reason Eval/Treat Not Completed: Patient declined, no reason specified. On arrival pt was sleeping. Took several attempts to arouse pt. Pt disoriented on waking and unaware of where she was. Attempted to have pt perform bed mobilities, however everytime PTA would assist pt to get EOB she would cry "please don't move me" and "bye bye". Attention is focused, very delirious. RN notified. Unable to motivate pt to move.   Kallie LocksHannah Lexus Barletta, PTA Pager [phone] Acute Rehab   Sheral ApleyHannah E Bethel Sirois 05/01/2017, 1:10 PM

## 2017-05-01 NOTE — Discharge Summary (Signed)
Physician Discharge Summary  Jamie Carlson ZOX:[id number] DOB: [birth year] DOA: 04/25/2017  PCP: Shirlean Mylar, MD  Admit date: 04/25/2017 Discharge date: 05/01/2017  Recommendations for Outpatient Follow-up:  1. Pt will be discharged to residential hospice [facility]  Discharge Diagnoses:  Principal Problem:   SIRS (systemic inflammatory response syndrome) (HCC) Active Problems:   Hypertension   Aortic stenosis   Pulmonary HTN (HCC)   History of total left hip arthroplasty   Protein calorie malnutrition (HCC)   Diarrhea   Dehydration with hyponatremia   Pressure injury of skin   Greater trochanteric bursitis of left hip   Palliative care encounter   Adult failure to thrive   Goals of care, counseling/discussion   Palliative care by specialist   Discharge Condition: Stable  Diet recommendation: Heart healthy diet discussed in details   History of present illness:   Brief Narrative:  83 yo female with known HTN, aortic stenosis, severe PCM, underweight, had left femoral neck fracture in March this year and underwent operative placement of screws and was discharged to SNF at that time. In April, she was released from SNF and fell at home twice and readmitted to [facility] May 4th, 2018. At that time she underwent left hip hemiarthroplasty and was discharged back to SNF. Continued to decline with poor oral intake and progressive weight loss. She now presented for worsening left hip pain, fever, diarrhea, abd pain.   Please note that review of records indicated that patient had stitch abscess last week, and was started on ABX Keflex at the SNF. Dr. Jerl Santos did not think that patient had any sings of hip infection on 04/22/2017.   Assessment & Plan:   Principal Problem: SIRS due to left trochanteric bursitis, C. Diff - pt has been started on Flagyl and oral vanc - has not had diarrhea and WBC is still up but overall trending down - slow clinical progression despite response to ABX -  still very poor oral intake  - continue oral vanc for 7 more days post discharge   Active Problems:   Diarrhea with abd pain - from C. Diff - oral vanc as noted above    Thrombocytosis - reactive, no need for further follow up    Hypertension, essential  - resume home medical regimen     Progressive failure to thrive, severe PCM - with severe PCM, underweight with BMI 13 - placed on appetite stimulant but per family not much better    Aortic stenosis - No indication for surgical intervention    History of total left hip arthroplasty - Per orthopedic recommendation, no plan for surgical intervention - weight bearing as tolerated  - has not been up much     Anemia of unclear etiology - no signs of active bleeding    Hypokalemia - has been supplemented     Dehydration with hyponatremia - Na is WNL    Pressure injury of skin - Stage III pressure injury to coccyx, present on admission - Recommendation is to cleanse coccyx wound with normal saline and packed gentle dry, fill wound depth with iodoform packing strip and cover with silicone border sacral foam - Recommendation is to change packing strip daily and foam every 3 days and as needed soilage - Turn and reposition also encouraged   DVT prophylaxis: Lovenox SQ Code Status: DNR Family Communication: family at bedside Disposition Plan: [facility]  Consultants:   Ortho   Palliative care team  Procedures:   None  Antimicrobials:  Flagyl 6/19 --> 6/23  Oral Vanc 6/19 -->  Procedures/Studies: Dg Chest 2 View  Result Date: 04/25/2017 CLINICAL DATA:  Initial evaluation for acute fever, hip pain, recent surgery. EXAM: CHEST  2 VIEW COMPARISON:  The prior radiograph from 03/10/2017. FINDINGS: Mild cardiomegaly, stable. Mediastinal silhouette within normal limits. Aortic atherosclerosis noted. Lungs mildly hypoinflated. No focal infiltrates. No pulmonary edema or pleural effusion. No  pneumothorax. No acute osseus abnormality. Diffuse osteopenia with exaggeration the normal thoracic kyphosis. IMPRESSION: 1. Shallow lung inflation with with no active cardiopulmonary disease identified. 2. Aortic atherosclerosis. Electronically Signed   By: Rise Mu M.D.   On: 04/25/2017 02:56   Mr Hip Left Wo Contrast  Result Date: 04/25/2017 CLINICAL DATA:  Fever of unknown origin. Elevated white blood count. Left hip pain. EXAM: MR OF THE LEFT HIP WITHOUT CONTRAST TECHNIQUE: Multiplanar, multisequence MR imaging was performed. No intravenous contrast was administered. COMPARISON:  Radiographs dated 04/25/2017 FINDINGS: Bones: No acute abnormalities. Proximal femoral prosthesis obscures detail of the left hip. Joint or bursal effusion Joint effusion:  None appreciable Bursae: Prominent fluid in the greater trochanteric bursa of the left hip. There is also fluid superficial to the iliotibial band adjacent to the greater trochanteric bursa. Muscles and tendons Muscles and tendons:  Negative Other findings Miscellaneous: Slight edema in the subcutaneous fat at the anterolateral aspect of the proximal left thigh, nonspecific. 6 cm simple cyst in the left side of the pelvis, probably arising from the left ovary. IMPRESSION: 1. Left greater trochanteric bursitis. 2. Small amount of fluid superficial to the left greater trochanteric bursa superficial to the iliotibial band. This is felt represent a pseudo bursa. Electronically Signed   By: Francene Boyers M.D.   On: 04/25/2017 09:50   Dg Abd Portable 1v  Result Date: 04/25/2017 CLINICAL DATA:  Diarrhea EXAM: PORTABLE ABDOMEN - 1 VIEW COMPARISON:  03/11/2017 FINDINGS: Nonobstructive bowel gas pattern. No free air or organomegaly. No suspicious calcification. Aortic calcifications. Prior left hip replacement. IMPRESSION: No acute findings. Electronically Signed   By: Charlett Nose M.D.   On: 04/25/2017 08:35   Dg Hip Unilat W Or Wo Pelvis 2-3 Views  Left  Result Date: 04/25/2017 CLINICAL DATA:  Initial evaluation for acute fever, hip pain, recent surgery. EXAM: DG HIP (WITH OR WITHOUT PELVIS) 2-3V LEFT COMPARISON:  Prior radiograph from 03/10/2017. FINDINGS: There has been interval placement of a left total hip arthroplasty. Previously seen left-sided cannulated lack fixation screws have been removed. Arthroplasty appears well seated without complication. No acute fracture or dislocation. Visualized bony pelvis intact. Osteopenia. Degenerative changes noted within the lower lumbar spine. No acute soft tissue abnormality. IMPRESSION: 1. Left total hip arthroplasty in place without complication. 2. No other acute osseous abnormality about the left hip. Electronically Signed   By: Rise Mu M.D.   On: 04/25/2017 02:58   Discharge Exam: Vitals:   04/30/17 1400 05/01/17 0446  BP: 125/65 (!) 143/93  Pulse: 85 94  Resp: 16 16  Temp: 98 F (36.7 C) 97.5 F (36.4 C)   Vitals:   04/29/17 1546 04/30/17 0700 04/30/17 1400 05/01/17 0446  BP: 107/65 126/62 125/65 (!) 143/93  Pulse: 85 89 85 94  Resp: 19 16 16 16   Temp: 98 F (36.7 C) 98.2 F (36.8 C) 98 F (36.7 C) 97.5 F (36.4 C)  TempSrc: Oral Oral Oral Axillary  SpO2: 94% 93% 94% 93%  Weight:      Height:       Physical Exam:  General: Pt is sleeping, easy to awake  Cardiovascular: Regular rate and rhythm, no rubs, no gallops Respiratory: diminished breath sounds at bases  Abdominal: Soft, non tender, non distended, bowel sounds +, no guarding  Discharge Instructions  Discharge Instructions    Diet - low sodium heart healthy    Complete by:  As directed    Increase activity slowly    Complete by:  As directed      Allergies as of 05/01/2017      Reactions   Sulfa Antibiotics Other (See Comments)   Unknown allergic reaction   Bee Venom Rash      Medication List    STOP taking these medications   cephALEXin 500 MG capsule Commonly known as:  KEFLEX    docusate sodium 100 MG capsule Commonly known as:  COLACE   METAMUCIL 28.3 % Powd Generic drug:  Psyllium   oxyCODONE 5 MG immediate release tablet Commonly known as:  Oxy IR/ROXICODONE   polyethylene glycol packet Commonly known as:  MIRALAX / GLYCOLAX   PROBIOTIC PO   senna 8.6 MG Tabs tablet Commonly known as:  SENOKOT   sennosides-docusate sodium 8.6-50 MG tablet Commonly known as:  SENOKOT-S     TAKE these medications   acetaminophen 325 MG tablet Commonly known as:  TYLENOL Take 650 mg by mouth every 6 (six) hours. For 14 days What changed:  Another medication with the same name was removed. Continue taking this medication, and follow the directions you see here.   amLODipine 5 MG tablet Commonly known as:  NORVASC Take 1 tablet (5 mg total) by mouth daily.   aspirin 325 MG tablet Take 325 mg by mouth daily. What changed:  Another medication with the same name was removed. Continue taking this medication, and follow the directions you see here.   dronabinol 2.5 MG capsule Commonly known as:  MARINOL Take 1 capsule (2.5 mg total) by mouth 2 (two) times daily before lunch and supper.   LORazepam 0.5 MG tablet Commonly known as:  ATIVAN Take 0.5 mg by mouth 2 (two) times daily as needed for anxiety.   mirtazapine 7.5 MG tablet Commonly known as:  REMERON Take 7.5 mg by mouth at bedtime.   morphine 10 MG/5ML solution Take 1.3 mLs (2.6 mg total) by mouth every 4 (four) hours as needed for severe pain.   NUTRITIONAL SUPPLEMENT Liqd Take 240 mLs by mouth 2 (two) times daily. MedPass   vancomycin 50 mg/mL oral solution Commonly known as:  VANCOCIN Take 2.5 mLs (125 mg total) by mouth every 6 (six) hours. Continue taking for 7 more days       Contact information for follow-up providers    Shirlean Mylar, MD Follow up.   Specialty:  Family Medicine Why:  as needed Contact information: [address] [place]  [address] [phone]            Contact information for after-discharge care    Destination    [facility] .   Specialty:  Skilled Nursing Facility Contact information: [address] [place] [address] [phone]                   The results of significant diagnostics from this hospitalization (including imaging, microbiology, ancillary and laboratory) are listed below for reference.     Microbiology: Recent Results (from the past 240 hour(s))  Blood Culture (routine x 2)     Status: None   Collection Time:  04/25/17  2:15 AM  Result Value Ref Range Status   Specimen Description BLOOD BLOOD LEFT FOREARM  Final   Special Requests   Final    BOTTLES DRAWN AEROBIC AND ANAEROBIC Blood Culture adequate volume   Culture NO GROWTH 5 DAYS  Final   Report Status 04/30/2017 FINAL  Final  Urine culture     Status: None   Collection Time: 04/25/17  3:04 AM  Result Value Ref Range Status   Specimen Description URINE, CATHETERIZED  Final   Special Requests NONE  Final   Culture NO GROWTH  Final   Report Status 04/26/2017 FINAL  Final  Blood Culture (routine x 2)     Status: None   Collection Time: 04/25/17  4:00 AM  Result Value Ref Range Status   Specimen Description BLOOD RIGHT ARM  Final   Special Requests IN PEDIATRIC BOTTLE Blood Culture adequate volume  Final   Culture NO GROWTH 5 DAYS  Final   Report Status 04/30/2017 FINAL  Final  C difficile quick scan w PCR reflex     Status: Abnormal   Collection Time: 04/25/17  6:31 AM  Result Value Ref Range Status   C Diff antigen POSITIVE (A) NEGATIVE Final   C Diff toxin NEGATIVE NEGATIVE Final   C Diff interpretation Results are indeterminate. See PCR results.  Final  Clostridium Difficile by PCR     Status: Abnormal   Collection Time: 04/25/17  6:31 AM  Result Value Ref Range Status   Toxigenic C Difficile by pcr POSITIVE (A) NEGATIVE Final    Comment: Positive for toxigenic C. difficile with  little to no toxin production. Only treat if clinical presentation suggests symptomatic illness.  Gastrointestinal Panel by PCR , Stool     Status: None   Collection Time: 04/25/17  8:39 AM  Result Value Ref Range Status   Campylobacter species NOT DETECTED NOT DETECTED Final   Plesimonas shigelloides NOT DETECTED NOT DETECTED Final   Salmonella species NOT DETECTED NOT DETECTED Final   Yersinia enterocolitica NOT DETECTED NOT DETECTED Final   Vibrio species NOT DETECTED NOT DETECTED Final   Vibrio cholerae NOT DETECTED NOT DETECTED Final   Enteroaggregative E coli (EAEC) NOT DETECTED NOT DETECTED Final   Enteropathogenic E coli (EPEC) NOT DETECTED NOT DETECTED Final   Enterotoxigenic E coli (ETEC) NOT DETECTED NOT DETECTED Final   Shiga like toxin producing E coli (STEC) NOT DETECTED NOT DETECTED Final   Shigella/Enteroinvasive E coli (EIEC) NOT DETECTED NOT DETECTED Final   Cryptosporidium NOT DETECTED NOT DETECTED Final   Cyclospora cayetanensis NOT DETECTED NOT DETECTED Final   Entamoeba histolytica NOT DETECTED NOT DETECTED Final   Giardia lamblia NOT DETECTED NOT DETECTED Final   Adenovirus F40/41 NOT DETECTED NOT DETECTED Final   Astrovirus NOT DETECTED NOT DETECTED Final   Norovirus GI/GII NOT DETECTED NOT DETECTED Final   Rotavirus A NOT DETECTED NOT DETECTED Final   Sapovirus (I, II, IV, and V) NOT DETECTED NOT DETECTED Final  MRSA PCR Screening     Status: Abnormal   Collection Time: 04/25/17  8:54 PM  Result Value Ref Range Status   MRSA by PCR POSITIVE (A) NEGATIVE Final    Labs: Basic Metabolic Panel:  Recent Labs Lab 04/27/17 0704 04/28/17 0619 04/29/17 0523 04/30/17 0647 05/01/17 0527  NA 140 144 138 140 144  K 3.5 4.3 4.3 4.0 4.0  CL 112* 117* 113* 113* 117*  CO2 25 23 20* 22 22  GLUCOSE 123* 110*  136* 148* 128*  BUN 7 9 11 7 10   CREATININE 0.47 0.56 0.55 0.49 0.50  CALCIUM 7.9* 8.5* 8.1* 8.2* 8.2*   Liver Function Tests:  Recent Labs Lab  04/25/17 0206 04/26/17 0734  AST 21 14*  ALT 17 13*  ALKPHOS 71 72  BILITOT 0.5 0.3  PROT 5.4* 5.1*  ALBUMIN 2.2* 2.0*   CBC:  Recent Labs Lab 04/25/17 0206  04/27/17 0704 04/28/17 0619 04/29/17 0523 04/30/17 0647 05/01/17 0527  WBC 22.4*  < > 17.4* 15.7* 15.6* 17.3* 15.9*  NEUTROABS 18.8*  --   --   --   --   --   --   HGB 10.5*  < > 8.6* 9.1* 10.1* 10.5* 10.1*  HCT 32.1*  < > 27.8* 28.8* 32.8* 33.7* 31.8*  MCV 88.2  < > 89.1 89.7 90.4 87.8 87.8  PLT 543*  < > 494* 482* 480* 459* 503*  < > = values in this interval not displayed.  SIGNED: Time coordinating discharge: 45 minutes  Debbora PrestoIskra Magick-Lanyla Costello, MD  [facility] 05/01/2017, 11:10 AM Pager [phone]  If 7PM-7AM, please contact night-coverage [email or web address] Password [email or web address]

## 2017-05-22 DIAGNOSIS — Z96642 Presence of left artificial hip joint: Secondary | ICD-10-CM | POA: Diagnosis not present

## 2017-05-22 DIAGNOSIS — A0472 Enterocolitis due to Clostridium difficile, not specified as recurrent: Secondary | ICD-10-CM | POA: Diagnosis not present

## 2017-05-22 DIAGNOSIS — R2681 Unsteadiness on feet: Secondary | ICD-10-CM | POA: Diagnosis not present

## 2017-05-22 DIAGNOSIS — R600 Localized edema: Secondary | ICD-10-CM | POA: Diagnosis not present

## 2017-05-22 DIAGNOSIS — S32502S Unspecified fracture of left pubis, sequela: Secondary | ICD-10-CM | POA: Diagnosis not present

## 2017-05-22 DIAGNOSIS — Z515 Encounter for palliative care: Secondary | ICD-10-CM | POA: Diagnosis not present

## 2017-05-22 DIAGNOSIS — R1319 Other dysphagia: Secondary | ICD-10-CM | POA: Diagnosis not present

## 2017-05-22 DIAGNOSIS — L8990 Pressure ulcer of unspecified site, unspecified stage: Secondary | ICD-10-CM | POA: Diagnosis not present

## 2017-05-22 DIAGNOSIS — W19XXXA Unspecified fall, initial encounter: Secondary | ICD-10-CM | POA: Diagnosis not present

## 2017-05-22 DIAGNOSIS — L89159 Pressure ulcer of sacral region, unspecified stage: Secondary | ICD-10-CM | POA: Diagnosis not present

## 2017-05-22 DIAGNOSIS — I10 Essential (primary) hypertension: Secondary | ICD-10-CM | POA: Diagnosis not present

## 2017-05-22 DIAGNOSIS — L89153 Pressure ulcer of sacral region, stage 3: Secondary | ICD-10-CM | POA: Diagnosis not present

## 2017-05-22 DIAGNOSIS — R197 Diarrhea, unspecified: Secondary | ICD-10-CM | POA: Diagnosis not present

## 2017-05-22 DIAGNOSIS — E43 Unspecified severe protein-calorie malnutrition: Secondary | ICD-10-CM | POA: Diagnosis not present

## 2017-05-22 DIAGNOSIS — Z9181 History of falling: Secondary | ICD-10-CM | POA: Diagnosis not present

## 2017-05-22 DIAGNOSIS — A419 Sepsis, unspecified organism: Secondary | ICD-10-CM | POA: Diagnosis not present

## 2017-05-22 DIAGNOSIS — R1312 Dysphagia, oropharyngeal phase: Secondary | ICD-10-CM | POA: Diagnosis not present

## 2017-05-22 DIAGNOSIS — Z471 Aftercare following joint replacement surgery: Secondary | ICD-10-CM | POA: Diagnosis not present

## 2017-05-22 DIAGNOSIS — Z043 Encounter for examination and observation following other accident: Secondary | ICD-10-CM | POA: Diagnosis not present

## 2017-05-22 DIAGNOSIS — S72002S Fracture of unspecified part of neck of left femur, sequela: Secondary | ICD-10-CM | POA: Diagnosis not present

## 2017-05-22 DIAGNOSIS — R5381 Other malaise: Secondary | ICD-10-CM | POA: Diagnosis not present

## 2017-05-22 DIAGNOSIS — M6281 Muscle weakness (generalized): Secondary | ICD-10-CM | POA: Diagnosis not present

## 2017-05-22 DIAGNOSIS — R630 Anorexia: Secondary | ICD-10-CM | POA: Diagnosis not present

## 2017-05-22 DIAGNOSIS — E46 Unspecified protein-calorie malnutrition: Secondary | ICD-10-CM | POA: Diagnosis not present

## 2017-05-22 DIAGNOSIS — R278 Other lack of coordination: Secondary | ICD-10-CM | POA: Diagnosis not present

## 2017-05-24 DIAGNOSIS — R600 Localized edema: Secondary | ICD-10-CM | POA: Diagnosis not present

## 2017-05-25 DIAGNOSIS — Z515 Encounter for palliative care: Secondary | ICD-10-CM | POA: Diagnosis not present

## 2017-05-25 DIAGNOSIS — E43 Unspecified severe protein-calorie malnutrition: Secondary | ICD-10-CM | POA: Diagnosis not present

## 2017-05-25 DIAGNOSIS — L89159 Pressure ulcer of sacral region, unspecified stage: Secondary | ICD-10-CM | POA: Diagnosis not present

## 2017-05-25 DIAGNOSIS — R5381 Other malaise: Secondary | ICD-10-CM | POA: Diagnosis not present

## 2017-05-29 DIAGNOSIS — L89153 Pressure ulcer of sacral region, stage 3: Secondary | ICD-10-CM | POA: Diagnosis not present

## 2017-05-29 DIAGNOSIS — M6281 Muscle weakness (generalized): Secondary | ICD-10-CM | POA: Diagnosis not present

## 2017-05-29 DIAGNOSIS — E46 Unspecified protein-calorie malnutrition: Secondary | ICD-10-CM | POA: Diagnosis not present

## 2017-05-29 DIAGNOSIS — R1319 Other dysphagia: Secondary | ICD-10-CM | POA: Diagnosis not present

## 2017-05-31 DIAGNOSIS — R630 Anorexia: Secondary | ICD-10-CM | POA: Diagnosis not present

## 2017-06-05 DIAGNOSIS — R1319 Other dysphagia: Secondary | ICD-10-CM | POA: Diagnosis not present

## 2017-06-05 DIAGNOSIS — L89153 Pressure ulcer of sacral region, stage 3: Secondary | ICD-10-CM | POA: Diagnosis not present

## 2017-06-05 DIAGNOSIS — E46 Unspecified protein-calorie malnutrition: Secondary | ICD-10-CM | POA: Diagnosis not present

## 2017-06-05 DIAGNOSIS — M6281 Muscle weakness (generalized): Secondary | ICD-10-CM | POA: Diagnosis not present

## 2017-06-05 DIAGNOSIS — R197 Diarrhea, unspecified: Secondary | ICD-10-CM | POA: Diagnosis not present

## 2017-06-07 DIAGNOSIS — W19XXXA Unspecified fall, initial encounter: Secondary | ICD-10-CM | POA: Diagnosis not present

## 2017-06-07 DIAGNOSIS — Z043 Encounter for examination and observation following other accident: Secondary | ICD-10-CM | POA: Diagnosis not present

## 2017-06-09 DIAGNOSIS — R197 Diarrhea, unspecified: Secondary | ICD-10-CM | POA: Diagnosis not present

## 2017-06-12 DIAGNOSIS — Z79899 Other long term (current) drug therapy: Secondary | ICD-10-CM | POA: Diagnosis not present

## 2017-07-08 DEATH — deceased

## 2018-09-30 IMAGING — CR DG HIP (WITH OR WITHOUT PELVIS) 1V*L*
3 series · 3 of 3 positions shown · non-contrast
Comparison: 01/22/2017

CLINICAL DATA: Left hip pain after fall

EXAM:
DG HIP (WITH OR WITHOUT PELVIS) 1V*L*

[hip ap]
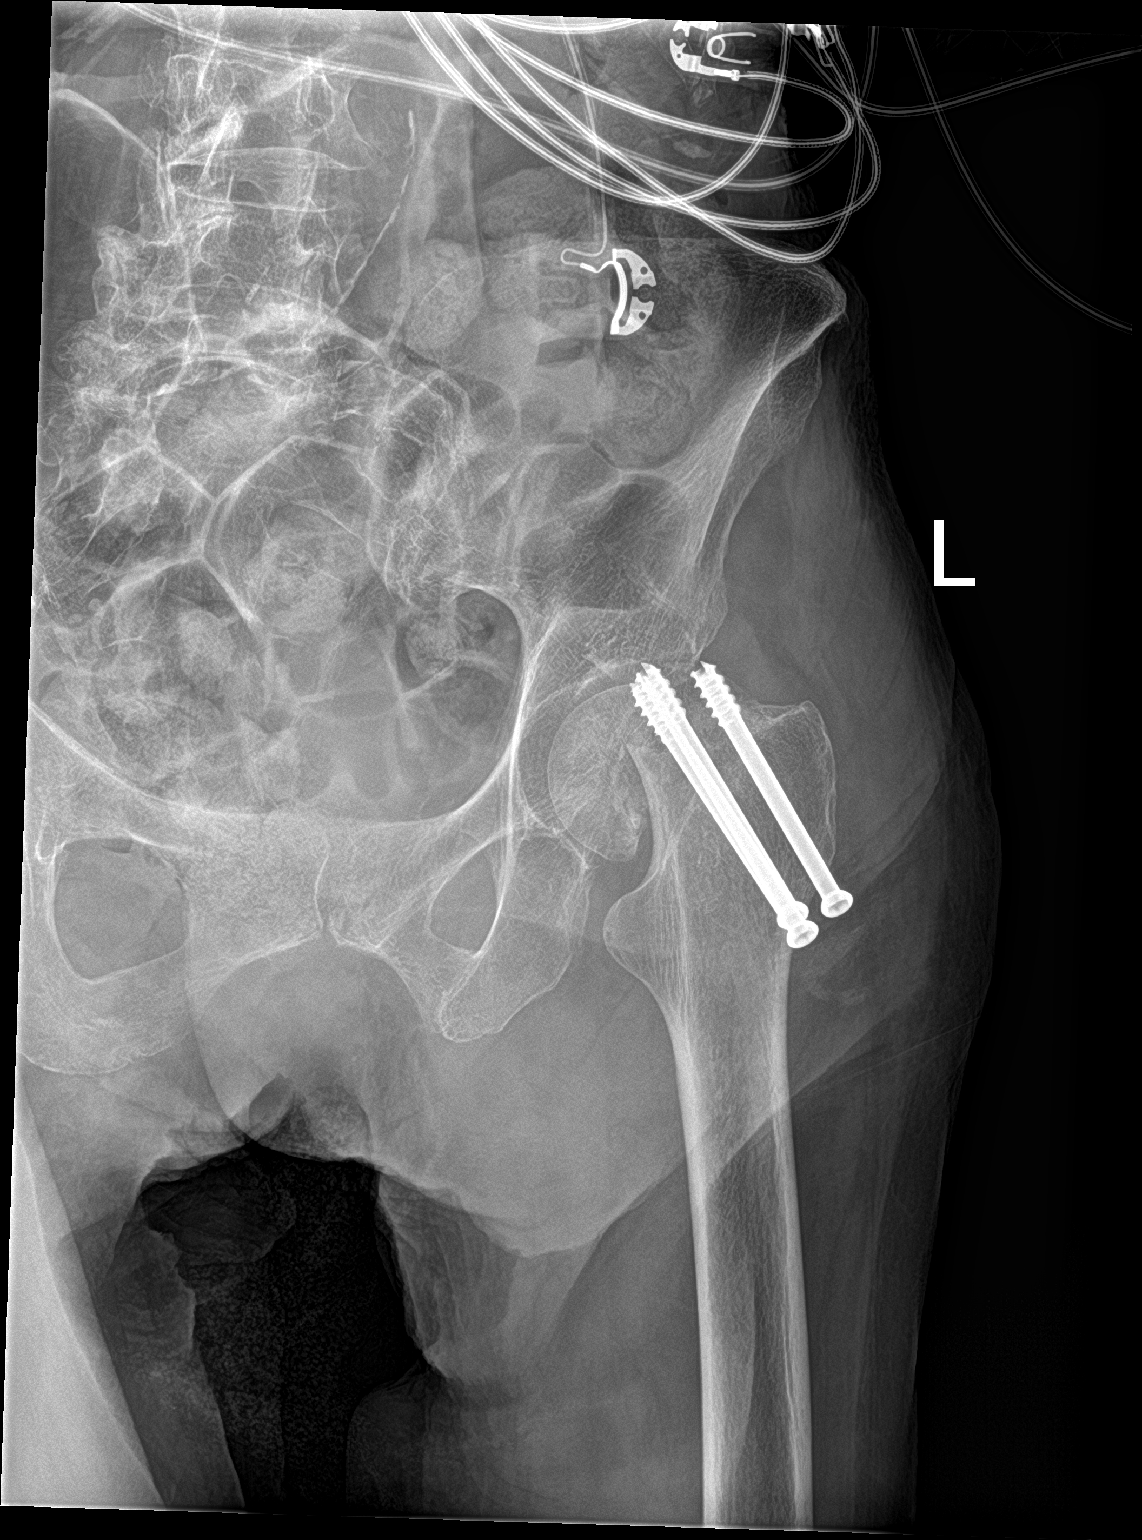

[hip lat]
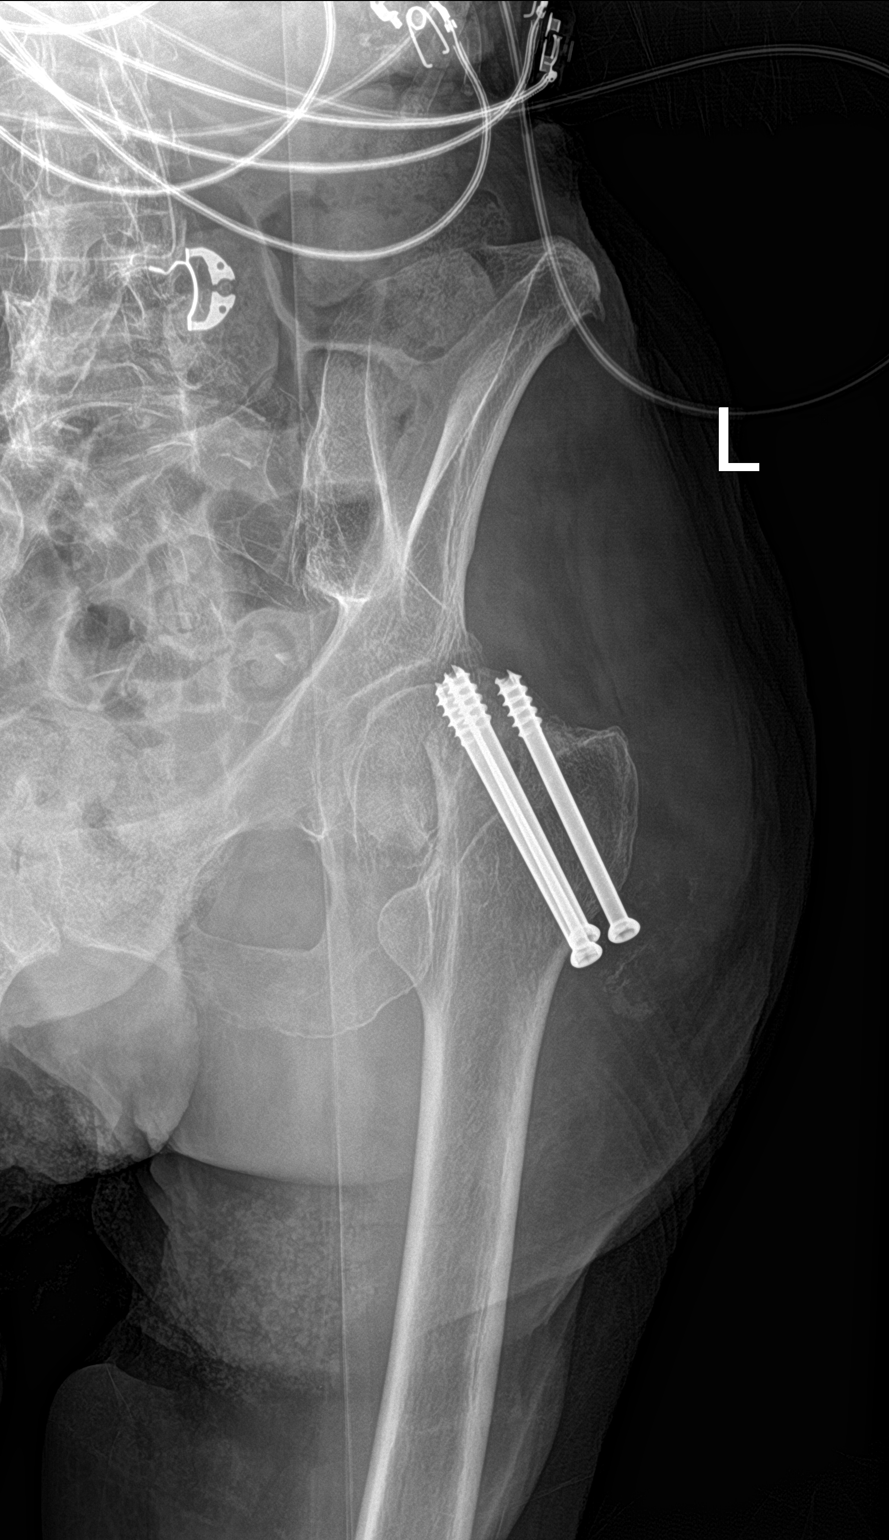

[pelvis ap]
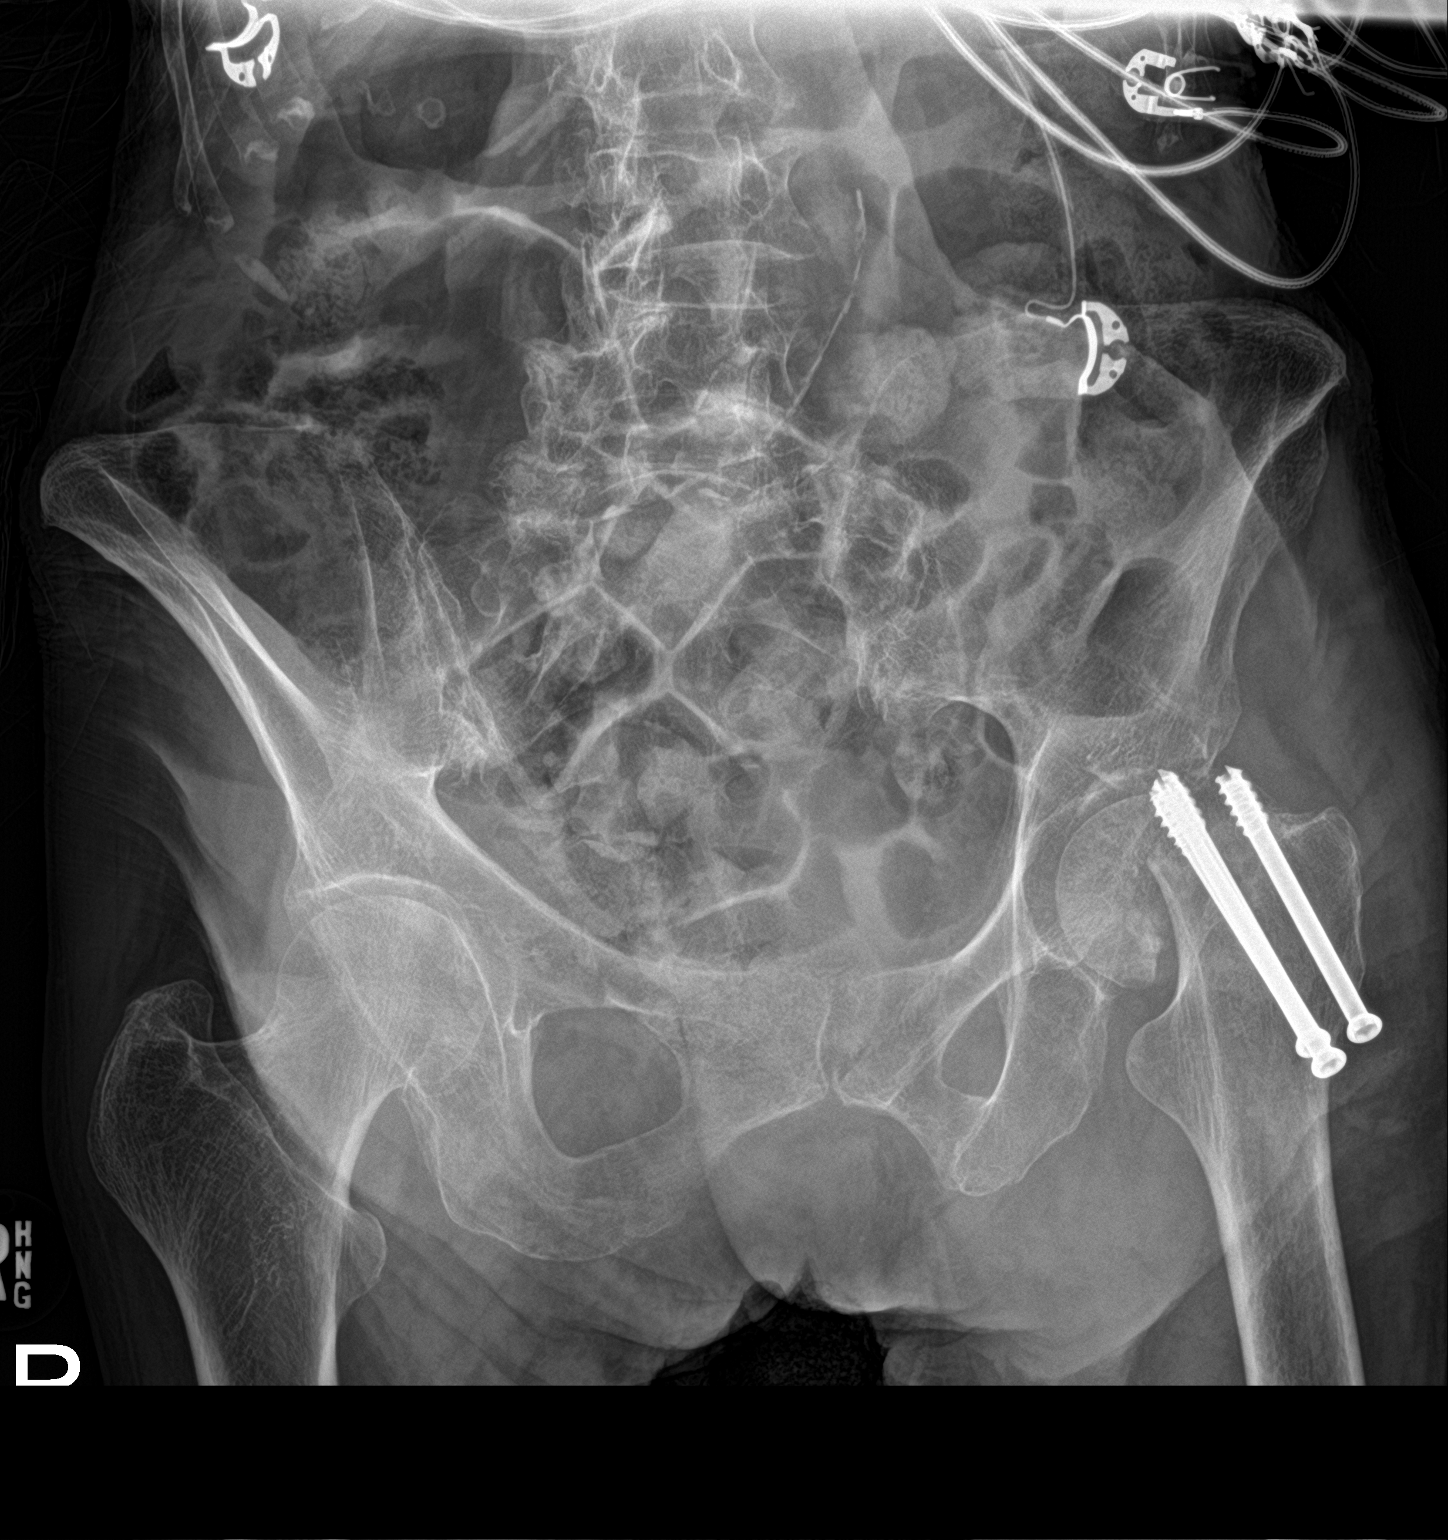

[3 of 3 positions shown; findings below may reference images not displayed]

FINDINGS: Acute superolateral displacement of the left femoral neck and shaft
relative to the femoral head since the intraoperative left femoral
neck repair on 01/22/2017. Three cannulated screws are no longer
imbedded within the femoral head. A true lateral view was unable to
be provided to determine whether the displacement is anterior or
posterior relative to the femoral head. The bony pelvis is grossly
intact. No pubic rami fractures.
IMPRESSION: New superolateral displacement of the left femoral neck and shaft
relative to the femoral head with the tips of 3 cannulated screws no
longer embedded within the left femoral head. No bony pelvic
fracture is seen.

## 2018-10-01 IMAGING — CR DG PORTABLE PELVIS
1 series · 1 of 1 positions shown · non-contrast
Comparison: 01/22/2017

CLINICAL DATA: Status post left hip hemiarthroplasty.

EXAM:
PORTABLE PELVIS 1-2 VIEWS

[AP]
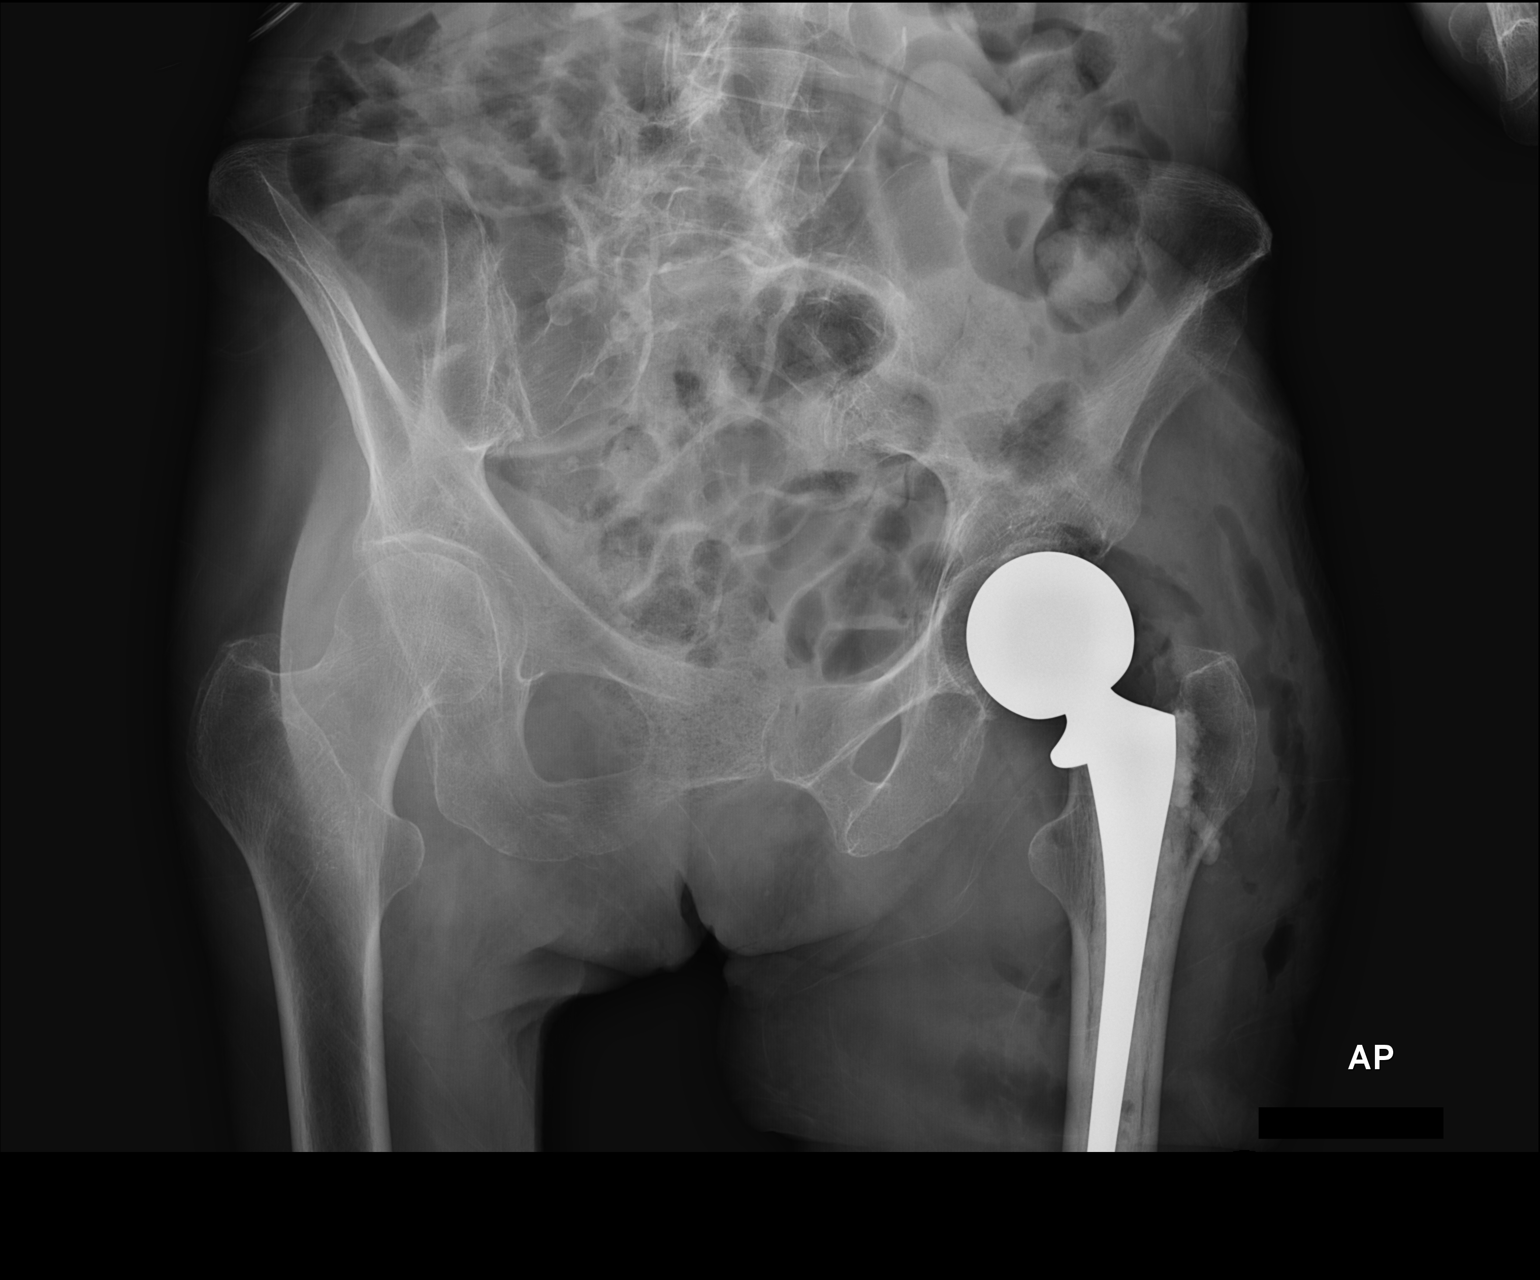

[1 of 1 positions shown; findings below may reference images not displayed]

FINDINGS: A left hip hemiarthroplasty device is identified. The hardware
components are in anatomic alignment. No periprosthetic fracture or
subluxation. Gas noted within the surrounding soft tissues.
IMPRESSION: 1. No complications status post left hip hemiarthroplasty.

## 2018-11-15 IMAGING — CR DG CHEST 2V
2 series · 2 of 2 positions shown · non-contrast
Comparison: The prior radiograph from 03/10/2017.

CLINICAL DATA: Initial evaluation for acute fever, hip pain, recent
surgery.

EXAM:
CHEST  2 VIEW

[chest lat]
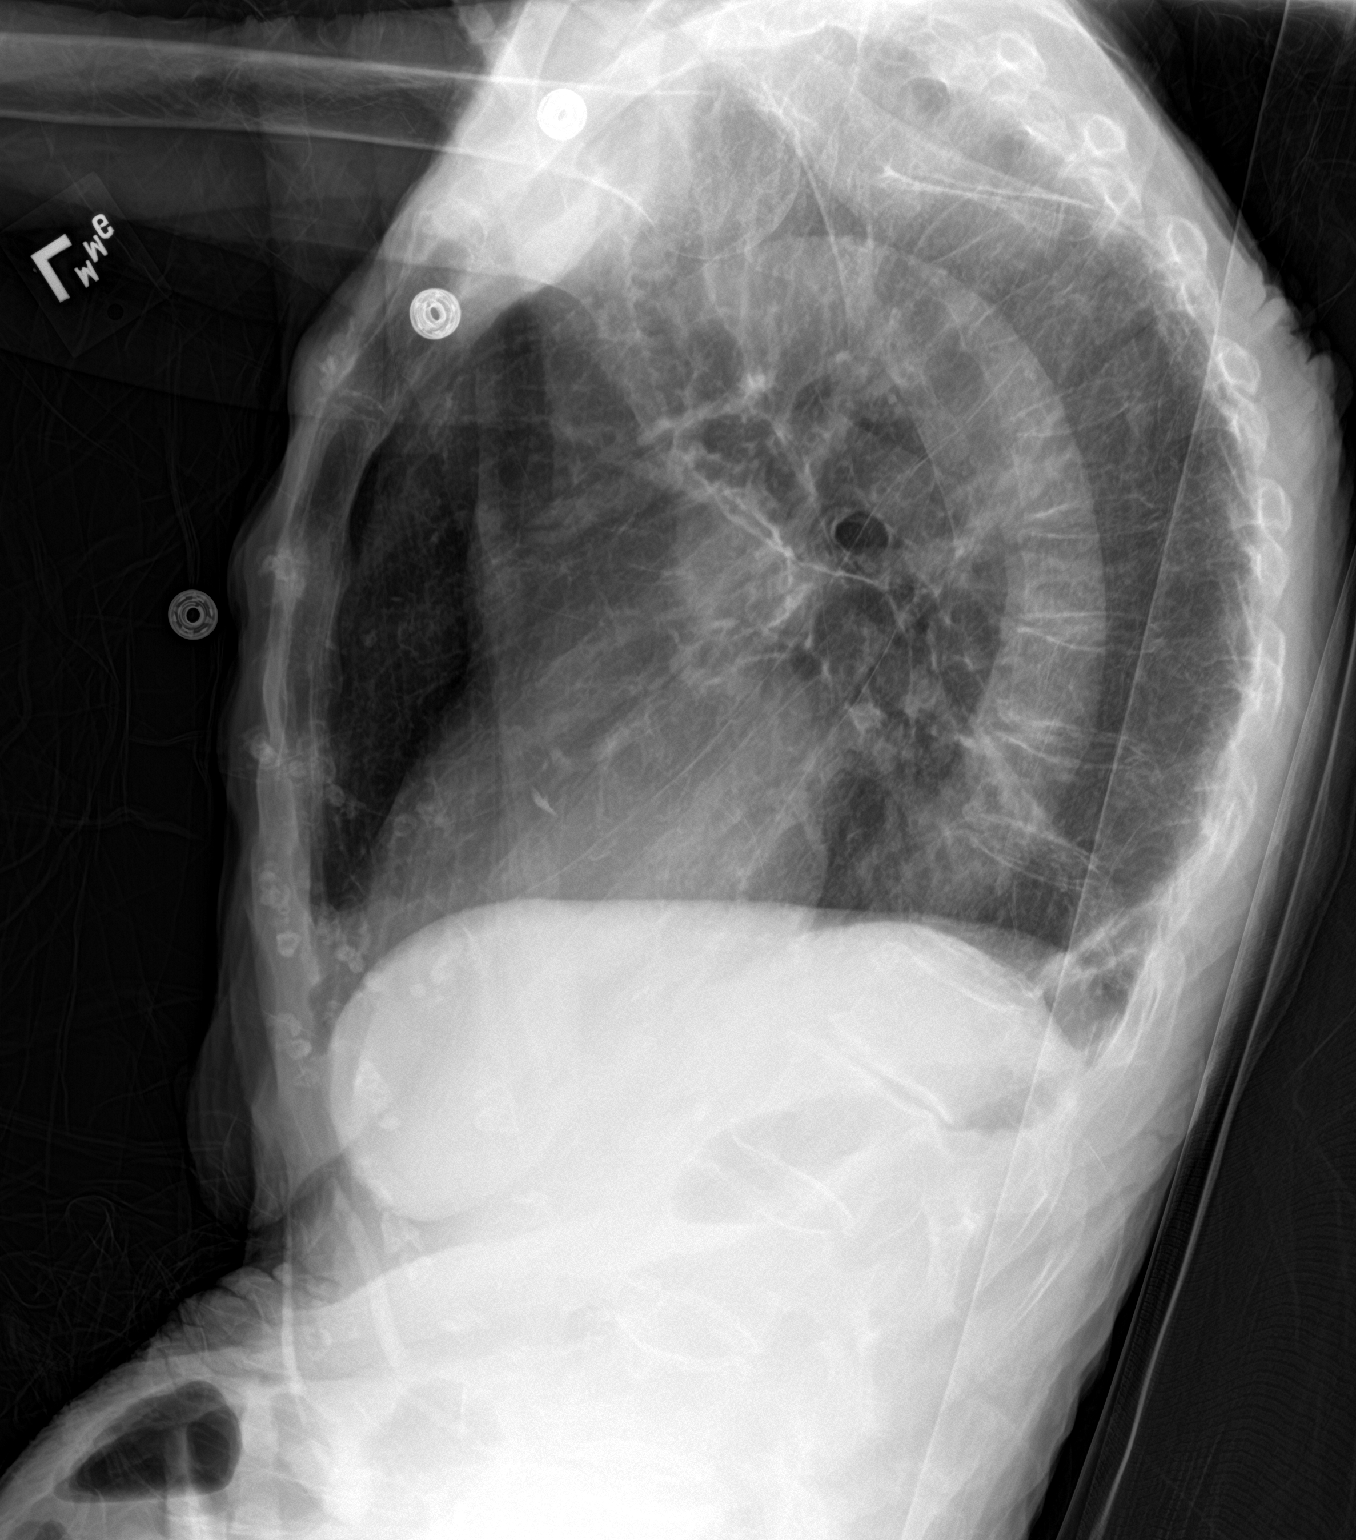

[chest ap]
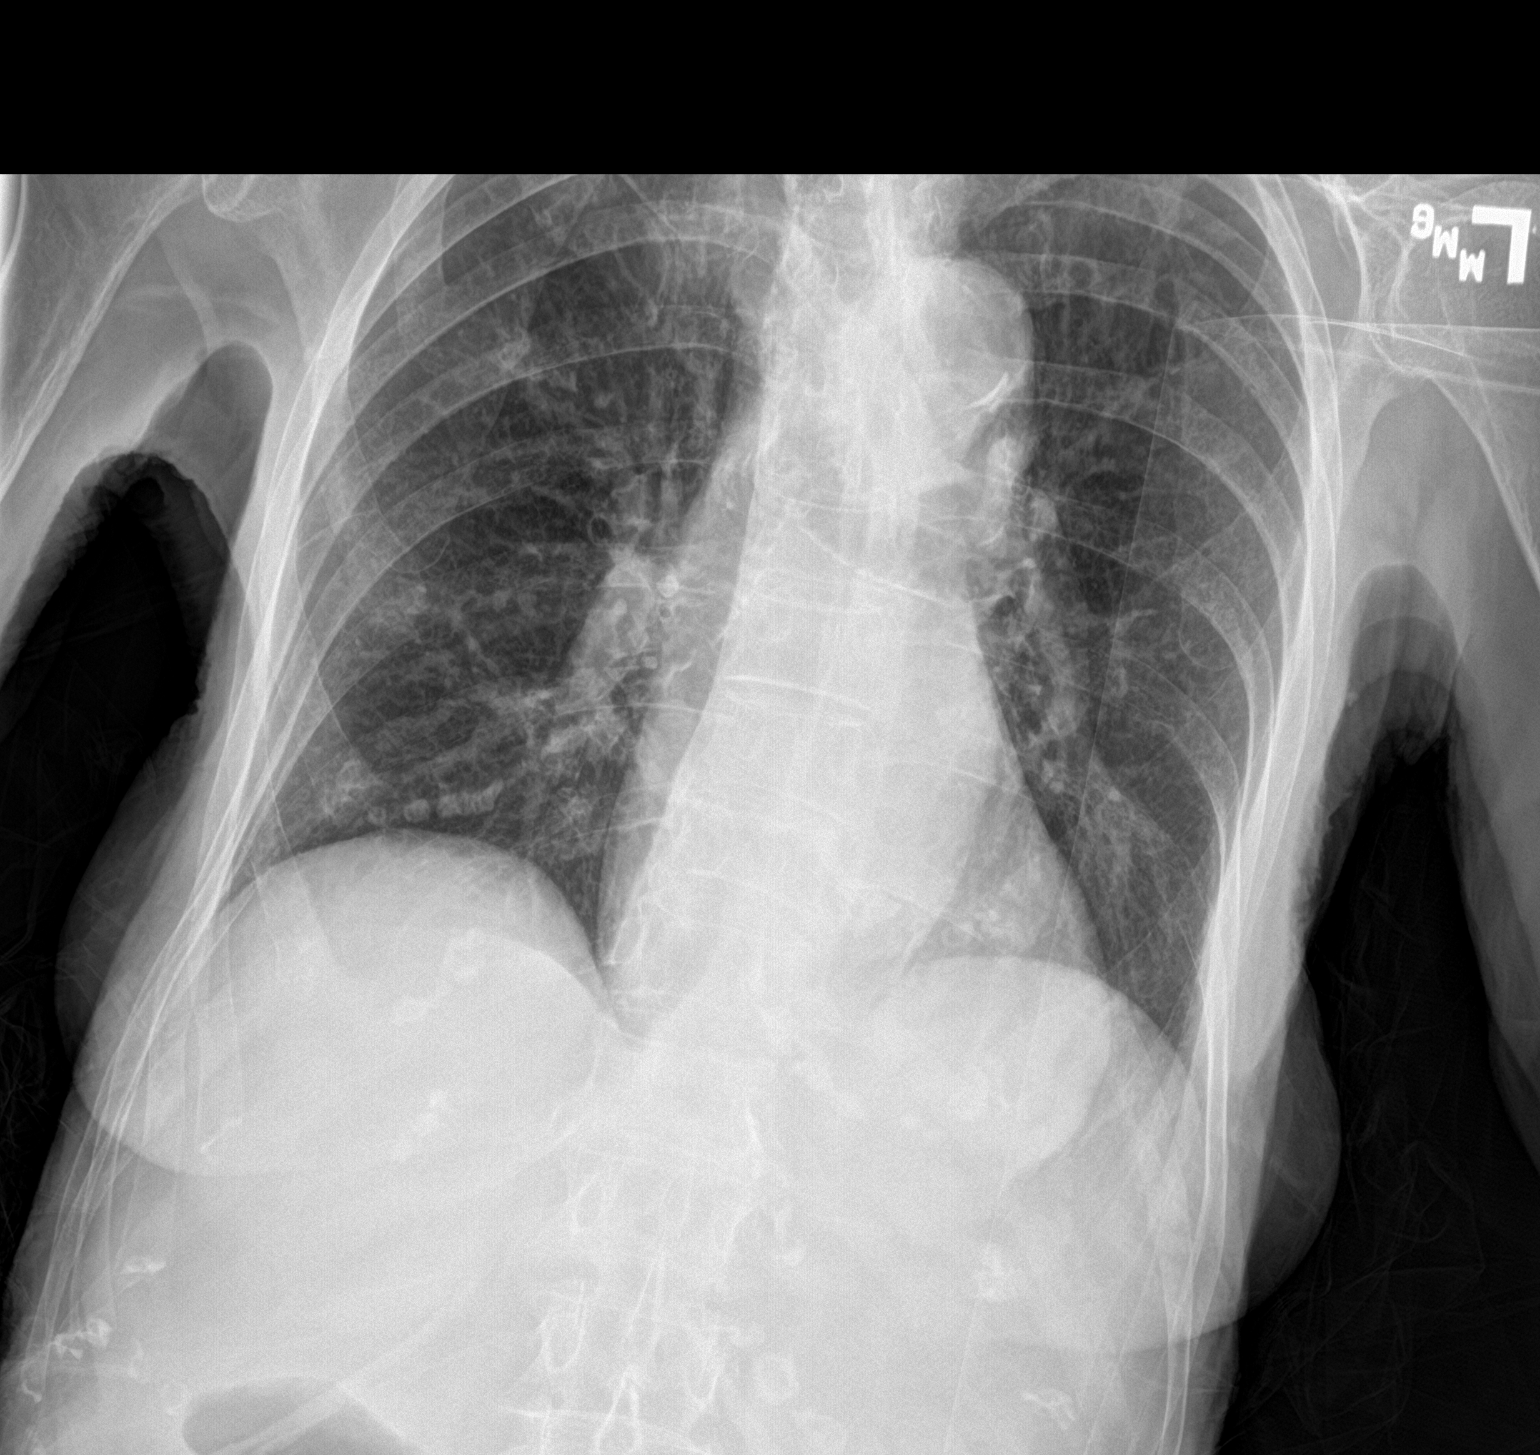

[2 of 2 positions shown; findings below may reference images not displayed]

FINDINGS: Mild cardiomegaly, stable. Mediastinal silhouette within normal
limits. Aortic atherosclerosis noted.

Lungs mildly hypoinflated. No focal infiltrates. No pulmonary edema
or pleural effusion. No pneumothorax.

No acute osseus abnormality. Diffuse osteopenia with exaggeration
the normal thoracic kyphosis.
IMPRESSION: 1. Shallow lung inflation with with no active cardiopulmonary
disease identified.
2. Aortic atherosclerosis.
